# Patient Record
Sex: Female | Born: 1965 | Race: White | Hispanic: No | Marital: Married | Smoking: Never smoker
Health system: Southern US, Community
[De-identification: ages and names within clinical notes are randomized; demographics above are authoritative.]

## PROBLEM LIST (undated history)

## (undated) DIAGNOSIS — K2960 Other gastritis without bleeding: Secondary | ICD-10-CM

## (undated) DIAGNOSIS — T39395A Adverse effect of other nonsteroidal anti-inflammatory drugs [NSAID], initial encounter: Secondary | ICD-10-CM

## (undated) DIAGNOSIS — M654 Radial styloid tenosynovitis [de Quervain]: Secondary | ICD-10-CM

## (undated) DIAGNOSIS — R740 Nonspecific elevation of levels of transaminase and lactic acid dehydrogenase [LDH]: Secondary | ICD-10-CM

## (undated) DIAGNOSIS — K219 Gastro-esophageal reflux disease without esophagitis: Secondary | ICD-10-CM

## (undated) DIAGNOSIS — M797 Fibromyalgia: Secondary | ICD-10-CM

## (undated) DIAGNOSIS — F419 Anxiety disorder, unspecified: Secondary | ICD-10-CM

## (undated) DIAGNOSIS — M5136 Other intervertebral disc degeneration, lumbar region: Secondary | ICD-10-CM

## (undated) DIAGNOSIS — D509 Iron deficiency anemia, unspecified: Secondary | ICD-10-CM

## (undated) DIAGNOSIS — R609 Edema, unspecified: Secondary | ICD-10-CM

## (undated) DIAGNOSIS — A689 Relapsing fever, unspecified: Secondary | ICD-10-CM

## (undated) DIAGNOSIS — Z8669 Personal history of other diseases of the nervous system and sense organs: Secondary | ICD-10-CM

## (undated) DIAGNOSIS — M51369 Other intervertebral disc degeneration, lumbar region without mention of lumbar back pain or lower extremity pain: Secondary | ICD-10-CM

## (undated) DIAGNOSIS — E669 Obesity, unspecified: Secondary | ICD-10-CM

## (undated) DIAGNOSIS — R634 Abnormal weight loss: Secondary | ICD-10-CM

## (undated) DIAGNOSIS — R0789 Other chest pain: Secondary | ICD-10-CM

## (undated) DIAGNOSIS — K623 Rectal prolapse: Secondary | ICD-10-CM

## (undated) DIAGNOSIS — G4700 Insomnia, unspecified: Secondary | ICD-10-CM

## (undated) DIAGNOSIS — N3010 Interstitial cystitis (chronic) without hematuria: Secondary | ICD-10-CM

## (undated) DIAGNOSIS — K589 Irritable bowel syndrome without diarrhea: Secondary | ICD-10-CM

## (undated) DIAGNOSIS — E042 Nontoxic multinodular goiter: Secondary | ICD-10-CM

## (undated) DIAGNOSIS — M8580 Other specified disorders of bone density and structure, unspecified site: Secondary | ICD-10-CM

## (undated) DIAGNOSIS — J189 Pneumonia, unspecified organism: Secondary | ICD-10-CM

## (undated) DIAGNOSIS — K625 Hemorrhage of anus and rectum: Secondary | ICD-10-CM

## (undated) DIAGNOSIS — N390 Urinary tract infection, site not specified: Secondary | ICD-10-CM

## (undated) DIAGNOSIS — J181 Lobar pneumonia, unspecified organism: Secondary | ICD-10-CM

## (undated) DIAGNOSIS — R110 Nausea: Secondary | ICD-10-CM

## (undated) HISTORY — DX: Edema, unspecified: R60.9

## (undated) HISTORY — DX: Hemorrhage of anus and rectum: K62.5

## (undated) HISTORY — DX: Radial styloid tenosynovitis (de quervain): M65.4

## (undated) HISTORY — DX: Adverse effect of other nonsteroidal anti-inflammatory drugs (NSAID), initial encounter: T39.395A

## (undated) HISTORY — DX: Fibromyalgia: M79.7

## (undated) HISTORY — DX: Urinary tract infection, site not specified: N39.0

## (undated) HISTORY — DX: Interstitial cystitis (chronic) without hematuria: N30.10

## (undated) HISTORY — DX: Insomnia, unspecified: G47.00

## (undated) HISTORY — DX: Lobar pneumonia, unspecified organism: J18.1

## (undated) HISTORY — DX: Gastro-esophageal reflux disease without esophagitis: K21.9

## (undated) HISTORY — DX: Pneumonia, unspecified organism: J18.9

## (undated) HISTORY — DX: Abnormal weight loss: R63.4

## (undated) HISTORY — DX: Personal history of other diseases of the nervous system and sense organs: Z86.69

## (undated) HISTORY — DX: Rectal prolapse: K62.3

## (undated) HISTORY — DX: Nausea: R11.0

## (undated) HISTORY — DX: Anxiety disorder, unspecified: F41.9

## (undated) HISTORY — DX: Other gastritis without bleeding: K29.60

## (undated) HISTORY — DX: Relapsing fever, unspecified: A68.9

## (undated) HISTORY — DX: Other intervertebral disc degeneration, lumbar region: M51.36

## (undated) HISTORY — DX: Other intervertebral disc degeneration, lumbar region without mention of lumbar back pain or lower extremity pain: M51.369

## (undated) HISTORY — PX: ABDOMINAL HYSTERECTOMY: SHX81

## (undated) HISTORY — DX: Nontoxic multinodular goiter: E04.2

## (undated) HISTORY — DX: Iron deficiency anemia, unspecified: D50.9

## (undated) HISTORY — PX: BUNIONECTOMY: SHX129

## (undated) HISTORY — DX: Obesity, unspecified: E66.9

## (undated) HISTORY — PX: RECTAL PROLAPSE REPAIR, RECTOPEXY: SHX2311

## (undated) HISTORY — DX: Other chest pain: R07.89

## (undated) HISTORY — DX: Other specified disorders of bone density and structure, unspecified site: M85.80

## (undated) HISTORY — PX: BREAST BIOPSY: SHX20

## (undated) HISTORY — DX: Nonspecific elevation of levels of transaminase and lactic acid dehydrogenase (ldh): R74.0

---

## 2001-01-08 ENCOUNTER — Other Ambulatory Visit: Admission: RE | Admit: 2001-01-08 | Discharge: 2001-01-08 | Payer: Self-pay | Admitting: *Deleted

## 2001-08-06 HISTORY — PX: BILATERAL SALPINGOOPHORECTOMY: SHX1223

## 2001-11-27 ENCOUNTER — Inpatient Hospital Stay (HOSPITAL_COMMUNITY): Admission: RE | Admit: 2001-11-27 | Discharge: 2001-11-30 | Payer: Self-pay | Admitting: Obstetrics & Gynecology

## 2002-06-11 ENCOUNTER — Ambulatory Visit (HOSPITAL_COMMUNITY): Admission: RE | Admit: 2002-06-11 | Discharge: 2002-06-11 | Payer: Self-pay | Admitting: Pediatrics

## 2002-06-11 ENCOUNTER — Encounter: Payer: Self-pay | Admitting: Pediatrics

## 2002-06-12 ENCOUNTER — Ambulatory Visit (HOSPITAL_COMMUNITY): Admission: RE | Admit: 2002-06-12 | Discharge: 2002-06-12 | Payer: Self-pay | Admitting: Pediatrics

## 2002-06-12 ENCOUNTER — Encounter: Payer: Self-pay | Admitting: Pediatrics

## 2002-12-25 ENCOUNTER — Ambulatory Visit (HOSPITAL_COMMUNITY): Admission: RE | Admit: 2002-12-25 | Discharge: 2002-12-25 | Payer: Self-pay | Admitting: Pediatrics

## 2002-12-25 ENCOUNTER — Encounter: Payer: Self-pay | Admitting: Pediatrics

## 2002-12-31 ENCOUNTER — Encounter: Payer: Self-pay | Admitting: Obstetrics and Gynecology

## 2002-12-31 ENCOUNTER — Ambulatory Visit (HOSPITAL_COMMUNITY): Admission: RE | Admit: 2002-12-31 | Discharge: 2002-12-31 | Payer: Self-pay | Admitting: Obstetrics and Gynecology

## 2003-04-13 ENCOUNTER — Encounter (HOSPITAL_COMMUNITY): Admission: RE | Admit: 2003-04-13 | Discharge: 2003-05-13 | Payer: Self-pay | Admitting: Pediatrics

## 2003-06-07 ENCOUNTER — Encounter
Admission: RE | Admit: 2003-06-07 | Discharge: 2003-09-05 | Payer: Self-pay | Admitting: Physical Medicine & Rehabilitation

## 2003-08-07 HISTORY — PX: CHOLECYSTECTOMY: SHX55

## 2003-09-23 ENCOUNTER — Encounter
Admission: RE | Admit: 2003-09-23 | Discharge: 2003-12-22 | Payer: Self-pay | Admitting: Physical Medicine & Rehabilitation

## 2004-01-04 ENCOUNTER — Ambulatory Visit (HOSPITAL_COMMUNITY): Admission: AD | Admit: 2004-01-04 | Discharge: 2004-01-04 | Payer: Self-pay | Admitting: Obstetrics & Gynecology

## 2004-03-08 ENCOUNTER — Ambulatory Visit (HOSPITAL_COMMUNITY): Admission: RE | Admit: 2004-03-08 | Discharge: 2004-03-08 | Payer: Self-pay | Admitting: Family Medicine

## 2004-03-13 ENCOUNTER — Encounter (INDEPENDENT_AMBULATORY_CARE_PROVIDER_SITE_OTHER): Payer: Self-pay | Admitting: *Deleted

## 2004-03-13 ENCOUNTER — Inpatient Hospital Stay (HOSPITAL_COMMUNITY): Admission: RE | Admit: 2004-03-13 | Discharge: 2004-03-15 | Payer: Self-pay | Admitting: Internal Medicine

## 2004-03-29 ENCOUNTER — Encounter
Admission: RE | Admit: 2004-03-29 | Discharge: 2004-03-31 | Payer: Self-pay | Admitting: Physical Medicine & Rehabilitation

## 2004-04-07 ENCOUNTER — Ambulatory Visit (HOSPITAL_COMMUNITY)
Admission: RE | Admit: 2004-04-07 | Discharge: 2004-04-07 | Payer: Self-pay | Admitting: Physical Medicine & Rehabilitation

## 2004-06-06 ENCOUNTER — Ambulatory Visit (HOSPITAL_COMMUNITY): Admission: RE | Admit: 2004-06-06 | Discharge: 2004-06-06 | Payer: Self-pay | Admitting: Internal Medicine

## 2004-06-06 ENCOUNTER — Encounter (INDEPENDENT_AMBULATORY_CARE_PROVIDER_SITE_OTHER): Payer: Self-pay | Admitting: *Deleted

## 2004-08-06 HISTORY — PX: ORIF METATARSAL FRACTURE: SUR942

## 2004-12-29 ENCOUNTER — Other Ambulatory Visit: Admission: RE | Admit: 2004-12-29 | Discharge: 2004-12-29 | Payer: Self-pay | Admitting: Obstetrics & Gynecology

## 2005-02-23 ENCOUNTER — Ambulatory Visit (HOSPITAL_COMMUNITY): Admission: RE | Admit: 2005-02-23 | Discharge: 2005-02-23 | Payer: Self-pay | Admitting: Orthopedic Surgery

## 2005-04-02 ENCOUNTER — Ambulatory Visit (HOSPITAL_COMMUNITY): Admission: RE | Admit: 2005-04-02 | Discharge: 2005-04-02 | Payer: Self-pay | Admitting: Obstetrics & Gynecology

## 2005-04-02 ENCOUNTER — Emergency Department (HOSPITAL_COMMUNITY): Admission: EM | Admit: 2005-04-02 | Discharge: 2005-04-02 | Payer: Self-pay | Admitting: Emergency Medicine

## 2005-04-04 ENCOUNTER — Ambulatory Visit: Payer: Self-pay | Admitting: Orthopedic Surgery

## 2005-04-12 ENCOUNTER — Ambulatory Visit: Payer: Self-pay | Admitting: Orthopedic Surgery

## 2005-04-30 ENCOUNTER — Ambulatory Visit: Payer: Self-pay | Admitting: Orthopedic Surgery

## 2005-05-21 ENCOUNTER — Ambulatory Visit: Payer: Self-pay | Admitting: Orthopedic Surgery

## 2005-05-30 ENCOUNTER — Ambulatory Visit: Payer: Self-pay | Admitting: Orthopedic Surgery

## 2005-05-31 ENCOUNTER — Ambulatory Visit (HOSPITAL_COMMUNITY): Admission: RE | Admit: 2005-05-31 | Discharge: 2005-05-31 | Payer: Self-pay | Admitting: Orthopedic Surgery

## 2005-06-05 ENCOUNTER — Ambulatory Visit: Payer: Self-pay | Admitting: Orthopedic Surgery

## 2005-06-05 ENCOUNTER — Ambulatory Visit (HOSPITAL_COMMUNITY): Admission: RE | Admit: 2005-06-05 | Discharge: 2005-06-05 | Payer: Self-pay | Admitting: Orthopedic Surgery

## 2005-06-11 ENCOUNTER — Ambulatory Visit: Payer: Self-pay | Admitting: Orthopedic Surgery

## 2005-06-18 ENCOUNTER — Ambulatory Visit: Payer: Self-pay | Admitting: Orthopedic Surgery

## 2005-06-21 ENCOUNTER — Ambulatory Visit: Payer: Self-pay | Admitting: Orthopedic Surgery

## 2005-07-02 ENCOUNTER — Ambulatory Visit: Payer: Self-pay | Admitting: Orthopedic Surgery

## 2005-07-06 ENCOUNTER — Ambulatory Visit (HOSPITAL_COMMUNITY): Admission: RE | Admit: 2005-07-06 | Discharge: 2005-07-06 | Payer: Self-pay | Admitting: Orthopedic Surgery

## 2005-07-06 ENCOUNTER — Ambulatory Visit (HOSPITAL_COMMUNITY): Admission: RE | Admit: 2005-07-06 | Discharge: 2005-07-06 | Payer: Self-pay | Admitting: Obstetrics & Gynecology

## 2005-07-18 ENCOUNTER — Ambulatory Visit: Payer: Self-pay | Admitting: Orthopedic Surgery

## 2005-08-08 ENCOUNTER — Ambulatory Visit: Payer: Self-pay | Admitting: Orthopedic Surgery

## 2005-09-24 ENCOUNTER — Ambulatory Visit: Payer: Self-pay | Admitting: Orthopedic Surgery

## 2005-10-29 ENCOUNTER — Ambulatory Visit: Payer: Self-pay | Admitting: Orthopedic Surgery

## 2005-12-03 ENCOUNTER — Ambulatory Visit: Payer: Self-pay | Admitting: Orthopedic Surgery

## 2006-09-03 ENCOUNTER — Ambulatory Visit (HOSPITAL_COMMUNITY): Payer: Self-pay | Admitting: Psychiatry

## 2006-09-10 ENCOUNTER — Ambulatory Visit (HOSPITAL_COMMUNITY): Payer: Self-pay | Admitting: Psychiatry

## 2006-10-11 ENCOUNTER — Ambulatory Visit (HOSPITAL_COMMUNITY): Payer: Self-pay | Admitting: Psychiatry

## 2006-10-25 ENCOUNTER — Ambulatory Visit (HOSPITAL_COMMUNITY): Payer: Self-pay | Admitting: Psychiatry

## 2007-05-01 ENCOUNTER — Emergency Department (HOSPITAL_COMMUNITY): Admission: EM | Admit: 2007-05-01 | Discharge: 2007-05-01 | Payer: Self-pay | Admitting: Emergency Medicine

## 2007-05-19 ENCOUNTER — Emergency Department (HOSPITAL_COMMUNITY): Admission: EM | Admit: 2007-05-19 | Discharge: 2007-05-19 | Payer: Self-pay | Admitting: Family Medicine

## 2007-07-09 ENCOUNTER — Ambulatory Visit (HOSPITAL_COMMUNITY): Admission: RE | Admit: 2007-07-09 | Discharge: 2007-07-09 | Payer: Self-pay | Admitting: Family Medicine

## 2007-07-10 ENCOUNTER — Ambulatory Visit (HOSPITAL_COMMUNITY): Admission: RE | Admit: 2007-07-10 | Discharge: 2007-07-10 | Payer: Self-pay | Admitting: Family Medicine

## 2008-02-05 ENCOUNTER — Encounter: Admission: RE | Admit: 2008-02-05 | Discharge: 2008-02-05 | Payer: Self-pay | Admitting: Internal Medicine

## 2008-07-28 ENCOUNTER — Encounter: Payer: Self-pay | Admitting: Orthopedic Surgery

## 2008-07-28 ENCOUNTER — Ambulatory Visit: Payer: Self-pay | Admitting: Orthopedic Surgery

## 2008-07-28 DIAGNOSIS — M25539 Pain in unspecified wrist: Secondary | ICD-10-CM

## 2008-07-28 DIAGNOSIS — M7750 Other enthesopathy of unspecified foot: Secondary | ICD-10-CM | POA: Insufficient documentation

## 2008-08-11 ENCOUNTER — Telehealth: Payer: Self-pay | Admitting: Orthopedic Surgery

## 2008-08-12 ENCOUNTER — Encounter: Payer: Self-pay | Admitting: Orthopedic Surgery

## 2008-08-16 ENCOUNTER — Encounter: Payer: Self-pay | Admitting: Orthopedic Surgery

## 2008-08-16 ENCOUNTER — Telehealth: Payer: Self-pay | Admitting: Orthopedic Surgery

## 2008-08-19 ENCOUNTER — Ambulatory Visit: Payer: Self-pay | Admitting: Orthopedic Surgery

## 2008-12-02 ENCOUNTER — Emergency Department (HOSPITAL_COMMUNITY): Admission: EM | Admit: 2008-12-02 | Discharge: 2008-12-02 | Payer: Self-pay | Admitting: Family Medicine

## 2009-01-25 ENCOUNTER — Emergency Department (HOSPITAL_COMMUNITY): Admission: EM | Admit: 2009-01-25 | Discharge: 2009-01-25 | Payer: Self-pay | Admitting: Family Medicine

## 2009-03-25 ENCOUNTER — Encounter (INDEPENDENT_AMBULATORY_CARE_PROVIDER_SITE_OTHER): Payer: Self-pay | Admitting: Urology

## 2009-03-25 ENCOUNTER — Ambulatory Visit (HOSPITAL_BASED_OUTPATIENT_CLINIC_OR_DEPARTMENT_OTHER): Admission: RE | Admit: 2009-03-25 | Discharge: 2009-03-25 | Payer: Self-pay | Admitting: Urology

## 2009-06-14 ENCOUNTER — Emergency Department (HOSPITAL_COMMUNITY): Admission: EM | Admit: 2009-06-14 | Discharge: 2009-06-14 | Payer: Self-pay | Admitting: Family Medicine

## 2009-11-22 ENCOUNTER — Emergency Department (HOSPITAL_COMMUNITY): Admission: EM | Admit: 2009-11-22 | Discharge: 2009-11-22 | Payer: Self-pay | Admitting: Family Medicine

## 2010-03-06 DIAGNOSIS — R7401 Elevation of levels of liver transaminase levels: Secondary | ICD-10-CM

## 2010-03-06 HISTORY — DX: Elevation of levels of liver transaminase levels: R74.01

## 2010-03-23 ENCOUNTER — Encounter (INDEPENDENT_AMBULATORY_CARE_PROVIDER_SITE_OTHER): Payer: Self-pay | Admitting: *Deleted

## 2010-04-01 ENCOUNTER — Emergency Department (HOSPITAL_COMMUNITY): Admission: EM | Admit: 2010-04-01 | Discharge: 2010-04-01 | Payer: Self-pay | Admitting: Family Medicine

## 2010-04-24 ENCOUNTER — Ambulatory Visit: Payer: Self-pay | Admitting: Gastroenterology

## 2010-04-24 ENCOUNTER — Encounter (INDEPENDENT_AMBULATORY_CARE_PROVIDER_SITE_OTHER): Payer: Self-pay | Admitting: *Deleted

## 2010-05-02 ENCOUNTER — Encounter: Payer: Self-pay | Admitting: Family Medicine

## 2010-05-02 ENCOUNTER — Ambulatory Visit (HOSPITAL_COMMUNITY): Admission: RE | Admit: 2010-05-02 | Discharge: 2010-05-02 | Payer: Self-pay | Admitting: Orthopaedic Surgery

## 2010-05-06 DIAGNOSIS — K2960 Other gastritis without bleeding: Secondary | ICD-10-CM

## 2010-05-06 HISTORY — DX: Other gastritis without bleeding: K29.60

## 2010-05-31 ENCOUNTER — Telehealth: Payer: Self-pay | Admitting: Gastroenterology

## 2010-06-01 ENCOUNTER — Ambulatory Visit: Payer: Self-pay | Admitting: Gastroenterology

## 2010-06-01 ENCOUNTER — Ambulatory Visit (HOSPITAL_COMMUNITY): Admission: RE | Admit: 2010-06-01 | Discharge: 2010-06-01 | Payer: Self-pay | Admitting: Gastroenterology

## 2010-06-01 DIAGNOSIS — K625 Hemorrhage of anus and rectum: Secondary | ICD-10-CM

## 2010-06-01 DIAGNOSIS — K219 Gastro-esophageal reflux disease without esophagitis: Secondary | ICD-10-CM

## 2010-06-01 HISTORY — DX: Hemorrhage of anus and rectum: K62.5

## 2010-06-01 HISTORY — DX: Gastro-esophageal reflux disease without esophagitis: K21.9

## 2010-06-01 LAB — HM COLONOSCOPY

## 2010-06-08 ENCOUNTER — Encounter: Payer: Self-pay | Admitting: Family Medicine

## 2010-07-10 ENCOUNTER — Ambulatory Visit: Payer: Self-pay | Admitting: Family Medicine

## 2010-07-10 DIAGNOSIS — R740 Nonspecific elevation of levels of transaminase and lactic acid dehydrogenase [LDH]: Secondary | ICD-10-CM

## 2010-07-10 DIAGNOSIS — R7402 Elevation of levels of lactic acid dehydrogenase (LDH): Secondary | ICD-10-CM | POA: Insufficient documentation

## 2010-07-10 DIAGNOSIS — M797 Fibromyalgia: Secondary | ICD-10-CM

## 2010-07-10 DIAGNOSIS — K219 Gastro-esophageal reflux disease without esophagitis: Secondary | ICD-10-CM | POA: Insufficient documentation

## 2010-07-10 DIAGNOSIS — F4321 Adjustment disorder with depressed mood: Secondary | ICD-10-CM

## 2010-07-10 DIAGNOSIS — R5382 Chronic fatigue, unspecified: Secondary | ICD-10-CM

## 2010-07-10 DIAGNOSIS — G9332 Myalgic encephalomyelitis/chronic fatigue syndrome: Secondary | ICD-10-CM | POA: Insufficient documentation

## 2010-07-10 DIAGNOSIS — G2581 Restless legs syndrome: Secondary | ICD-10-CM | POA: Insufficient documentation

## 2010-07-10 DIAGNOSIS — F411 Generalized anxiety disorder: Secondary | ICD-10-CM | POA: Insufficient documentation

## 2010-07-12 ENCOUNTER — Telehealth: Payer: Self-pay | Admitting: Family Medicine

## 2010-07-19 ENCOUNTER — Telehealth (INDEPENDENT_AMBULATORY_CARE_PROVIDER_SITE_OTHER): Payer: Self-pay | Admitting: *Deleted

## 2010-08-04 ENCOUNTER — Telehealth (INDEPENDENT_AMBULATORY_CARE_PROVIDER_SITE_OTHER): Payer: Self-pay | Admitting: *Deleted

## 2010-08-04 ENCOUNTER — Ambulatory Visit (HOSPITAL_COMMUNITY)
Admission: RE | Admit: 2010-08-04 | Discharge: 2010-08-04 | Payer: Self-pay | Source: Home / Self Care | Attending: Orthopaedic Surgery | Admitting: Orthopaedic Surgery

## 2010-08-04 ENCOUNTER — Ambulatory Visit
Admission: RE | Admit: 2010-08-04 | Discharge: 2010-08-04 | Payer: Self-pay | Source: Home / Self Care | Attending: Family Medicine | Admitting: Family Medicine

## 2010-08-04 ENCOUNTER — Encounter: Payer: Self-pay | Admitting: Family Medicine

## 2010-08-04 DIAGNOSIS — F329 Major depressive disorder, single episode, unspecified: Secondary | ICD-10-CM

## 2010-08-04 LAB — CONVERTED CEMR LAB
Glucose, Urine, Semiquant: NEGATIVE
Nitrite: NEGATIVE
Specific Gravity, Urine: 1.015
Urobilinogen, UA: 1
pH: 6

## 2010-08-15 ENCOUNTER — Telehealth: Payer: Self-pay | Admitting: Family Medicine

## 2010-08-16 ENCOUNTER — Encounter: Payer: Self-pay | Admitting: Family Medicine

## 2010-08-16 ENCOUNTER — Telehealth: Payer: Self-pay | Admitting: Family Medicine

## 2010-08-16 DIAGNOSIS — R634 Abnormal weight loss: Secondary | ICD-10-CM | POA: Insufficient documentation

## 2010-08-16 DIAGNOSIS — R5382 Chronic fatigue, unspecified: Secondary | ICD-10-CM | POA: Insufficient documentation

## 2010-08-16 DIAGNOSIS — A689 Relapsing fever, unspecified: Secondary | ICD-10-CM | POA: Insufficient documentation

## 2010-08-17 ENCOUNTER — Encounter: Payer: Self-pay | Admitting: Family Medicine

## 2010-08-22 ENCOUNTER — Ambulatory Visit (HOSPITAL_COMMUNITY)
Admission: RE | Admit: 2010-08-22 | Discharge: 2010-08-22 | Payer: Self-pay | Source: Home / Self Care | Attending: Family Medicine | Admitting: Family Medicine

## 2010-08-22 ENCOUNTER — Encounter: Payer: Self-pay | Admitting: Family Medicine

## 2010-08-22 LAB — HM MAMMOGRAPHY: HM Mammogram: NORMAL

## 2010-08-25 ENCOUNTER — Telehealth (INDEPENDENT_AMBULATORY_CARE_PROVIDER_SITE_OTHER): Payer: Self-pay | Admitting: *Deleted

## 2010-08-26 ENCOUNTER — Encounter: Payer: Self-pay | Admitting: Obstetrics & Gynecology

## 2010-08-27 ENCOUNTER — Encounter: Payer: Self-pay | Admitting: Orthopedic Surgery

## 2010-08-27 ENCOUNTER — Encounter: Payer: Self-pay | Admitting: Obstetrics & Gynecology

## 2010-08-27 ENCOUNTER — Encounter: Payer: Self-pay | Admitting: Family Medicine

## 2010-08-27 ENCOUNTER — Encounter: Payer: Self-pay | Admitting: Internal Medicine

## 2010-08-29 ENCOUNTER — Encounter: Payer: Self-pay | Admitting: Family Medicine

## 2010-08-29 ENCOUNTER — Ambulatory Visit (HOSPITAL_COMMUNITY): Admit: 2010-08-29 | Payer: Self-pay | Admitting: Psychiatry

## 2010-08-29 ENCOUNTER — Telehealth (INDEPENDENT_AMBULATORY_CARE_PROVIDER_SITE_OTHER): Payer: Self-pay | Admitting: *Deleted

## 2010-08-31 ENCOUNTER — Ambulatory Visit (HOSPITAL_COMMUNITY): Admit: 2010-08-31 | Payer: Self-pay | Admitting: Psychiatry

## 2010-08-31 ENCOUNTER — Telehealth: Payer: Self-pay | Admitting: Family Medicine

## 2010-08-31 ENCOUNTER — Encounter: Payer: Self-pay | Admitting: Family Medicine

## 2010-09-01 ENCOUNTER — Telehealth (INDEPENDENT_AMBULATORY_CARE_PROVIDER_SITE_OTHER): Payer: Self-pay | Admitting: *Deleted

## 2010-09-01 ENCOUNTER — Encounter: Payer: Self-pay | Admitting: Family Medicine

## 2010-09-01 LAB — CONVERTED CEMR LAB
BUN: 5 mg/dL — ABNORMAL LOW (ref 6–23)
CO2: 24 meq/L (ref 19–32)
Calcium: 8.9 mg/dL (ref 8.4–10.5)
HCT: 36.4 % (ref 36.0–46.0)
Hemoglobin: 11.6 g/dL — ABNORMAL LOW (ref 12.0–15.0)
Lymphocytes Relative: 17 % (ref 12–46)
Lymphs Abs: 1.7 10*3/uL (ref 0.7–4.0)
MCHC: 31.9 g/dL (ref 30.0–36.0)
Neutrophils Relative %: 77 % (ref 43–77)
Osmolality: 273 mOsm/kg — ABNORMAL LOW (ref 275–300)
RBC: 4.09 M/uL (ref 3.87–5.11)
WBC: 9.9 10*3/uL (ref 4.0–10.5)

## 2010-09-04 ENCOUNTER — Encounter: Payer: Self-pay | Admitting: Family Medicine

## 2010-09-04 ENCOUNTER — Ambulatory Visit
Admission: RE | Admit: 2010-09-04 | Discharge: 2010-09-04 | Payer: Self-pay | Source: Home / Self Care | Attending: Family Medicine | Admitting: Family Medicine

## 2010-09-05 NOTE — Letter (Signed)
Summary: [facility] Instructions  [facility]  [address] [place], [place] [address]   Phone: [phone]  Fax: [phone]       Alexandra Reid    [birth year]    MRN: [id number]        Procedure Day /Date:06/01/10 THURS     Arrival Time:730 am     Procedure Time:930 am     Location of Procedure:                     X  [facility] ( Outpatient Registration)                        PREPARATION FOR COLONOSCOPY WITH MOVIPREP   Starting 5 days prior to your procedure 05/27/10 do not eat nuts, seeds, popcorn, corn, beans, peas,  salads, or any raw vegetables.  Do not take any fiber supplements (e.g. Metamucil, Citrucel, and Benefiber).  THE DAY BEFORE YOUR PROCEDURE         DATE: 05/31/10  DAY: WED  1.  Drink clear liquids the entire day-NO SOLID FOOD  2.  Do not drink anything colored red or purple.  Avoid juices with pulp.  No orange juice.  3.  Drink at least 64 oz. (8 glasses) of fluid/clear liquids during the day to prevent dehydration and help the prep work efficiently.  CLEAR LIQUIDS INCLUDE: Water Jello Ice Popsicles Tea (sugar ok, no milk/cream) Powdered fruit flavored drinks Coffee (sugar ok, no milk/cream) Gatorade Juice: apple, white grape, white cranberry  Lemonade Clear bullion, consomm, broth Carbonated beverages (any kind) Strained chicken noodle soup Hard Candy                             4.  In the morning, mix first dose of MoviPrep solution:    Empty 1 Pouch A and 1 Pouch B into the disposable container    Add lukewarm drinking water to the top line of the container. Mix to dissolve    Refrigerate (mixed solution should be used within 24 hrs)  5.  Begin drinking the prep at 5:00 p.m. The MoviPrep container is divided by 4 marks.   Every 15 minutes drink the solution down to the next mark (approximately 8 oz) until the full liter is complete.   6.  Follow completed prep with 16 oz of clear liquid of your choice  (Nothing red or purple).  Continue to drink clear liquids until bedtime.  7.  Before going to bed, mix second dose of MoviPrep solution:    Empty 1 Pouch A and 1 Pouch B into the disposable container    Add lukewarm drinking water to the top line of the container. Mix to dissolve    Refrigerate  THE DAY OF YOUR PROCEDURE      DATE: 06/01/10 DAY: THURS  Beginning at 430 a.m. (5 hours before procedure):         1. Every 15 minutes, drink the solution down to the next mark (approx 8 oz) until the full liter is complete.  NOTHING TO EAT OR DRINK AFTER MIDNIGHT  MEDICATION INSTRUCTIONS  Unless otherwise instructed, you should take regular prescription medications with a small sip of water   as early as possible the morning of your procedure.         OTHER INSTRUCTIONS  You will need a responsible adult at least 46 years of age  to accompany you and drive you home.   This person must remain in the waiting room during your procedure.  Wear loose fitting clothing that is easily removed.  Leave jewelry and other valuables at home.  However, you may wish to bring a book to read or  an iPod/MP3 player to listen to music as you wait for your procedure to start.  Remove all body piercing jewelry and leave at home.  Total time from sign-in until discharge is approximately 2-3 hours.  You should go home directly after your procedure and rest.  You can resume normal activities the  day after your procedure.  The day of your procedure you should not:   Drive   Make legal decisions   Operate machinery   Drink alcohol   Return to work  You will receive specific instructions about eating, activities and medications before you leave.    The above instructions have been reviewed and explained to me by   _______________________    I fully understand and can verbalize these instructions _____________________________ Date _________  Appended Document: Moviprep  Instructions    Clinical Lists Changes  Medications: Removed medication of NABUMETONE 500 MG TABS (NABUMETONE) one by mouth bid

## 2010-09-05 NOTE — Letter (Signed)
Summary: New Patient letter  [facility]  [address] [place], [place] [address]   Phone: [phone]  Fax: [phone]       03/23/2010 MRN: [id number]  [facility] [address] [place], [place]  [address]  Dear Ms. Alexandra Reid,  Welcome to the Gastroenterology Division at [facility].    You are scheduled to see Dr.  Rob Bunting on April 24, 2010 at 10:30am on the 3rd floor at [facility], [address]  We ask that you try to arrive at our office 15 minutes prior to your appointment time to allow for check-in.  We would like you to complete the enclosed self-administered evaluation form prior to your visit and bring it with you on the day of your appointment.  We will review it with you.  Also, please bring a complete list of all your medications or, if you prefer, bring the medication bottles and we will list them.  Please bring your insurance card so that we may make a copy of it.  If your insurance requires a referral to see a specialist, please bring your referral form from your primary care physician.  Co-payments are due at the time of your visit and may be paid by cash, check or credit card.     Your office visit will consist of a consult with your physician (includes a physical exam), any laboratory testing he/she may order, scheduling of any necessary diagnostic testing (e.g. x-ray, ultrasound, CT-scan), and scheduling of a procedure (e.g. Endoscopy, Colonoscopy) if required.  Please allow enough time on your schedule to allow for any/all of these possibilities.    If you cannot keep your appointment, please call [phone] to cancel or reschedule prior to your appointment date.  This allows [place] the opportunity to schedule an appointment for another patient in need of care.  If you do not cancel or reschedule by 5 p.m. the business day prior to your appointment date, you will be charged a $50.00 late cancellation/no-show fee.    Thank you for  choosing [facility] for your medical needs.  We appreciate the opportunity to care for you.  Please visit [place] at our website  to learn more about our practice.                     Sincerely,                                                             The Gastroenterology Division
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
## 2010-09-05 NOTE — Procedures (Signed)
Summary: Colonoscopy  Patient: Alexandra Reid Note: All result statuses are Final unless otherwise noted.  Tests: (1) Colonoscopy (COL)   COL Colonoscopy           DONE     [facility]     [address] [place], [place]  [address]           COLONOSCOPY PROCEDURE REPORT           PATIENT:  Alexandra, Reid  MR#:  [id number]     BIRTHDATE:  March 01, 1966, 44 yrs. old  GENDER:  female     ENDOSCOPIST:  Rachael Fee, MD     REF. BY:  Lucky Cowboy, M.D.     PROCEDURE DATE:  06/01/2010     PROCEDURE:  Diagnostic Colonoscopy     ASA CLASS:  Class II     INDICATIONS:  minor intermittent rectal bleeding, not anemic     MEDICATIONS:   MAC sedation, administered by CRNA           DESCRIPTION OF PROCEDURE:   After the risks benefits and     alternatives of the procedure were thoroughly explained, informed     consent was obtained.  Digital rectal exam was performed and     revealed no abnormalities.   The Pentax Colonoscope [id number]     endoscope was introduced through the anus and advanced to the     cecum, which was identified by both the appendix and ileocecal     valve, without limitations.  The quality of the prep was good,     using MoviPrep.  The instrument was then slowly withdrawn as the     colon was fully examined.     <<PROCEDUREIMAGES>>           FINDINGS:  Internal hemorrhoids were found. These were small and     not thrombosed.  This was otherwise a normal examination of the     colon (see image001, image002, and image003).   Retroflexed views     in the rectum revealed no abnormalities.    The scope was then     withdrawn from the patient and the procedure completed.           COMPLICATIONS:  None           ENDOSCOPIC IMPRESSION:     1) Small internal hemorrhoids     2) Otherwise normal examination; no polyps or cancers           RECOMMENDATIONS:     1) Continue current colorectal screening recommendations for     "routine risk" patients with a repeat  colonoscopy in 10 years.     2) Bleeding is minor and most likely hemorrhoidal.           REPEAT EXAM:  10 years           ______________________________     Rachael Fee, MD           n.     eSIGNED:   Rachael Fee at 06/01/2010 09:40 AM           Alexandra Reid, [id number]  Note: An exclamation mark (!) indicates a result that was not dispersed into the flowsheet. Document Creation Date: 06/01/2010 9:40 AM _______________________________________________________________________  (1) Order result status: Final Collection or observation date-time: 06/01/2010 09:34 Requested date-time:  Receipt date-time:  Reported date-time:  Referring Physician:   Ordering Physician: Rob Bunting [phone]) Specimen  Source:  Source: Launa Grill Order Number: [id number] Lab site:   Appended Document: Colonoscopy patty, she needs recall colonoscoyp in 10 years  Appended Document: Colonoscopy recall in IDX and EMR   Clinical Lists Changes  Observations: Added new observation of COLONNXTDUE: 05/2020 (06/01/2010 10:20)

## 2010-09-05 NOTE — Procedures (Signed)
Summary: Preparation for Colonoscopy / [facility]  Preparation for Colonoscopy / [facility]   Imported By: Lennie Odor 04/25/2010 14:59:20  _____________________________________________________________________  External Attachment:    Type:   Image     Comment:   External Document
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
## 2010-09-05 NOTE — Procedures (Signed)
Summary: EGD  ENDOSCOPY PROCEDURE REPORT  PATIENT:  Alexandra Reid, Alexandra Reid  MR#:  [id number] BIRTHDATE:   Jan 08, 1966, 44 yrs. old   GENDER:   female ENDOSCOPIST:   Rachael Fee, MD PROCEDURE DATE:  06/01/2010 PROCEDURE:  EGD with biopsy, 43239 ASA CLASS:   Class II INDICATIONS: chronic GERD, under better control since changing to nexium BID shortly before BF and dinner meals (able to stop QHS pepcid) MEDICATIONS:   MAC sedation, administered by CRNA TOPICAL ANESTHETIC:   Vicous Xylocaine  DESCRIPTION OF PROCEDURE:   After the risks benefits and alternatives of the procedure were thoroughly explained, informed consent was obtained.  The  endoscope was introduced through the mouth and advanced to the second portion of the duodenum, without limitations.  The instrument was slowly withdrawn as the mucosa was fully examined.  <<PROCEDUREIMAGES>>          <<OLD IMAGES>> There was moderate, linear, spoke-like gastritis particularly in body of esophagus. More consistent with NSAIDs than GAVE. Biopsies taken to check for H. pylori (see image4).  Otherwise the examination was normal (see image7, image5, image3, image2, and image1).    Retroflexed views revealed no abnormalities.    The scope was then withdrawn from the patient and the procedure completed. COMPLICATIONS:   None  ENDOSCOPIC IMPRESSION:  1) Moderate gastritis, checked for H. pylori.  I suspect NSAID related changes.  2) Otherwise normal examination.  RECOMMENDATIONS:  If biopsies show H. pylori, she will be started on the appropriate antibiotics.  Try to avoid NSAIDs as best as possible.  Continue nexium twice daily since it is helping your symptoms.   _______________________________ Rachael Fee, MD

## 2010-09-05 NOTE — Progress Notes (Signed)
Summary: Triage  Phone Note Call from Patient Call back at [phone]   Caller: Patient Call For: Dr. Christella Hartigan Reason for Call: Talk to Nurse Summary of Call: pt. thought her COL was today instead of tomorrow at the hosp...Marland KitchenMarland KitchenShe has done the prep/Moviprep last night and this morning.Marland Kitchen...what does she need to do? Initial call taken by: Karna Christmas,  May 31, 2010 8:14 AM  Follow-up for Phone Call        Dr Christella Hartigan please advise.  Consulted Dr Christella Hartigan she is to use 1/2 of a Miralax prep tonight.  Pt aware and sent to pharmacy Follow-up by: Chales Abrahams CMA Duncan Dull),  May 31, 2010 8:25 AM  Additional Follow-up for Phone Call Additional follow up Details #1::        ok Additional Follow-up by: Rachael Fee MD,  May 31, 2010 8:46 AM    New/Updated Medications: MIRALAX   POWD (POLYETHYLENE GLYCOL 3350) as directed pt to use only 1/2 the miralax. Prescriptions: MIRALAX   POWD (POLYETHYLENE GLYCOL 3350) as directed pt to use only 1/2 the miralax.  #255gm x 0   Entered by:   Chales Abrahams CMA (AAMA)   Authorized by:   Rachael Fee MD   Signed by:   Rachael Fee MD on 05/31/2010   Method used:   Electronically to        [facility]  [address] [address] [id number] (retail)       [address]       [place], [place]  [address]       Ph: [phone] or [phone]       Fax: [phone]   RxID:   [id number]

## 2010-09-07 ENCOUNTER — Telehealth: Payer: Self-pay | Admitting: Family Medicine

## 2010-09-07 NOTE — Miscellaneous (Signed)
  Clinical Lists Changes     MLI_PICT  LTDinsletter To whom it may concern,           As Alexandra Reid primary care physician, I have been asked by her to write a brief statement to update her medical situation.  She has been suffering from unexplained febrile illnesses every 7-10d, characterized by said fever, increased body pain, headaches, and extreme fatigue.  These periods incapacitate her for an average of 3 days.  We have a reasonably high suspicion of malignancy so we are pursuing a medical investigation accordingly.      Sincerely,   Santiago Bumpers, MD
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
## 2010-09-07 NOTE — Progress Notes (Signed)
Summary: OV Notes to insurance co  Phone Note Call from Patient Call back at [phone]   Caller: Patient Summary of Call: Pls fax today's OV notes to her insurance co Initial call taken by: Lannette Donath,  August 04, 2010 3:07 PM  Follow-up for Phone Call        OV note completed and notes faxed to [facility]--fax # [phone]  Follow-up by: Francee Piccolo CMA (AAMA),  August 08, 2010 11:09 AM

## 2010-09-07 NOTE — Miscellaneous (Signed)
  Clinical Lists Changes  Problems: Added new problem of HYPONATREMIA (ICD-276.1) Orders: Added new Test order of T-Basic Metabolic Panel [phone]) - Signed Added new Test order of T-CBC w/Diff [phone]) - Signed Added new Test order of T-Culture, Blood Routine ([id number]) - Signed Added new Test order of T-Blood Osmolality ([id number]) - Signed Added new Test order of T-Blood Smear Interpretation ([id number]) - Signed Added new Service order of (3170F) FLOW CYTOMETRY STUDIES PERF AT DX OR PRIOR TO TX ([id number]) - Signed Added new Test order of T-Culture, Blood Routine ([id number]) - Signed

## 2010-09-07 NOTE — Letter (Signed)
Summary: [facility]   Imported By: Lester  07/27/2010 09:41:54  _____________________________________________________________________  External Attachment:    Type:   Image     Comment:   External Document

## 2010-09-07 NOTE — Progress Notes (Signed)
Summary: Labwork  Phone Note Call from Patient Call back at Home Phone [phone]   Caller: Patient Reason for Call: Talk to Nurse Summary of Call: Pt said she is sick today, she wants to know if this would be a good day to go have her labwork done, pls call Initial call taken by: Lannette Donath,  August 29, 2010 1:54 PM  Follow-up for Phone Call        pt has fever 102.6, she had to cancel appt with behavioral health until Thursday. pt wants to know if a fever would have any effect on lab results and if today would be a good day to have them drawn.  Please advise. Follow-up by: Francee Piccolo CMA Duncan Dull),  August 29, 2010 3:08 PM  Additional Follow-up for Phone Call Additional follow up Details #1::        Go ahead and get labs if she feels like she can make it to the lab.  If she feels too bad, it is fine to hold off until feeling better/no fever. Additional Follow-up by: Michell Heinrich M.D.,  August 29, 2010 3:14 PM    Additional Follow-up for Phone Call Additional follow up Details #2::    pt notified could be beneficial to go today.  She will go today. Follow-up by: Francee Piccolo CMA Duncan Dull),  August 29, 2010 3:48 PM

## 2010-09-07 NOTE — Progress Notes (Signed)
  Phone Note Other Incoming   Summary of Call: Talked to Dr. Consuella Lose on phone today regarding recent labs (08/04/10)--ESR 40 and CK total 356, o/w normal---and she can't explain the CK elevation but thinks the fever&malaise episodes + increased ESR warrant further w/u for malignancy.  Regarding the CK, she plans to repeat this in 8mo and if still up they will do EMG and NCS.  Will contact pt and discuss further w/u for malignancy. Initial call taken by: Michell Heinrich M.D.,  August 15, 2010 3:54 PM

## 2010-09-07 NOTE — Letter (Signed)
Summary: Generic Letter  [facility]  [address]   [place], [place] [address]   Phone: [phone]  Fax: [phone]    08/17/10  To whom it may concern,           As Alexandra Reid primary care physician, I have been asked by her to write a brief statement to update her medical situation.  She has been suffering from unexplained febrile illnesses every 7-10d, characterized by said fever, increased body pain, headaches, and extreme fatigue.  These periods incapacitate her for an average of 3 days.  We have a reasonably high suspicion of malignancy so we are pursuing a medical investigation accordingly.08/17/2010   Sincerely,   Nicoletta Ba M.D.

## 2010-09-07 NOTE — Progress Notes (Signed)
Summary: Lab results  Phone Note Other Incoming   Summary of Call: I talked to her about her latest lab results.  I want her to go back to [facility] for more labs ASAP: I'll print out order: BMET, serum osmolality, CBC w/diff, peripheral blood smear, flow cytometry, and blood culture x 2.  Dx: fever of unknown origin, hyponatremia. Initial call taken by: Michell Heinrich M.D.,  August 31, 2010 10:28 AM  Follow-up for Phone Call        lab orders faxed to [phone]. Follow-up by: Francee Piccolo CMA Duncan Dull),  August 31, 2010 10:40 AM

## 2010-09-07 NOTE — Miscellaneous (Signed)
  Clinical Lists Changes  Observations: Added new observation of FAMILY HX: grandfather had colon cancer Father: testicular cancer,  Mother: Picks disease (dementia) (08/22/2010 15:47) Added new observation of PAST MED HX: Lumbar DDD Chronic Headaches Anxiety Disorder Fibromyalgia (Dr. Consuella Lose [phone]) Interstitial Cystitis obesity Insomnia Mild transaminasemia 03/2010 (ALT 49), normal on f/u testing off of daily tylenol use. Dyspepsia/gastritis/GERD: EGD 06/01/2010: gastritis (NSAIDs) Anorectal bleeding/BRBPR--Colonoscopy 06/01/2010: internal hemorrhoids Recurrent UTIs: renal u/s normal 07/2007 Suspicion of malignancy 08/2010 based on approx 1-2 mo of wt loss, intermittent episodes of fever and worsened musculoskeletal pain, elevated ESR and otherwise negative autoimmune/inflammitory testing:  CT abd/pelv 08/22/10  (tiny right lung nodules x 3, mild biliary dilatation c/w physiologic change s/p cholecystectomy.  Per radiologist, f/u of lung nodules NOT recommended since she is NOT high risk for lung cancer.) CXR 08/22/10 nl except mild chronic bronchitic changes   (08/22/2010 15:47) Added new observation of PRIMARY MD: Aneta Mins McGowen,MD (08/22/2010 15:47)       Past History:  Past Medical History: Lumbar DDD Chronic Headaches Anxiety Disorder Fibromyalgia (Dr. Consuella Lose [phone]) Interstitial Cystitis obesity Insomnia Mild transaminasemia 03/2010 (ALT 49), normal on f/u testing off of daily tylenol use. Dyspepsia/gastritis/GERD: EGD 06/01/2010: gastritis (NSAIDs) Anorectal bleeding/BRBPR--Colonoscopy 06/01/2010: internal hemorrhoids Recurrent UTIs: renal u/s normal 07/2007 Suspicion of malignancy 08/2010 based on approx 1-2 mo of wt loss, intermittent episodes of fever and worsened musculoskeletal pain, elevated ESR and otherwise negative autoimmune/inflammitory testing:  CT abd/pelv 08/22/10  (tiny right lung nodules x 3, mild biliary dilatation c/w physiologic  change s/p cholecystectomy.  Per radiologist, f/u of lung nodules NOT recommended since she is NOT high risk for lung cancer.) CXR 08/22/10 nl except mild chronic bronchitic changes   Family History: grandfather had colon cancer Father: testicular cancer,  Mother: Picks disease (dementia)

## 2010-09-07 NOTE — Progress Notes (Signed)
Summary: Labs  Phone Note Other Incoming   Summary of Call: Please call [facility] in [place] and find out exactly what labs are already ordered for [facility] (I know that lyme, EBV, and parvo titers are ordered, but want to make sure CMET, CPK total, CBC w/diff, ESR, and lipase are also going to be done). Initial call taken by: Michell Heinrich M.D.,  August 25, 2010 8:17 AM  Follow-up for Phone Call        LM to [facility] at home number Francee Piccolo CMA Duncan Dull)  August 25, 2010 4:05 PM  pt is going for labs on Tuesday.  Will need order faxed for all labs you are requesting.  [facility] from [facility]. Follow-up by: Francee Piccolo CMA Duncan Dull),  August 25, 2010 4:13 PM  Additional Follow-up for Phone Call Additional follow up Details #1::        Printed labs. Additional Follow-up by: Michell Heinrich M.D.,  August 25, 2010 4:38 PM    Additional Follow-up for Phone Call Additional follow up Details #2::    lab orders faxed to [facility] Follow-up by: Francee Piccolo CMA Duncan Dull),  August 28, 2010 3:41 PM
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
## 2010-09-07 NOTE — Letter (Signed)
Summary: [facility]   Imported By: Lester [place] 07/27/2010 09:40:46  _____________________________________________________________________  External Attachment:    Type:   Image     Comment:   External Document

## 2010-09-07 NOTE — Progress Notes (Signed)
Summary: Pt needs notes & labs for life insurance policy  Phone Note Call from Patient   Caller: Patient Summary of Call: Pt needs medical history sent to her insurance co, pt is requesting most recent OV notes and Oct 2011 OV notes & labs to [facility] fax [phone] Cecil Cobbs , pt has filled out a release form for the insurance co, pt will request insurance co to fax it to our office Initial call taken by: Lannette Donath,  July 19, 2010 4:12 PM  Follow-up for Phone Call        I spoke with pt and informed her that I would fax the ov notes from the December appt but she would need to contact the other office that Dr Milinda Cave was at for the October visit and labs. Pt states she understands. Follow-up by: Josph Macho RMA,  July 19, 2010 4:20 PM  Additional Follow-up for Phone Call Additional follow up Details #1::        Pt informed that MD printed paperwork for pt to pick up. Pt states she will wait until her appt in January. Additional Follow-up by: Josph Macho RMA,  July 21, 2010 7:50 AM     Appended Document: Pt needs notes & labs for life insurance policy Left mess on pt's home # to [employer], Dr Milinda Cave needs medical records from Dr Magnus Ivan before life insurance forms can be sent, pt can either come by [facility] to sign medical record request or can contact Dr Eliberto Ivory office directly  Appended Document: Pt needs notes & labs for life insurance policy SW pt, she has already requested Dr Eliberto Ivory office to send the records directly to the insurance co, advised pt that Dr Milinda Cave also needs records for Dr Eliberto Ivory office, she will contact Dr Eliberto Ivory office to request records  Appended Document: Pt needs notes & labs for life insurance policy Faxed OV notes & cover letter to [employer] [phone]

## 2010-09-07 NOTE — Letter (Signed)
Summary: [facility] records  [facility] records   Imported By: Lester Tibbie 08/08/2010 07:59:46  _____________________________________________________________________  External Attachment:    Type:   Image     Comment:   External Document

## 2010-09-07 NOTE — Progress Notes (Signed)
Summary: Lab orders, Ins Letter  Phone Note Other Incoming   Summary of Call: Please arrange a mammogram (screening), chest x-ray, and CT of abdomen and pelvis with contrast, all on same day at same place preferably (either [facility] in [place] OR in [place]).  Dx: fever, weight loss, fatigue, and elevated ESR.  I talked to her today and told her we'd be arranging these. Also, she had her blood drawn yesterday at [facility] in --Dr. Consuella Lose was the ordering MD.  Can you call the lab and ask if I can add on a test?  I want to add Lyme titers (IgG and IgM, reflex) with dx of fatigue. Initial call taken by: Michell Heinrich M.D.,  August 16, 2010 11:28 AM  Follow-up for Phone Call        Lyme titers added w/Nesia at [facility].  Mammogram, and CT scheduled w/Joncie at [facility] to take place on 08/22/10 @ 10 am.  Order faxed and mailed for xray as pt will be considered a walk-in for this exam.  Pt is notified of above. Also, pt requests a letter to be faxed [phone]) to [employer] stating the following: 1-What we suspect is pt diagnosis. 2-Recurrent fever every 7-10 days with average "down time" of 2-4 days. 3-Increased migraines due to fever.  Notified pt [facility] # [phone] Follow-up by: Francee Piccolo CMA Duncan Dull),  August 16, 2010 5:03 PM  Additional Follow-up for Phone Call Additional follow up Details #1::        Ok.  Will write letter Thank you.  New Problems: OTHER SCREENING MAMMOGRAM (ICD-V76.12) FATIGUE (ICD-780.79) ESR, ELEVATED (ICD-790.1) FEVER, RECURRENT (ICD-087.9) WEIGHT LOSS (ICD-783.21)   Additional Follow-up for Phone Call Additional follow up Details #2::    letter faxed to [facility], orders faxed to [facility], CXR order mailed and faxed to patient on 08/16/10.  New Problems: OTHER SCREENING MAMMOGRAM (ICD-V76.12) FATIGUE (ICD-780.79) ESR, ELEVATED (ICD-790.1) FEVER, RECURRENT (ICD-087.9) WEIGHT LOSS (ICD-783.21)

## 2010-09-07 NOTE — Progress Notes (Signed)
Summary: lab restuls  Phone Note Other Incoming   Summary of Call: Results of labs back so far look really good: her sodium is back up to 133, which is basically normal.  Also, her CBC and peripheral blood smear were normal ! Reassure her that we'll call with any other results when they come in.  Tell her to hold off on taking anymore lasix for right now.  We may decide she can retry this in the near future, but I'll give the go-ahead first.  Thx Initial call taken by: Michell Heinrich M.D.,  September 01, 2010 1:07 PM  Follow-up for Phone Call        notified pt of above.  She is agreeable.  Pt has appt on Monday, 1/30.  I will call Dr. Fatima Sanger office for other lab results. Follow-up by: Francee Piccolo CMA Duncan Dull),  September 01, 2010 5:38 PM

## 2010-09-07 NOTE — Progress Notes (Signed)
  Phone Note Call from Patient Call back at [phone]   Caller: Patient Summary of Call: Patient wants to know if it is ok with you to  have a bone scan.   Initial call taken by: Georga Bora,  July 12, 2010 12:05 PM  Follow-up for Phone Call        She does not meet any of the screening criteria.  Tell her she does not need this test yet. Follow-up by: Michell Heinrich M.D.,  July 13, 2010 8:33 AM  Additional Follow-up for Phone Call Additional follow up Details #1::        Called patient back with the information the doctor had given. Additional Follow-up by: Georga Bora,  July 18, 2010 8:17 AM

## 2010-09-07 NOTE — Miscellaneous (Signed)
  Clinical Lists Changes  Orders: Added new Referral order of Oncology Referral (Oncology) - Signed 

## 2010-09-12 ENCOUNTER — Telehealth: Payer: Self-pay | Admitting: Family Medicine

## 2010-09-13 NOTE — Assessment & Plan Note (Signed)
Summary: 1 month f/u/vfw rsch per pt/dt   Vital Signs:  Patient profile:   45 year old female Menstrual status:  hysterectomy Height:      64.5 inches Weight:      191.4 pounds BMI:     32.46 Temp:     98.8 degrees F temporal Pulse rate:   113 / minute BP sitting:   130 / 84  (right arm) Cuff size:   regular  Vitals Entered By: Francee Piccolo CMA Alexandra Reid) (September 04, 2010 10:33 AM) CC: follow-up visit for labs, fevers//SP Is Patient Diabetic? No   History of Present Illness: 45 y/o WF here for f/u severe fibromyalgia.  Pain control is "the best it has been in years".  However, still 8/10 intensity pain and stiffness upon awakening every morning, and at times her pain is so bad it requires 3-4 of the 5mg  oxycodone tabs at a time to help enough to make it bearable.  Denies constipation but admits to some urinary hesitancy for the last couple of months.  Denies excessive sedation on her chronic pain meds.  Currently not working/unable to work. Additionally, we've been evaluating her recurrent febrile episodes: for the last few months she has suffered from fever to 102-103 every 7-10 days, starts in the morning and is associated with intense full body pain.  The fever usually abates after 24h or less, then she has extreme fatigue for the following 2-3 days and her usual level of body pain.   Rheumatologic and malignancy w/u's have been unrevealing, as have EBV, parvo, and Lyme testing. She had too much nausea on savella, so she recently d/c'd this med. Recent hyponatremia, believed to be due to chronic nausea plus furosemide use--resolved on f/u testing.  Reviewed all recent labs/radiology in detail again today with patient.  Current Medications (verified): 1)  Oxycodone Hcl 5 Mg Tabs (Oxycodone Hcl) .Marland Kitchen.. 1-2 By Mouth Q4-6h As Needed For Breakthrough Pain 2)  Furosemide 20 Mg Tabs (Furosemide) .Marland Kitchen.. 1 By Mouth Once Daily 3)  Nexium 40 Mg  Cpdr (Esomeprazole Magnesium) .Marland Kitchen.. 1 Capsule  Twice A Day 30 Minutes Before Meals 4)  Zofran 4 Mg Tabs (Ondansetron Hcl) .... As Needed 5)  Soma 350 Mg Tabs (Carisoprodol) .Marland Kitchen.. 1 By Mouth Three Times A Day 6)  Amitriptyline Hcl 100 Mg Tabs (Amitriptyline Hcl) .Marland Kitchen.. 1 By Mouth At Bedtime 7)  Ambien 10 Mg Tabs (Zolpidem Tartrate) .Marland Kitchen.. 1 By Mouth At Bedtime 8)  Clonazepam 1 Mg Tabs (Clonazepam) .... 1/2 To 1 Tab By Mouth Two Times A Day 9)  Fish Oil 1000 Mg Caps (Omega-3 Fatty Acids) .... Take 2 Caps By Mouth Once Daily 10)  Calcium 500 Mg Tabs (Calcium) .... Take 1 Tablet By Mouth Once A Day 11)  Vitamin C 500 Mg Tabs (Ascorbic Acid) .... Take 1 Tablet By Mouth Once A Day 12)  Oxycontin 60 Mg Xr12h-Tab (Oxycodone Hcl) .Marland Kitchen.. 1 Tab By Mouth Bid  Allergies (verified): No Known Drug Allergies  Past History:  Past Medical History: Reviewed history from 08/22/2010 and no changes required. Lumbar DDD Chronic Headaches Anxiety Disorder Fibromyalgia (Dr. Consuella Lose [phone]) Interstitial Cystitis obesity Insomnia Mild transaminasemia 03/2010 (ALT 49), normal on f/u testing off of daily tylenol use. Dyspepsia/gastritis/GERD: EGD 06/01/2010: gastritis (NSAIDs) Anorectal bleeding/BRBPR--Colonoscopy 06/01/2010: internal hemorrhoids Recurrent UTIs: renal u/s normal 07/2007 Suspicion of malignancy 08/2010 based on approx 1-2 mo of wt loss, intermittent episodes of fever and worsened musculoskeletal pain, elevated ESR and otherwise negative autoimmune/inflammitory testing:  CT abd/pelv  08/22/10  (tiny right lung nodules x 3, mild biliary dilatation c/w physiologic change s/p cholecystectomy.  Per radiologist, f/u of lung nodules NOT recommended since she is NOT high risk for lung cancer.) CXR 08/22/10 nl except mild chronic bronchitic changes  Past Surgical History: Reviewed history from 04/24/2010 and no changes required. Bunionectomy right foot Hysterectomy cholecystectomy OTIF second and third metatarsals  06/05/05 by Dr. Romeo Apple     Family History: Reviewed history from 08/22/2010 and no changes required. grandfather had colon cancer Father: testicular cancer,  Mother: Picks disease (dementia)  Social History: Reviewed history from 07/10/2010 and no changes required. 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 Currently seeking medical disability as of 09/04/2010.  Review of Systems  The patient denies weight gain, vision loss, decreased hearing, hoarseness, chest pain, syncope, hemoptysis, abdominal pain, melena, hematochezia, hematuria, incontinence, genital sores, muscle weakness, suspicious skin lesions, transient blindness, difficulty walking, abnormal bleeding, enlarged lymph nodes, angioedema, and breast masses.         Patient reports DOE with wheezing in daytime, also some nighttime awakenings 1-2 nights per week with GER into throat and subsequent wheezing/inhaler use.  +Depressed mood.  + lower legs edema at the end of the day many days.  +facial flushing/hot flashes lately.  Physical Exam  General:  Wt 191.4 lbs (down 3.6 lbs in the last 1 mo) Gen: alert, teary/crying intermittently.  Lucid thought and speech.  No distress. HEENT: Scalp without lesions or hair loss.  Mild diffuse warmth/flush to face.  No rash, though.  Ears: EACs clear, normal epithelium.  TMs with good light reflex and landmarks bilaterally.  Eyes: no injection, icteris, swelling, or exudate.  EOMI, PERRLA. Nose: no drainage or turbinate edema/swelling.  No injection or focal lesion.  Mouth: lips without lesion/swelling.  Oral mucosa pink and moist.  Dentition intact and without obvious caries or gingival swelling.  Oropharynx without erythema, exudate, or swelling.  Neck: supple.  No lymphadenopathy, thyromegaly, or mass. Chest: symmetric expansion, with nonlabored respirations.  Clear and equal breath sounds in all lung fields.   She has poor air flow on forced expiration but no  wheezing. CV: RRR, no m/r/g.  Peripheral pulses 2+/symmetric. EXT: no clubbing, cyanosis, or edema.  Skin: no rash. Joints: no erythema, warmth, or swelling. She is tender essentially anywhere I palpate her musculoskeletal soft tissues.     Impression & Recommendations:  Problem # 1:  FIBROMYALGIA, SEVERE (ICD-729.1) Assessment Improved Pain control improved.  Continue current meds, printed oxycontin and oxycodone rx's and handed them to patient today. F/u 83mo., at which time we'll do a random urine drug screen.  Her updated medication list for this problem includes:    Oxycodone Hcl 5 Mg Tabs (Oxycodone hcl) .Marland Kitchen... 1-2 by mouth q4-6h as needed for breakthrough pain    Soma 350 Mg Tabs (Carisoprodol) .Marland Kitchen... 1 by mouth three times a day    Oxycontin 60 Mg Xr12h-tab (Oxycodone hcl) .Marland Kitchen... 1 tab by mouth bid  Problem # 2:  FEVER, RECURRENT (ICD-087.9) Assessment: Deteriorated with weight loss, plus recently elevated ESR and CPK (both of which normalized on f/u testing). Rheum and malig w/u unrevealing.  Currently we are getting her set up with hem/onc evaluation at [facility] for further evaluation.  Problem # 3:  HYPONATREMIA (ICD-276.1) Assessment: Improved Resolved. No further furosemide for now.  Problem # 4:  DEPRESSION (ICD-311) Assessment: Unchanged She once again is resistant to counseling.....she  has put off the [facility] referral we discussed last visit. She couldn't tolerate recent trial of savella, and has failed multiple meds in the past for various reasons. At this time we'll hold off on starting any new meds, but will continue with trying med treatment when some of her more pressing medical issues are resolved. Her updated medication list for this problem includes:    Amitriptyline Hcl 100 Mg Tabs (Amitriptyline hcl) .Marland Kitchen... 1 by mouth at bedtime    Clonazepam 1 Mg Tabs (Clonazepam) .Marland Kitchen... 1/2 to 1 tab by mouth two times a day  Problem # 5:  OSTEOPENIA  (ICD-733.90) Assessment: Unchanged Recent CXR showed osteopenia, plus bone densitometry in 2006 showed same per pt. In near future will repeat bone densitometry. Her updated medication list for this problem includes:    Calcium 500 Mg Tabs (Calcium) .Marland Kitchen... Take 1 tablet by mouth once a day  Problem # 6:  ASTHMA, PERSISTENT (ICD-493.90) Assessment: New  PFTs today wnl.  Recent CXR showed mild chronic bronchitic changes.  Pt symptomatic nearly daily. Start advair 250/50, 1 puff two times a day--samples for 5 wks given to patient today.  Continue as needed albuterol inhaler. Therapeutic expectations and side effect profile of medication discussed today.  Patient's questions answered.  Orders: Spirometry w/Graph (94010)  Complete Medication List: 1)  Oxycodone Hcl 5 Mg Tabs (Oxycodone hcl) .Marland Kitchen.. 1-2 by mouth q4-6h as needed for breakthrough pain 2)  Furosemide 20 Mg Tabs (Furosemide) .Marland Kitchen.. 1 by mouth once daily 3)  Nexium 40 Mg Cpdr (Esomeprazole magnesium) .Marland Kitchen.. 1 capsule twice a day 30 minutes before meals 4)  Zofran Odt 8 Mg Tbdp (Ondansetron) .Marland Kitchen.. 1 tab by mouth q8h as needed for nausea 5)  Soma 350 Mg Tabs (Carisoprodol) .Marland Kitchen.. 1 by mouth three times a day 6)  Amitriptyline Hcl 100 Mg Tabs (Amitriptyline hcl) .Marland Kitchen.. 1 by mouth at bedtime 7)  Ambien 10 Mg Tabs (Zolpidem tartrate) .Marland Kitchen.. 1 by mouth at bedtime 8)  Clonazepam 1 Mg Tabs (Clonazepam) .... 1/2 to 1 tab by mouth two times a day 9)  Fish Oil 1000 Mg Caps (Omega-3 fatty acids) .... Take 2 caps by mouth once daily 10)  Calcium 500 Mg Tabs (Calcium) .... Take 1 tablet by mouth once a day 11)  Vitamin C 500 Mg Tabs (Ascorbic acid) .... Take 1 tablet by mouth once a day 12)  Oxycontin 60 Mg Xr12h-tab (Oxycodone hcl) .Marland Kitchen.. 1 tab by mouth bid  Patient Instructions: 1)  Please schedule a follow-up appointment in 1 month.  Prescriptions: OXYCONTIN 60 MG XR12H-TAB (OXYCODONE HCL) 1 tab by mouth bid  #60 x 0   Entered and Authorized by:    Alexandra Reid M.D.   Signed by:   Alexandra Reid M.D. on 09/04/2010   Method used:   Print then Give to Patient   RxID:   [id number] OXYCODONE HCL 5 MG TABS (OXYCODONE HCL) 1-2 by mouth q4-6h as needed for breakthrough pain  #240 x 0   Entered and Authorized by:   Alexandra Reid M.D.   Signed by:   Alexandra Reid M.D. on 09/04/2010   Method used:   Print then Give to Patient   RxID:   [id number] ZOFRAN ODT 8 MG TBDP (ONDANSETRON) 1 tab by mouth q8h as needed for nausea  #24 x 5   Entered and Authorized by:   Alexandra Reid M.D.   Signed by:   Alexandra Reid M.D. on 09/04/2010  Method used:   Electronically to        [facility]* (retail)       [address]       [address] Shipping/mailing       [place], [place]  [address]       Ph: [phone]       Fax: [phone]   RxID:   [id number]    Orders Added: 1)  Est. Patient Level IV [84132] 2)  Spirometry w/Graph [44010]

## 2010-09-13 NOTE — Miscellaneous (Signed)
  Clinical Lists Changes  Problems: Removed problem of OSTEOPENIA (ICD-733.90) Removed problem of HYPONATREMIA (ICD-276.1) Removed problem of OTHER SCREENING MAMMOGRAM (ICD-V76.12) Removed problem of ESR, ELEVATED (ICD-790.1) Removed problem of EDEMA (ICD-782.3) Removed problem of HEARTBURN (ICD-787.1) Removed problem of FECAL OCCULT BLOOD (ICD-792.1) Observations: Added new observation of PAST SURG HX: Bunionectomy right foot TAH and BSO (no malignancy) cholecystectomy OTIF second and third metatarsals  06/05/05 by Dr. Romeo Apple   (09/04/2010 14:49) Added new observation of PAST MED HX: Lumbar DDD Chronic Headaches Anxiety Disorder Fibromyalgia (Dr. Consuella Lose [phone]) Interstitial Cystitis obesity Insomnia Mild transaminasemia 03/2010 (ALT 49), normal on f/u testing off of daily tylenol use. Dyspepsia/gastritis/GERD: EGD 06/01/2010: gastritis (NSAIDs) Anorectal bleeding/BRBPR--Colonoscopy 06/01/2010: internal hemorrhoids Recurrent UTIs: renal u/s normal 07/2007 Suspicion of malignancy 08/2010 based on approx 1-2 mo of wt loss, intermittent episodes of fever and worsened musculoskeletal pain, elevated ESR and otherwise negative autoimmune/inflammitory testing:  CT abd/pelv 08/22/10  (tiny right lung nodules x 3, mild biliary dilatation c/w physiologic change s/p cholecystectomy.  Per radiologist, f/u of lung nodules NOT recommended since she is NOT high risk for lung cancer.) CXR 08/22/10 nl except mild chronic bronchitic changes and osteopenia. Osteopenia (bone densitometry 2006 per pt)   (09/04/2010 14:49) Added new observation of PRIMARY MD: Aneta Mins Kaikoa Magro,MD (09/04/2010 14:49)       Allergies: No Known Drug Allergies   Past History:  Past Medical History: Lumbar DDD Chronic Headaches Anxiety Disorder Fibromyalgia (Dr. Consuella Lose [phone]) Interstitial Cystitis obesity Insomnia Mild transaminasemia 03/2010 (ALT 49), normal on f/u testing off of daily  tylenol use. Dyspepsia/gastritis/GERD: EGD 06/01/2010: gastritis (NSAIDs) Anorectal bleeding/BRBPR--Colonoscopy 06/01/2010: internal hemorrhoids Recurrent UTIs: renal u/s normal 07/2007 Suspicion of malignancy 08/2010 based on approx 1-2 mo of wt loss, intermittent episodes of fever and worsened musculoskeletal pain, elevated ESR and otherwise negative autoimmune/inflammitory testing:  CT abd/pelv 08/22/10  (tiny right lung nodules x 3, mild biliary dilatation c/w physiologic change s/p cholecystectomy.  Per radiologist, f/u of lung nodules NOT recommended since she is NOT high risk for lung cancer.) CXR 08/22/10 nl except mild chronic bronchitic changes and osteopenia. Osteopenia (bone densitometry 2006 per pt)  Past Surgical History: Bunionectomy right foot TAH and BSO (no malignancy) cholecystectomy OTIF second and third metatarsals  06/05/05 by Dr. Romeo Apple

## 2010-09-13 NOTE — Miscellaneous (Signed)
  Clinical Lists Changes  Medications: Changed medication from FUROSEMIDE 20 MG TABS (FUROSEMIDE) 1 by mouth once daily to FUROSEMIDE 20 MG TABS (FUROSEMIDE) 1 qd PRN: HOLDING Added new medication of ADVAIR DISKUS 250-50 MCG/DOSE AEPB (FLUTICASONE-SALMETEROL) 1 puff bid     Prior Medications: OXYCODONE HCL 5 MG TABS (OXYCODONE HCL) 1-2 by mouth q4-6h as needed for breakthrough pain NEXIUM 40 MG  CPDR (ESOMEPRAZOLE MAGNESIUM) 1 capsule twice a day 30 minutes before meals SOMA 350 MG TABS (CARISOPRODOL) 1 by mouth three times a day AMITRIPTYLINE HCL 100 MG TABS (AMITRIPTYLINE HCL) 1 by mouth at bedtime AMBIEN 10 MG TABS (ZOLPIDEM TARTRATE) 1 by mouth at bedtime CLONAZEPAM 1 MG TABS (CLONAZEPAM) 1/2 to 1 tab by mouth two times a day FISH OIL 1000 MG CAPS (OMEGA-3 FATTY ACIDS) take 2 caps by mouth once daily CALCIUM 500 MG TABS (CALCIUM) Take 1 tablet by mouth once a day VITAMIN C 500 MG TABS (ASCORBIC ACID) Take 1 tablet by mouth once a day OXYCONTIN 60 MG XR12H-TAB (OXYCODONE HCL) 1 tab by mouth bid Current Allergies: No known allergies

## 2010-09-14 ENCOUNTER — Encounter: Payer: Self-pay | Admitting: Family Medicine

## 2010-09-14 ENCOUNTER — Telehealth: Payer: Self-pay | Admitting: Family Medicine

## 2010-09-18 ENCOUNTER — Encounter: Payer: Self-pay | Admitting: Family Medicine

## 2010-09-21 NOTE — Progress Notes (Signed)
  Phone Note Other Incoming   Summary of Call: Pt. called with report of recent outbreak of painful genital ulcers, began about 5-6 d/a but some still present and hurting.  She delayed calling to request med b/c of embarrassment.  She reports having an initial outbreak at age 45 yrs, then another in early 42s when pregnant.  I will call in valtrex 500mg , 1 tab two times a day x 3d, RF x 2. Initial call taken by: Michell Heinrich M.D.,  September 12, 2010 3:32 PM    New/Updated Medications: VALTREX 500 MG TABS (VALACYCLOVIR HCL) 1 tab by mouth two times a day x 3 days Prescriptions: VALTREX 500 MG TABS (VALACYCLOVIR HCL) 1 tab by mouth two times a day x 3 days  #6 x 2   Entered and Authorized by:   Michell Heinrich M.D.   Signed by:   Michell Heinrich M.D. on 09/12/2010   Method used:   Electronically to        [facility]* (retail)       [address]       [address] [place] [address]mailing       [place], [place]  [address]       Ph: [phone]       Fax: [phone]   RxID:   [id number]

## 2010-09-21 NOTE — Miscellaneous (Signed)
  Clinical Lists Changes  Observations: Added new observation of GASTROENT MD: Dr. Christella Hartigan ([facility]) (09/14/2010 14:32) Added new observation of ORTHOPEDMD: Dr. Doneen Poisson (09/14/2010 14:32) Added new observation of RHEUMATOLMD:  Dr. Consuella Lose [facility]) (09/14/2010 14:32) Added new observation of PRIMARY MD: Gaynelle Arabian (09/14/2010 14:32) Added new observation of MAMMOGRAM: normal (08/22/2010 14:34) Added new observation of COLONOSCOPY: normal (06/01/2010 14:34) Added new observation of BONE DENSITY: osteopenia (08/06/2004 14:34)         Preventive Care Screening  Mammogram:    Date:  08/22/2010    Results:  normal  Colonoscopy:    Date:  06/01/2010    Results:  normal  Bone Density:    Date:  08/06/2004    Results:  osteopenia  Care Coordination Gastroenterologist: Dr. Christella Hartigan ( GI) Orthopedist: Dr. Doneen Poisson Rheumatologist:  Dr. Consuella Lose [facility])

## 2010-09-21 NOTE — Progress Notes (Signed)
Summary: Lab results  Phone Note Other Incoming   Summary of Call: Please notify: her blood cultures were NEGATIVE. Initial call taken by: Michell Heinrich M.D.,  September 07, 2010 11:45 AM  Follow-up for Phone Call        pt informed.  Pt states Dr Corliss Skains suggested pt see an infection doctor?  Follow-up by: Josph Macho RMA,  September 08, 2010 4:44 PM  Additional Follow-up for Phone Call Additional follow up Details #1::        I agree with referral to ID and I'm working with Diane to get this done.  In the meantime, I spoke with Corrie Dandy today and I want her to go to [facility] in [place] for some more tests before she goes to the ID Doc. Also, I asked her to drop by here whenever she can so she can give a urine sample for UA and urine culture (Dx. fever).  I'll print out lab orders. Additional Follow-up by: Michell Heinrich M.D.,  September 12, 2010 10:19 AM    Additional Follow-up for Phone Call Additional follow up Details #2::    orders faxed.  [phone] Follow-up by: Francee Piccolo CMA (AAMA),  September 12, 2010 11:19 AM

## 2010-09-21 NOTE — Progress Notes (Signed)
  Phone Note Other Incoming      

## 2010-09-21 NOTE — Letter (Signed)
Summary: *Referral Letter  [place] [facility]  [address]   [place], [place] [address]   Phone: [phone]  Fax: [phone]    09/14/2010  Thank you in advance for agreeing to see my patient:   [address] [place], [place]  [address]  Phone: [phone]  Reason for Referral: Recurrent fever      Alexandra Reid has a long history of fibromyalgia and has been followed by Dr. Consuella Lose at [facility] for this.  I have been managing her ever-increasing pain every month or two for the last year or so, and starting in December 2011 she began to complain of recurring episodes of fever.  Every 7-10 days she gets fever to 102-104 range for about 24 hours, accompanied by intense increase in her whole body pain and stiffness.  This is followed by a 2 day period of extreme fatigue.  She has lost approximately 20 pounds during the 2 months she has had these recurring fevers.  The episodes are largely absent of any other consistent symptom.   On one of these occasions she had some oral ulcers and on another she had some urinary spasms for 1 day.  Initial concern was for an inflammitory disorder or malignancy.  However, repeated exams by Dr. Consuella Lose and myself have been unremarkable except for her usual soft tissue tenderness, and repeated panels of routine labs have been unrevealing (CBCs, peripheral blood smear, metabolic panels, Lipase, ESR, Rh factor, ANA, Lyme titers, Parvo titers, and EBV titers.  Blood culture done when afebrile was negative.  I have arranged for HIV and RPR testing to be done but Seniya said she has to put off this testing for the present time because of a temporary health insurance problem).        Radiologic evaluation has been done in the last month: a chest x-ray showed mild chronic bronchitic changes, a contrast enhanced CT of abd/pelvis was unremarkable, and a mammogram was normal.  An L/S spine MRI recently done by her orthopedist was remarkable only for mild DDD.  A colonoscopy done for  BRBPR in the fall of 2011 showed internal hemorrhoids, and an EGD at that time for persistent GERD showed gastritis.      Due to her rheumatologic and malignancy evaluations being negative thus far, I would appreciate you seeing her and determining whether there is any infectious cause for her recurrent fevers.  Current Medical Problems: 1)  ASTHMA, PERSISTENT (ICD-493.90) 2)  FATIGUE (ICD-780.79) 3)  FEVER, RECURRENT (ICD-087.9) 4)  WEIGHT LOSS (ICD-783.21) 5)  FIBROMYALGIA, SEVERE (ICD-729.1)  Current Medications: 1)  OXYCODONE HCL 5 MG TABS (OXYCODONE HCL) 1-2 by mouth q4-6h as needed for breakthrough pain 2)  FUROSEMIDE 20 MG TABS (FUROSEMIDE) 1 qd PRN: HOLDING 3)  NEXIUM 40 MG  CPDR (ESOMEPRAZOLE MAGNESIUM) 1 capsule twice a day 30 minutes before meals 4)  ZOFRAN ODT 8 MG TBDP (ONDANSETRON) 1 tab by mouth q8h as needed for nausea 5)  SOMA 350 MG TABS (CARISOPRODOL) 1 by mouth three times a day 6)  AMITRIPTYLINE HCL 100 MG TABS (AMITRIPTYLINE HCL) 1 by mouth at bedtime 7)  AMBIEN 10 MG TABS (ZOLPIDEM TARTRATE) 1 by mouth at bedtime 8)  CLONAZEPAM 1 MG TABS (CLONAZEPAM) 1/2 to 1 tab by mouth two times a day 9)  FISH OIL 1000 MG CAPS (OMEGA-3 FATTY ACIDS) take 2 caps by mouth once daily 10)  CALCIUM 500 MG TABS (CALCIUM) Take 1 tablet by mouth once a day 11)  VITAMIN  C 500 MG TABS (ASCORBIC ACID) Take 1 tablet by mouth once a day 12)  OXYCONTIN 60 MG XR12H-TAB (OXYCODONE HCL) 1 tab by mouth bid 13)  ADVAIR DISKUS 250-50 MCG/DOSE AEPB (FLUTICASONE-SALMETEROL) 1 puff bid 14)  VALTREX 500 MG TABS (VALACYCLOVIR HCL) 1 tab by mouth two times a day x 3 days 15)  PROMETHAZINE HCL 25 MG TABS (PROMETHAZINE HCL) 1 tab by mouth q6h as needed for nausea   Past Medical History: 1)  Lumbar DDD 2)  Chronic Headaches 3)  Anxiety Disorder and depressive disorder 4)  Fibromyalgia (Dr. Consuella Lose [phone]) 5)  Interstitial Cystitis 6)  obesity 7)  Insomnia 8)  Mild transaminasemia 03/2010 (ALT  49), normal on f/u testing off of daily tylenol use. 9)  Dyspepsia/gastritis/GERD: EGD 06/01/2010: gastritis (NSAIDs) 10)  Anorectal bleeding/BRBPR--Colonoscopy 06/01/2010: internal hemorrhoids 11)  Recurrent UTIs: renal u/s normal 07/2007. 12)  Osteopenia (bone densitometry 2006 per pt) 13) Restless legs syndrome   Prior History of Blood Transfusions: none  Thank you again for agreeing to see our patient; please contact us if you have any further questions or need additional information.  Sincerely,   Elizebeth Brooking May Manrique M.D.

## 2010-09-21 NOTE — Progress Notes (Signed)
Summary: Medication and bloodwork  Phone Note Call from Patient   Caller: Patient Summary of Call: Patient wants Phenergan called in for her nausea. [facility] [place] . Blood work will have to wait until her [employer] starts. Initial call taken by: Georga Bora,  September 12, 2010 4:55 PM  Follow-up for Phone Call        eRx for phenergan sent. Follow-up by: Michell Heinrich M.D.,  September 12, 2010 5:08 PM    New/Updated Medications: PROMETHAZINE HCL 25 MG TABS (PROMETHAZINE HCL) 1 tab by mouth q6h as needed for nausea Prescriptions: PROMETHAZINE HCL 25 MG TABS (PROMETHAZINE HCL) 1 tab by mouth q6h as needed for nausea  #60 x 3   Entered and Authorized by:   Michell Heinrich M.D.   Signed by:   Michell Heinrich M.D. on 09/12/2010   Method used:   [facility] to        [facility]  [address] [id number] (retail)       [address]       [place], [place]  [address]       Ph: [phone] or [phone]       Fax: [phone]   RxID:   [id number]

## 2010-09-26 ENCOUNTER — Telehealth: Payer: Self-pay | Admitting: Family Medicine

## 2010-09-26 ENCOUNTER — Encounter: Payer: Self-pay | Admitting: Family Medicine

## 2010-09-26 DIAGNOSIS — R350 Frequency of micturition: Secondary | ICD-10-CM | POA: Insufficient documentation

## 2010-09-26 LAB — CONVERTED CEMR LAB
Bilirubin Urine: NEGATIVE
HIV: NONREACTIVE
Lymphocytes Relative: 25 % (ref 12–46)
Lymphs Abs: 1.5 10*3/uL (ref 0.7–4.0)
Neutrophils Relative %: 65 % (ref 43–77)
Platelets: 273 10*3/uL (ref 150–400)
Protein, ur: NEGATIVE mg/dL
Sed Rate: 10 mm/hr (ref 0–22)
Squamous Epithelial / LPF: NONE SEEN /lpf
Urobilinogen, UA: 0.2 (ref 0.0–1.0)
WBC: 5.9 10*3/uL (ref 4.0–10.5)

## 2010-09-27 ENCOUNTER — Telehealth: Payer: Self-pay | Admitting: Family Medicine

## 2010-09-27 ENCOUNTER — Encounter: Payer: Self-pay | Admitting: Family Medicine

## 2010-09-27 NOTE — Miscellaneous (Signed)
  Clinical Lists Changes  Observations: Added new observation of UROLOGY MD: Dr. Earlene Plater ([facility]) (09/18/2010 16:13) Added new observation of SURGNAME: Dr. Malvin Johns [facility]) (09/18/2010 16:13) Added new observation of RHEUMATOLMD: Dr. Consuella Lose [facility]) (09/18/2010 16:13) Added new observation of ORTHOPEDMD: Dr. Magnus Ivan [facility]opedics) (09/18/2010 16:13) Added new observation of GYNECO MD: [facility] Ace) (09/18/2010 16:13) Added new observation of PAST SURG HX: Bunionectomy right foot TAH and BSO for endometriosis 2003 Cholecystectomy 2005 (cholelithiasis w/choledocholithiasis on u/s 2005.  PreOperative ERCP showed NO stone or other obstruction in the biliary tract, but CBD measured 9mm near head of pancreas.  CT abd done for fever/wt loss w/u 08/2010 showed NO CHANGE in CBD measurement). OTIF second and third metatarsals 06/05/05 by Dr. Romeo Apple   (09/18/2010 16:13) Added new observation of PRIMARY MD: Gaynelle Arabian (09/18/2010 16:13)       Past History:  Past Surgical History: Bunionectomy right foot TAH and BSO for endometriosis 2003 Cholecystectomy 2005 (cholelithiasis w/choledocholithiasis on u/s 2005.  PreOperative ERCP showed NO stone or other obstruction in the biliary tract, but CBD measured 9mm near head of pancreas.  CT abd done for fever/wt loss w/u 08/2010 showed NO CHANGE in CBD measurement). OTIF second and third metatarsals 06/05/05 by Dr. Romeo Apple    Care Coordination Gynecologist: [facility]) Orthopedist: Dr. Magnus Ivan [facility] orthoped[facility]) Rheumatologist: Dr. Consuella Lose [facility] orthopedics) Surgeon: Dr. Malvin Johns [facility]) Urologist: Dr. Earlene Plater [facility]rology)

## 2010-09-28 ENCOUNTER — Ambulatory Visit: Payer: Self-pay | Admitting: Family Medicine

## 2010-09-29 ENCOUNTER — Ambulatory Visit (INDEPENDENT_AMBULATORY_CARE_PROVIDER_SITE_OTHER): Payer: Commercial Managed Care - PPO | Admitting: Family Medicine

## 2010-09-29 ENCOUNTER — Encounter: Payer: Self-pay | Admitting: Family Medicine

## 2010-09-29 DIAGNOSIS — N3000 Acute cystitis without hematuria: Secondary | ICD-10-CM

## 2010-09-29 DIAGNOSIS — A689 Relapsing fever, unspecified: Secondary | ICD-10-CM

## 2010-09-29 DIAGNOSIS — R634 Abnormal weight loss: Secondary | ICD-10-CM

## 2010-09-29 DIAGNOSIS — J45909 Unspecified asthma, uncomplicated: Secondary | ICD-10-CM

## 2010-09-29 DIAGNOSIS — IMO0001 Reserved for inherently not codable concepts without codable children: Secondary | ICD-10-CM

## 2010-10-02 ENCOUNTER — Encounter: Payer: Self-pay | Admitting: Family Medicine

## 2010-10-03 ENCOUNTER — Encounter: Payer: Self-pay | Admitting: Oncology

## 2010-10-03 ENCOUNTER — Encounter: Payer: Self-pay | Admitting: Family Medicine

## 2010-10-03 DIAGNOSIS — R1013 Epigastric pain: Secondary | ICD-10-CM | POA: Insufficient documentation

## 2010-10-03 DIAGNOSIS — R110 Nausea: Secondary | ICD-10-CM | POA: Insufficient documentation

## 2010-10-03 NOTE — Progress Notes (Signed)
Summary: order for UA  Phone Note Call from Patient Call back at Home Phone [phone]   Caller: Patient Summary of Call: pt is at lab and wants to know if we can add on a UA.  Pt is haivng frequency and burning with urination.  also c/o lower abdominal pain.  Pt states it could be her interstitial cystitis, but while she is at the lab wanted to know if we could check. Initial call taken by: Francee Piccolo CMA [facility] Dull),  September 26, 2010 4:12 PM  Follow-up for Phone Call        Verbal OK per Dr. Judie Petit for UA, micro and culture.  DX -urinary frequency.  Order faxed to [facility]. Follow-up by: Francee Piccolo CMA Duncan Dull),  September 26, 2010 4:15 PM  Additional Follow-up for Phone Call Additional follow up Details #1::        Agree. Additional Follow-up by: Michell Heinrich M.D.,  September 27, 2010 9:13 AM  New Problems: URINARY FREQUENCY (ICD-788.41)   New Problems: URINARY FREQUENCY (ICD-788.41)

## 2010-10-03 NOTE — Progress Notes (Signed)
Summary: Lab results  Phone Note Other Incoming   Summary of Call: Please notify pt: all blood tests done yesterday were normal.  Her urine showed some WBC's but was otherwise normal.  Please call in rx (or get her to confirm which pharmacy she wants to use and I'll eRx it) for Bactrim DS, 1 tab by mouth two times a day x 3d, #6, no RF. Urine culture is pending.  Thx Initial call taken by: Michell Heinrich M.D.,  September 27, 2010 9:19 AM  Follow-up for Phone Call        Notified pt of lab results and of need for abx.  Advised culture is still pending. She will pick up tomorrow at [facility]. Follow-up by: Francee Piccolo CMA Duncan Dull),  September 28, 2010 8:32 AM    New/Updated Medications: BACTRIM DS 800-160 MG TABS (SULFAMETHOXAZOLE-TRIMETHOPRIM) 1 tab by mouth two times a day x 3d, Prescriptions: BACTRIM DS 800-160 MG TABS (SULFAMETHOXAZOLE-TRIMETHOPRIM) 1 tab by mouth two times a day x 3d,  #6 x 0   Entered by:   Francee Piccolo CMA (AAMA)   Authorized by:   Michell Heinrich M.D.   Signed by:   Francee Piccolo CMA (AAMA) on 09/28/2010   Method used:   Electronically to        [facility]* (retail)       [address]       [address] Shipping/mailing       [place], [place]  [address]       Ph: [phone]       Fax: [phone]   RxID:   [id number]

## 2010-10-03 NOTE — Assessment & Plan Note (Signed)
Summary: 1 month follow up   Vital Signs:  Patient profile:   45 year old female Menstrual status:  hysterectomy Height:      64.5 inches Weight:      184.25 pounds BMI:     31.25 Temp:     97.7 degrees F oral Pulse rate:   111 / minute BP sitting:   109 / 75  (right arm) Cuff size:   regular  Vitals Entered By: Francee Piccolo CMA Duncan Dull) (September 29, 2010 2:05 PM) CC: 1 month follow up...SP Is Patient Diabetic? No   History of Present Illness: 45 y/o WF here for routine fibromyalgia/chronic pain follow up. Chronic pain: no significant changes.  Still describes full body pain upon awakening, 8/10 intensity, lasts all day and is partially improved by oxycontin 60mg  two times a day and 2 of the 5mg  oxycodone q6h. She does light housework and must take frequent breaks.  Heat/ice/bengay application helps minimally. Unable to sustain focus/concentration long enough to work.  Unable to stand or move for periods long enough to work.  No new symptoms such as joint swelling or rash.  Still with episodic fevers, most recently 2 days ago.  The episode prior to that was 16d, but all others have been every 7-10d consistently.  Temp to 103, exteme full body pain, extreme nausea without vomiting, headaches--all lasting 1 day, followed by a couple of days of extreme fatigue.  This most recent episode, she had urinary urgency/frequency, and dysuria the day following the fever and we checked her urine and it did grow E. coli-pansensitive.  Bactrim has been Erx'd but she hasn't picked it up yet.  She has an Infectious Disease consult visit set for 10/19/09.  Describes worse gastritis lately: burning epigastric pain, constant.  Has not been on nexium lately due to insurance/money problem, but will restart this soon.  Denies melena.  Still having problems with a prolapsing hemorrhoid (her description).  No BRBPR.   Still describes herself as being under immense stress but doesn't get specific.  She asks  about another trial of cymbalta.  Of note, since starting daily advair she is improved/requires much less albuterol.    Current Medications (verified): 1)  Oxycodone Hcl 5 Mg Tabs (Oxycodone Hcl) .Marland Kitchen.. 1-2 By Mouth Q4-6h As Needed For Breakthrough Pain 2)  Furosemide 20 Mg Tabs (Furosemide) .Marland Kitchen.. 1 Qd Prn: Holding 3)  Nexium 40 Mg  Cpdr (Esomeprazole Magnesium) .Marland Kitchen.. 1 Capsule Twice A Day 30 Minutes Before Meals 4)  Zofran Odt 8 Mg Tbdp (Ondansetron) .Marland Kitchen.. 1 Tab By Mouth Q8h As Needed For Nausea 5)  Soma 350 Mg Tabs (Carisoprodol) .Marland Kitchen.. 1 By Mouth Three Times A Day 6)  Amitriptyline Hcl 100 Mg Tabs (Amitriptyline Hcl) .Marland Kitchen.. 1 By Mouth At Bedtime 7)  Ambien 10 Mg Tabs (Zolpidem Tartrate) .Marland Kitchen.. 1 By Mouth At Bedtime 8)  Clonazepam 1 Mg Tabs (Clonazepam) .... 1/2 To 1 Tab By Mouth Two Times A Day 9)  Fish Oil 1000 Mg Caps (Omega-3 Fatty Acids) .... Take 2 Caps By Mouth Once Daily 10)  Calcium 500 Mg Tabs (Calcium) .... Take 1 Tablet By Mouth Once A Day 11)  Vitamin C 500 Mg Tabs (Ascorbic Acid) .... Take 1 Tablet By Mouth Once A Day 12)  Oxycontin 60 Mg Xr12h-Tab (Oxycodone Hcl) .Marland Kitchen.. 1 Tab By Mouth Bid 13)  Advair Diskus 250-50 Mcg/dose Aepb (Fluticasone-Salmeterol) .Marland Kitchen.. 1 Puff Bid 14)  Promethazine Hcl 25 Mg Tabs (Promethazine Hcl) .Marland Kitchen.. 1 Tab By Mouth Q6h  As Needed For Nausea 15)  Bactrim Ds 800-160 Mg Tabs (Sulfamethoxazole-Trimethoprim) .Marland Kitchen.. 1 Tab By Mouth Two Times A Day X 3d,  Allergies (verified): No Known Drug Allergies  Past History:  Past Medical History: Reviewed history from 09/04/2010 and no changes required. Lumbar DDD Chronic Headaches Anxiety Disorder Fibromyalgia (Dr. Consuella Lose [phone]) Interstitial Cystitis obesity Insomnia Mild transaminasemia 03/2010 (ALT 49), normal on f/u testing off of daily tylenol use. Dyspepsia/gastritis/GERD: EGD 06/01/2010: gastritis (NSAIDs) Anorectal bleeding/BRBPR--Colonoscopy 06/01/2010: internal hemorrhoids Recurrent UTIs: renal u/s  normal 07/2007 Suspicion of malignancy 08/2010 based on approx 1-2 mo of wt loss, intermittent episodes of fever and worsened musculoskeletal pain, elevated ESR and otherwise negative autoimmune/inflammitory testing:  CT abd/pelv 08/22/10  (tiny right lung nodules x 3, mild biliary dilatation c/w physiologic change s/p cholecystectomy.  Per radiologist, f/u of lung nodules NOT recommended since she is NOT high risk for lung cancer.) CXR 08/22/10 nl except mild chronic bronchitic changes and osteopenia. Osteopenia (bone densitometry 2006 per pt)  Past Surgical History: Reviewed history from 09/18/2010 and no changes required. Bunionectomy right foot TAH and BSO for endometriosis 2003 Cholecystectomy 2005 (cholelithiasis w/choledocholithiasis on u/s 2005.  PreOperative ERCP showed NO stone or other obstruction in the biliary tract, but CBD measured 9mm near head of pancreas.  CT abd done for fever/wt loss w/u 08/2010 showed NO CHANGE in CBD measurement). OTIF second and third metatarsals 06/05/05 by Dr. Romeo Apple    Family History: Reviewed history from 08/22/2010 and no changes required. grandfather had colon cancer Father: testicular cancer,  Mother: Picks disease (dementia)  Social History: Reviewed history from 09/04/2010 and no changes required. 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 Currently seeking medical disability as of 09/04/2010.  Review of Systems       The patient complains of anorexia, fever, weight loss, headaches, abdominal pain, and depression.    Physical Exam  General:  VS: noted, all normal. Gen: Alert, well appearing, oriented x 4.  Pleasant for 1/2 of the visit, tearful 1/2 of the visit. HEENT: Scalp without lesions or hair loss.  Eyes: no injection, icteris, swelling, or exudate.  EOMI, PERRLA. Nose: no drainage or turbinate edema/swelling.  No injection or focal lesion.  Mouth: lips without lesion/swelling.   Oral mucosa pink and moist.  Dentition intact and without obvious caries or gingival swelling.  Oropharynx without erythema, exudate, or swelling.  Neck: supple.  No lymphadenopathy or mass.  Thyroid gland palpable, smooth, nontender.  No enlargement. Chest: symmetric expansion, with nonlabored respirations.  Clear and equal breath sounds in all lung fields.   CV: RRR, no m/r/g.  Peripheral pulses 2+/symmetric. ABD: soft, diffuse mild epigastric TTP, without guarding or rebound.  BS normal.  NO HSM or mass. EXT: no c/c/e. MUSCULOSKELETAL: diffuse soft tissue TTP from neck down to lower legs.     Impression & Recommendations:  Problem # 1:  FIBROMYALGIA, SEVERE (ICD-729.1) Assessment Unchanged Will increase breakthrough oxycodone regimen to 2 of the 5mg  tabs q4h as needed.  New rx given today. Will retry cymbalta: 30mg  at bedtime x 3wks, then 60mg  at bedtime--samples given.  Her updated medication list for this problem includes:    Oxycodone Hcl 5 Mg Tabs (Oxycodone hcl) .Marland Kitchen... 1-2 by mouth q4h as needed for breakthrough pain    Soma 350 Mg Tabs (Carisoprodol) .Marland Kitchen... 1 by mouth three times a day    Oxycontin 60 Mg Xr12h-tab (Oxycodone hcl) .Marland Kitchen... 1 tab by mouth bid  Problem # 2:  FEVER, RECURRENT (ICD-087.9) Assessment: Unchanged W/u unrevealing of rheum or malignant cause, so she'll be seeing ID 10/19/09.  She will see if they'll put her on a cancellation list.  Problem # 3:  ACUTE CYSTITIS (ICD-595.0) Assessment: New She'll start her abx today.  Her updated medication list for this problem includes:    Bactrim Ds 800-160 Mg Tabs (Sulfamethoxazole-trimethoprim) .Marland Kitchen... 1 tab by mouth two times a day x 3d,  Problem # 4:  ASTHMA, PERSISTENT (ICD-493.90) Assessment: Improved Continue current controller med.  ProAir rx given today. Her updated medication list for this problem includes:    Advair Diskus 250-50 Mcg/dose Aepb (Fluticasone-salmeterol) .Marland Kitchen... 1 puff bid    Proair Hfa 108 (90  Base) Mcg/act Aers (Albuterol sulfate) .Marland Kitchen... 1-2 puffs q4h prn  Problem # 5:  WEIGHT LOSS (ICD-783.21) Assessment: Deteriorated She has lost another 6 lbs in the last 3+ wks. W/u unrevealing. Of note, most recent TSH was 1.6 (normal) 08/04/10, but given her mild resting tachycardia today, easily palpable thyroid gland, and ongoing wt loss it would be prudent to recheck this at time of next blood draw.    (Also of note: with her ongoing severe nausea and her worsening dyspepsia/gastritis, would also recheck lipase and hepatic panel in near future.  Also, if I.D. w/u unrevealing, then we may need to go back and further pursue her mildly dilated CBD seen on CT scan a few months ago (her LFTs and lipase were normal, so I have attributed this mild CBD dilatation to normal post-cholecystectomy physiologic change).  Complete Medication List: 1)  Oxycodone Hcl 5 Mg Tabs (Oxycodone hcl) .Marland Kitchen.. 1-2 by mouth q4h as needed for breakthrough pain 2)  Furosemide 20 Mg Tabs (Furosemide) .Marland Kitchen.. 1 qd prn: holding 3)  Nexium 40 Mg Cpdr (Esomeprazole magnesium) .Marland Kitchen.. 1 capsule twice a day 30 minutes before meals 4)  Zofran Odt 8 Mg Tbdp (Ondansetron) .Marland Kitchen.. 1 tab by mouth q8h as needed for nausea 5)  Soma 350 Mg Tabs (Carisoprodol) .Marland Kitchen.. 1 by mouth three times a day 6)  Amitriptyline Hcl 100 Mg Tabs (Amitriptyline hcl) .Marland Kitchen.. 1 by mouth at bedtime 7)  Ambien 10 Mg Tabs (Zolpidem tartrate) .Marland Kitchen.. 1 by mouth at bedtime 8)  Clonazepam 1 Mg Tabs (Clonazepam) .... 1/2 to 1 tab by mouth two times a day 9)  Fish Oil 1000 Mg Caps (Omega-3 fatty acids) .... Take 2 caps by mouth once daily 10)  Calcium 500 Mg Tabs (Calcium) .... Take 1 tablet by mouth once a day 11)  Vitamin C 500 Mg Tabs (Ascorbic acid) .... Take 1 tablet by mouth once a day 12)  Oxycontin 60 Mg Xr12h-tab (Oxycodone hcl) .Marland Kitchen.. 1 tab by mouth bid 13)  Advair Diskus 250-50 Mcg/dose Aepb (Fluticasone-salmeterol) .Marland Kitchen.. 1 puff bid 14)  Promethazine Hcl 25 Mg Tabs  (Promethazine hcl) .Marland Kitchen.. 1-2 tabs by mouth q6h as needed for nausea 15)  Bactrim Ds 800-160 Mg Tabs (Sulfamethoxazole-trimethoprim) .Marland Kitchen.. 1 tab by mouth two times a day x 3d, 16)  Proair Hfa 108 (90 Base) Mcg/act Aers (Albuterol sulfate) .Marland Kitchen.. 1-2 puffs q4h prn  Patient Instructions: 1)  F/u in 1 mo Prescriptions: PROAIR HFA 108 (90 BASE) MCG/ACT AERS (ALBUTEROL SULFATE) 1-2 puffs q4h prn  #1 x 2   Entered and Authorized by:   Michell Heinrich M.D.   Signed by:   Michell Heinrich M.D. on 09/29/2010   Method used:   Electronically to  [facility]* (retail)       [address]       [address] Shipping/mailing       [place], [place]  [address]       Ph: [phone]       Fax: [phone]   RxID:   [id number] OXYCONTIN 60 MG XR12H-TAB (OXYCODONE HCL) 1 tab by mouth bid  #60 x 0   Entered and Authorized by:   Michell Heinrich M.D.   Signed by:   Michell Heinrich M.D. on 09/29/2010   Method used:   Print then Give to Patient   RxID:   [id number] OXYCODONE HCL 5 MG TABS (OXYCODONE HCL) 1-2 by mouth q4h as needed for breakthrough pain  #300 x 0   Entered and Authorized by:   Michell Heinrich M.D.   Signed by:   Michell Heinrich M.D. on 09/29/2010   Method used:   Print then Give to Patient   RxID:   [id number] PROMETHAZINE HCL 25 MG TABS (PROMETHAZINE HCL) 1-2 tabs by mouth q6h as needed for nausea  #90 x 1   Entered and Authorized by:   Michell Heinrich M.D.   Signed by:   Michell Heinrich M.D. on 09/29/2010   Method used:   Electronically to        [facility]* (retail)       [address]       [address] [place] [address]/mailing       [place], [place]  [address]       Ph: [phone]       Fax: [phone]   RxID:   [id number] AMITRIPTYLINE HCL 100 MG TABS (AMITRIPTYLINE HCL) 1 by mouth at bedtime  #90 x 1   Entered and Authorized by:   Michell Heinrich M.D.   Signed by:   Michell Heinrich M.D. on 09/29/2010   Method used:   Electronically to        [facility]* (retail)       [address]       [address] [place] [address]/mailing       [place], [place]  [address]       Ph: [phone]       Fax: [phone]   RxID:   [id number] NEXIUM 40 MG  CPDR (ESOMEPRAZOLE MAGNESIUM) 1 capsule twice a day 30 minutes before meals  #180 x 1   Entered and Authorized by:   Michell Heinrich M.D.   Signed by:   Michell Heinrich M.D. on 09/29/2010   Method used:   Electronically to        [facility]* (retail)       [address]       [address] [place] [address]mailing       [place], [place]  [address]       Ph: [phone]       Fax: [phone]   RxID:   [id number]    Orders Added: 1)  Est. Patient Level IV [[address]]

## 2010-10-03 NOTE — Miscellaneous (Signed)
  Clinical Lists Changes  Medications: Added new medication of FLUCONAZOLE 150 MG TABS (FLUCONAZOLE) 1 tab by mouth once daily x 3d - Signed Rx of FLUCONAZOLE 150 MG TABS (FLUCONAZOLE) 1 tab by mouth once daily x 3d;  #3 x 1;  Signed;  Entered by: Michell Heinrich M.D.;  Authorized by: Michell Heinrich M.D.;  Method used: Electronically to [facility]*, [address] Shipping/mailing, [place], [place]  [address], Ph: [phone], Fax: [phone]    Prescriptions: FLUCONAZOLE 150 MG TABS (FLUCONAZOLE) 1 tab by mouth once daily x 3d  #3 x 1   Entered and Authorized by:   Michell Heinrich M.D.   Signed by:   Michell Heinrich M.D. on 09/29/2010   Method used:   Electronically to        [facility]* (retail)       [address]       [address] [place] [address]/mailing       [place], [place]  [address]       Ph: [phone]       Fax: [phone]   RxID:   [id number]

## 2010-10-04 ENCOUNTER — Other Ambulatory Visit: Payer: Self-pay | Admitting: Family Medicine

## 2010-10-04 DIAGNOSIS — R1013 Epigastric pain: Secondary | ICD-10-CM

## 2010-10-04 DIAGNOSIS — R110 Nausea: Secondary | ICD-10-CM

## 2010-10-04 DIAGNOSIS — R634 Abnormal weight loss: Secondary | ICD-10-CM

## 2010-10-04 DIAGNOSIS — R509 Fever, unspecified: Secondary | ICD-10-CM

## 2010-10-05 ENCOUNTER — Ambulatory Visit: Payer: Self-pay | Admitting: Family Medicine

## 2010-10-05 ENCOUNTER — Telehealth: Payer: Self-pay | Admitting: Family Medicine

## 2010-10-06 ENCOUNTER — Ambulatory Visit (HOSPITAL_COMMUNITY)
Admission: RE | Admit: 2010-10-06 | Discharge: 2010-10-06 | Disposition: A | Discharge status: Home / Self Care | Payer: Commercial Managed Care - PPO | Source: Ambulatory Visit | Attending: Family Medicine | Admitting: Family Medicine

## 2010-10-06 ENCOUNTER — Other Ambulatory Visit: Payer: Self-pay | Admitting: Family Medicine

## 2010-10-06 DIAGNOSIS — R109 Unspecified abdominal pain: Secondary | ICD-10-CM | POA: Insufficient documentation

## 2010-10-06 DIAGNOSIS — R1013 Epigastric pain: Secondary | ICD-10-CM

## 2010-10-06 DIAGNOSIS — R935 Abnormal findings on diagnostic imaging of other abdominal regions, including retroperitoneum: Secondary | ICD-10-CM | POA: Insufficient documentation

## 2010-10-06 DIAGNOSIS — R634 Abnormal weight loss: Secondary | ICD-10-CM

## 2010-10-06 DIAGNOSIS — R509 Fever, unspecified: Secondary | ICD-10-CM | POA: Insufficient documentation

## 2010-10-06 DIAGNOSIS — R110 Nausea: Secondary | ICD-10-CM

## 2010-10-06 DIAGNOSIS — R55 Syncope and collapse: Secondary | ICD-10-CM | POA: Insufficient documentation

## 2010-10-06 MED ORDER — GADOBENATE DIMEGLUMINE 529 MG/ML IV SOLN
17.0000 mL | Freq: Once | INTRAVENOUS | Status: AC | PRN
  Administered 2010-10-06: 17 mL via INTRAVENOUS

## 2010-10-12 NOTE — Miscellaneous (Signed)
  Clinical Lists Changes  Observations: Added new observation of PAST SURG HX: Bunionectomy right foot TAH and BSO for endometriosis 2003 Lap chole 2005 (cholelithiasis w/choledocholithiasis on u/s 2005--PreOperative ERCP showed NO stone or other obstruction in the biliary tract, but CBD measured 9mm near head of pancreas.  A sphincterotomy and bile duct balloon dredging was done at that time). CT abd/pelv done for fever/wt loss w/u 08/2010 showed NO CHANGE in CBD measurement). OTIF second and third metatarsals 06/05/05 by Dr. Romeo Apple   (10/02/2010 16:34) Added new observation of PAST MED HX: Lumbar DDD Chronic Headaches Anxiety Disorder Fibromyalgia (Dr. Consuella Lose [phone]) Interstitial Cystitis obesity Insomnia Mild transaminasemia 03/2010 (ALT 49), normal on f/u testing off of daily tylenol use. Dyspepsia/gastritis/GERD: EGD 06/01/2010: gastritis (NSAIDs) Anorectal bleeding/BRBPR--Colonoscopy 06/01/2010: internal hemorrhoids Recurrent UTIs: renal u/s normal 07/2007 Wt. loss, recurrent fevers:  CT abd/pelv 08/22/10 remarkable only for bile duct 9mm (unchanged from 03/2004 PRIOR to ERCP w/sphincterotomy, bile duct balloon dredging, and lap chole).   Asthma Osteopenia (bone densitometry 2006 per pt)   (10/02/2010 16:34) Added new observation of PRIMARY MD: Aneta Mins Yoltzin Ransom,MD (10/02/2010 16:34)       Past History:  Past Medical History: Lumbar DDD Chronic Headaches Anxiety Disorder Fibromyalgia (Dr. Consuella Lose [phone]) Interstitial Cystitis obesity Insomnia Mild transaminasemia 03/2010 (ALT 49), normal on f/u testing off of daily tylenol use. Dyspepsia/gastritis/GERD: EGD 06/01/2010: gastritis (NSAIDs) Anorectal bleeding/BRBPR--Colonoscopy 06/01/2010: internal hemorrhoids Recurrent UTIs: renal u/s normal 07/2007 Wt. loss, recurrent fevers:  CT abd/pelv 08/22/10 remarkable only for bile duct 9mm (unchanged from 03/2004 PRIOR to ERCP w/sphincterotomy, bile duct balloon  dredging, and lap chole).   Asthma Osteopenia (bone densitometry 2006 per pt)  Past Surgical History: Bunionectomy right foot TAH and BSO for endometriosis 2003 Lap chole 2005 (cholelithiasis w/choledocholithiasis on u/s 2005--PreOperative ERCP showed NO stone or other obstruction in the biliary tract, but CBD measured 9mm near head of pancreas.  A sphincterotomy and bile duct balloon dredging was done at that time). CT abd/pelv done for fever/wt loss w/u 08/2010 showed NO CHANGE in CBD measurement). OTIF second and third metatarsals 06/05/05 by Dr. Romeo Apple

## 2010-10-12 NOTE — Miscellaneous (Signed)
  Clinical Lists Changes  Problems: Added new problem of ABDOMINAL PAIN, EPIGASTRIC (ICD-789.06) Added new problem of NAUSEA (ICD-787.02) Orders: Added new Referral order of Radiology Referral (Radiology) - Signed Added new Test order of T-Comprehensive Metabolic Panel [phone]) - Signed Added new Test order of T-CBC w/Diff [phone]) - Signed Added new Test order of T-CK Total [id number]) - Signed Added new Test order of T-Lipase [phone]) - Signed Added new Test order of T-Sed Rate (Automated) [phone]) - Signed Added new Test order of T-Phosphorus ([id number]) - Signed Added new Test order of TLB-GGT (Gamma GT) ([id number]) - Signed Observations: Added new observation of HPI: Spoke with patient on the phone today. After reviewing details of her case again, I want to look closer for evidence of biliary stenosis with an MRCP.  I also want to repeat some of her lab work DURING one of her acute febrile episodes, if possible. She is agreeable to this plan so will work on setting these things up at [facility] [place]. (10/03/2010 15:20) Added new observation of PRIMARY MD: Gaynelle Arabian (10/03/2010 15:20)       History of Present Illness: Spoke with patient on the phone today. After reviewing details of her case again, I want to look closer for evidence of biliary stenosis with an MRCP.  I also want to repeat some of her lab work DURING one of her acute febrile episodes, if possible. She is agreeable to this plan so will work on setting these things up at [facility] and [facility] in [place].

## 2010-10-12 NOTE — Progress Notes (Signed)
  Phone Note Other Incoming   Summary of Call: [facility] radiologist called and said they now do their MRCPs with contrast, so I'll send a new order for her MRCP to be WITH contrast. Initial call taken by: Michell Heinrich M.D.,  October 05, 2010 3:11 PM

## 2010-10-12 NOTE — Miscellaneous (Signed)
  Clinical Lists Changes  Observations: Added new observation of PAST SURG HX: Bunionectomy right foot TAH and BSO for endometriosis 2003 Lap chole 2005 (cholelithiasis w/choledocholithiasis on u/s 2005--PreOperative ERCP showed NO stone or other obstruction in the biliary tract, but CBD measured 9mm near head of pancreas.  A sphincterotomy and bile duct dredging was done at that time). CT abd/pelv done for fever/wt loss w/u 08/2010 showed NO CHANGE in CBD measurement). OTIF second and third metatarsals 06/05/05 by Dr. Romeo Apple   (10/02/2010 16:19) Added new observation of PAST MED HX: Lumbar DDD Chronic Headaches Anxiety Disorder Fibromyalgia (Dr. Consuella Lose [phone]) Interstitial Cystitis obesity Insomnia Mild transaminasemia 03/2010 (ALT 49), normal on f/u testing off of daily tylenol use. Dyspepsia/gastritis/GERD: EGD 06/01/2010: gastritis (NSAIDs) Anorectal bleeding/BRBPR--Colonoscopy 06/01/2010: internal hemorrhoids Recurrent UTIs: renal u/s normal 07/2007 Wt. loss, recurrent fevers:  CT abd/pelv 08/22/10 remarkable only for bile duct 9mm (unchanged from 03/2004 PRIOR to ERCP w/sphincterotomy, bile duct dredging, and lap chole).   Asthma Osteopenia (bone densitometry 2006 per pt)   (10/02/2010 16:19) Added new observation of PRIMARY MD: Aneta Mins Nygeria Lager,MD (10/02/2010 16:19)       Past History:  Past Medical History: Lumbar DDD Chronic Headaches Anxiety Disorder Fibromyalgia (Dr. Consuella Lose [phone]) Interstitial Cystitis obesity Insomnia Mild transaminasemia 03/2010 (ALT 49), normal on f/u testing off of daily tylenol use. Dyspepsia/gastritis/GERD: EGD 06/01/2010: gastritis (NSAIDs) Anorectal bleeding/BRBPR--Colonoscopy 06/01/2010: internal hemorrhoids Recurrent UTIs: renal u/s normal 07/2007 Wt. loss, recurrent fevers:  CT abd/pelv 08/22/10 remarkable only for bile duct 9mm (unchanged from 03/2004 PRIOR to ERCP w/sphincterotomy, bile duct dredging, and lap chole).    Asthma Osteopenia (bone densitometry 2006 per pt)  Past Surgical History: Bunionectomy right foot TAH and BSO for endometriosis 2003 Lap chole 2005 (cholelithiasis w/choledocholithiasis on u/s 2005--PreOperative ERCP showed NO stone or other obstruction in the biliary tract, but CBD measured 9mm near head of pancreas.  A sphincterotomy and bile duct dredging was done at that time). CT abd/pelv done for fever/wt loss w/u 08/2010 showed NO CHANGE in CBD measurement). OTIF second and third metatarsals 06/05/05 by Dr. Romeo Apple

## 2010-10-16 ENCOUNTER — Ambulatory Visit: Payer: Self-pay | Admitting: Infectious Diseases

## 2010-10-19 ENCOUNTER — Emergency Department (HOSPITAL_COMMUNITY)
Admission: EM | Admit: 2010-10-19 | Discharge: 2010-10-19 | Disposition: A | Discharge status: Other Acute Inpatient Hospital | Payer: 59 | Source: Home / Self Care | Attending: Emergency Medicine | Admitting: Emergency Medicine

## 2010-10-19 ENCOUNTER — Telehealth: Payer: Self-pay | Admitting: Family Medicine

## 2010-10-19 ENCOUNTER — Inpatient Hospital Stay (HOSPITAL_COMMUNITY)
Admission: AD | Admit: 2010-10-19 | Discharge: 2010-10-21 | DRG: 287 | Disposition: A | Discharge status: Home / Self Care | Payer: 59 | Source: Other Acute Inpatient Hospital | Attending: Cardiology | Admitting: Cardiology

## 2010-10-19 ENCOUNTER — Emergency Department (HOSPITAL_COMMUNITY): Payer: 59

## 2010-10-19 DIAGNOSIS — I514 Myocarditis, unspecified: Secondary | ICD-10-CM | POA: Diagnosis present

## 2010-10-19 DIAGNOSIS — A498 Other bacterial infections of unspecified site: Secondary | ICD-10-CM | POA: Diagnosis present

## 2010-10-19 DIAGNOSIS — R079 Chest pain, unspecified: Secondary | ICD-10-CM

## 2010-10-19 DIAGNOSIS — I200 Unstable angina: Secondary | ICD-10-CM | POA: Insufficient documentation

## 2010-10-19 DIAGNOSIS — N390 Urinary tract infection, site not specified: Secondary | ICD-10-CM | POA: Diagnosis present

## 2010-10-19 DIAGNOSIS — K219 Gastro-esophageal reflux disease without esophagitis: Secondary | ICD-10-CM | POA: Diagnosis present

## 2010-10-19 DIAGNOSIS — J45909 Unspecified asthma, uncomplicated: Secondary | ICD-10-CM | POA: Insufficient documentation

## 2010-10-19 DIAGNOSIS — R112 Nausea with vomiting, unspecified: Secondary | ICD-10-CM | POA: Diagnosis present

## 2010-10-19 DIAGNOSIS — IMO0001 Reserved for inherently not codable concepts without codable children: Secondary | ICD-10-CM | POA: Insufficient documentation

## 2010-10-19 DIAGNOSIS — E871 Hypo-osmolality and hyponatremia: Secondary | ICD-10-CM | POA: Diagnosis present

## 2010-10-19 DIAGNOSIS — I201 Angina pectoris with documented spasm: Principal | ICD-10-CM | POA: Diagnosis present

## 2010-10-19 LAB — CARDIAC PANEL(CRET KIN+CKTOT+MB+TROPI)
CK, MB: 1 ng/mL (ref 0.3–4.0)
Relative Index: INVALID (ref 0.0–2.5)
Total CK: 38 U/L (ref 7–177)

## 2010-10-19 LAB — URINALYSIS, ROUTINE W REFLEX MICROSCOPIC
Bilirubin Urine: NEGATIVE
Ketones, ur: 15 mg/dL — AB
Nitrite: NEGATIVE
Urobilinogen, UA: 0.2 mg/dL (ref 0.0–1.0)

## 2010-10-19 LAB — DIFFERENTIAL
Eosinophils Absolute: 0 10*3/uL (ref 0.0–0.7)
Lymphs Abs: 0.7 10*3/uL (ref 0.7–4.0)
Neutro Abs: 8.9 10*3/uL — ABNORMAL HIGH (ref 1.7–7.7)
Neutrophils Relative %: 90 % — ABNORMAL HIGH (ref 43–77)

## 2010-10-19 LAB — POCT CARDIAC MARKERS
CKMB, poc: <1 ng/mL — ABNORMAL LOW (ref 1.0–8.0)
Troponin i, poc: 0.33 ng/mL (ref 0.00–0.09)
Troponin i, poc: 0.37 ng/mL (ref 0.00–0.09)

## 2010-10-19 LAB — CBC
Hemoglobin: 12.2 g/dL (ref 12.0–15.0)
MCV: 81.1 fL (ref 78.0–100.0)
Platelets: 252 10*3/uL (ref 150–400)
RBC: 4.33 MIL/uL (ref 3.87–5.11)
WBC: 10 10*3/uL (ref 4.0–10.5)

## 2010-10-19 LAB — BASIC METABOLIC PANEL
BUN: 5 mg/dL — ABNORMAL LOW (ref 6–23)
CO2: 23 mEq/L (ref 19–32)
Chloride: 93 mEq/L — ABNORMAL LOW (ref 96–112)
Creatinine, Ser: 0.79 mg/dL (ref 0.4–1.2)
Glucose, Bld: 127 mg/dL — ABNORMAL HIGH (ref 70–99)
Potassium: 3.7 mEq/L (ref 3.5–5.1)

## 2010-10-19 LAB — HEPARIN LEVEL (UNFRACTIONATED): Heparin Unfractionated: <0.1 IU/mL — ABNORMAL LOW (ref 0.30–0.70)

## 2010-10-19 LAB — MRSA PCR SCREENING: MRSA by PCR: NEGATIVE

## 2010-10-19 LAB — D-DIMER, QUANTITATIVE: D-Dimer, Quant: 0.22 ug/mL-FEU (ref 0.00–0.48)

## 2010-10-20 ENCOUNTER — Telehealth: Payer: Self-pay | Admitting: Family Medicine

## 2010-10-20 DIAGNOSIS — R072 Precordial pain: Secondary | ICD-10-CM

## 2010-10-20 LAB — MAGNESIUM: Magnesium: 2.3 mg/dL (ref 1.5–2.5)

## 2010-10-20 LAB — CARDIAC PANEL(CRET KIN+CKTOT+MB+TROPI)
Relative Index: INVALID (ref 0.0–2.5)
Relative Index: INVALID (ref 0.0–2.5)

## 2010-10-20 LAB — CBC
MCH: 28.2 pg (ref 26.0–34.0)
MCHC: 33.1 g/dL (ref 30.0–36.0)
Platelets: 224 10*3/uL (ref 150–400)
RDW: 15 % (ref 11.5–15.5)

## 2010-10-20 LAB — COMPREHENSIVE METABOLIC PANEL
ALT: 31 U/L (ref 0–35)
AST: 16 U/L (ref 0–37)
CO2: 29 mEq/L (ref 19–32)
Calcium: 8.7 mg/dL (ref 8.4–10.5)
Chloride: 101 mEq/L (ref 96–112)
GFR calc Af Amer: >60 mL/min (ref >60)
GFR calc non Af Amer: >60 mL/min (ref >60)
Sodium: 135 mEq/L (ref 135–145)

## 2010-10-20 LAB — HEMOGLOBIN A1C: Hgb A1c MFr Bld: 6 % — ABNORMAL HIGH (ref <5.7)

## 2010-10-20 LAB — HEPARIN LEVEL (UNFRACTIONATED): Heparin Unfractionated: 0.26 IU/mL — ABNORMAL LOW (ref 0.30–0.70)

## 2010-10-20 LAB — LIPID PANEL: HDL: 55 mg/dL (ref >39)

## 2010-10-21 ENCOUNTER — Encounter: Payer: Self-pay | Admitting: Family Medicine

## 2010-10-21 LAB — URINE CULTURE
Colony Count: 80000
Culture  Setup Time: 201203152216

## 2010-10-21 LAB — CBC
MCH: 28.4 pg (ref 26.0–34.0)
MCHC: 33.2 g/dL (ref 30.0–36.0)
Platelets: 226 10*3/uL (ref 150–400)
RBC: 3.7 MIL/uL — ABNORMAL LOW (ref 3.87–5.11)
RDW: 14.8 % (ref 11.5–15.5)

## 2010-10-22 NOTE — Discharge Summary (Addendum)
Alexandra Reid, Alexandra Reid               ACCOUNT NO.:  [id number]  MEDICAL RECORD NO.:  [id number]           PATIENT TYPE:  I  LOCATION:  2022                         FACILITY:  [facility]  PHYSICIAN:  Marca Ancona, MD      DATE OF BIRTH:  Oct 06, 1965  DATE OF ADMISSION:  10/19/2010 DATE OF DISCHARGE:  10/21/2010                              DISCHARGE SUMMARY   DISCHARGE DIAGNOSES: 1. Left-sided chest pain with positive point-of-care markers of 0.37     and 0.33, but full cardiac enzymes negative x3 (uncertain     significance). 2. Normal coronary arteries by cath on October 19, 2010, treated for     possible coronary vasospasm with amlodipine 5 mg daily. 3. Escherichia coli urinary tract infection, pan sensitive, to be     treated with 3-day course of ciprofloxacin. 4. Gastroesophageal reflux disease. 5. History of gastritis. 6. History of cholecystitis with cholelithiasis status post endoscopic     retrograde cholangiopancreatography with sphincterotomy and     cholecystectomy. 7. Hysterectomy. 8. Foot surgery. 9. Intermittent illness with temperature greater than or equal to 102     degrees Fahrenheit, nausea, vomiting, and headache, for outpatient     ID evaluation that has already been scheduled.  HOSPITAL COURSE:  Alexandra Reid is a 45 year old female with a history of cyclical fever, nausea and vomiting, and no prior history of coronary artery disease, who presented initially to [facility] with complaints of chest pain and fevers.  Point-of-care troponins were positive x2 as noted above, but regular lab draw troponins were completely normal, but done a couple of hours later.  She had a cardiac catheterization on October 20, 2010, done which showed normal coronary arteries and normal left ventricular function.  Dr. Excell Seltzer, is uncertain of the etiology of the patient's chest pain.  Dr. Shirlee Latch has started her empirically on amlodipine today for possible coronary vasospasm.  He  would like her to continue her home PPIs at discharge.  She was also diagnosed with E. coli UTI and will be given a prescription for ciprofloxacin at discharge as previously discussed with Dr. Shirlee Latch.  Dr. Shirlee Latch has seen and examined her today and feels she is stable for discharge.  DISCHARGE LABS:  WBC 8, hemoglobin 10.5, hematocrit 31.6, and platelet count 226.  Sodium 135, potassium 3.9, chloride 101, CO2 of 29, glucose 96, BUN 6, creatinine 0.72.  LFTs within normal limits on October 20, 2010, with exception of albumin at 3.3, A1c is 6.0.  Cardiac enzymes as noted above.  TSH 2.067.  STUDIES: 1. Chest x-ray on October 19, 2010, showed no active disease, no     significant change. 2. Cardiac catheterization. 3. On October 20, 2010, please see full report for details as well as     HPI for summary.  DISCHARGE MEDICATIONS: 1. Amlodipine 5 mg daily. 2. Aspirin 81 mg daily. 3. Cipro 250 mg 1 tablet daily. 4. Ambien 10 mg nightly. 5. Amitriptyline 100 mg daily. 6. Cymbalta 30 mg daily. 7. Nexium 40 mg b.i.d. 8. Oxycodone 5 mg 1-2 tablets every 4 hours as needed. 9. Soma 250 mg  b.i.d.  Please note that disclaimer was written on the patient's discharge medication list with instructions to call her PCP to discuss the combination of psychotropic medications on her which include Cymbalta, Ambien, oxycodone, and Soma.  She is instructed to call her PCP to determine if she should continue taking all of these combination.  DISPOSITION:  The patient discharged instable condition to home.  She is not to lift anything for 1 week, participate any sexual activity for 1 week, or drive for 2 days.  She is to follow low-sodium, heart-health diet.  If she notices any pain, swelling, bleeding, or pus from the cath site, she is to call us or return.  She will follow up with a physician in [place], [facility], to be determined if the patient has never seen as before.  Our office will call  her to schedule a followup appointment.  She is also instructed to keep her Infectious Disease appointment.  DURATION OF DISCHARGE ENCOUNTER:  Greater than 30 minutes including physician and PA time.     Ronie Spies, P.A.C.   ______________________________ Marca Ancona, MD    DD/MEDQ  D:  10/21/2010  T:  10/22/2010  Job:  [id number]  cc:   [facility] Jeoffrey Massed, MD  Electronically Signed by Ronie Spies  on 10/22/2010 02:06:16 PM Electronically Signed by Marca Ancona MD on 10/27/2010 08:33:11 AM

## 2010-10-23 ENCOUNTER — Encounter: Payer: Self-pay | Admitting: Family Medicine

## 2010-10-24 NOTE — Procedures (Signed)
  [name], Alexandra Reid               ACCOUNT NO.:  [id number]  MEDICAL RECORD NO.:  [id number]           PATIENT TYPE:  I  LOCATION:  2022                         FACILITY:  [facility]  PHYSICIAN:  Veverly Fells. Excell Seltzer, MD  DATE OF BIRTH:  March 03, 1966  DATE OF PROCEDURE:  10/20/2010 DATE OF DISCHARGE:                           CARDIAC CATHETERIZATION   PROCEDURES: 1. Left heart catheterization. 2. Selective coronary angiography. 3. Left ventricular angiography.  PROCEDURAL INDICATIONS:  Alexandra Reid is a 45 year old woman who presented with chest pain in the setting of systemic illness where she had nausea, vomiting, and fever.  She awoke with chest pain yesterday morning and ruled in for myocardial infarction with positive troponin. She was referred for cardiac cath.  Risks and indications of procedure reviewed with the patient and informed consent was obtained.  The right wrist was prepped, draped and anesthetized with 1% lidocaine using modified Seldinger technique.  A 5- French sheath was placed in the right radial artery.  Standard Judkins catheters were used for coronary angiography and left ventriculography. All catheter exchanges were performed over guidewire.  The patient tolerated the procedure well.  After sheath insertion, the patient was given 4000 units of unfractionated heparin intravenously and 3 mg of verapamil through the radial sheath.  PROCEDURAL FINDINGS:  Aortic pressure 110/69 with a mean of 89, left ventricular pressure 110/12.  Left ventriculography shows low normal left ventricular ejection fraction.  There are no regional wall motion abnormalities identified. The left ventricular ejection fraction is estimated at 50-55%.  CORONARY ANGIOGRAPHY:  The right coronary artery is small and nondominant.  It is smooth throughout its course without significant angiographic disease.  Left mainstem:  The left main is widely patent.  There is no  significant obstructive disease visualized.  LAD:  The LAD is widely patent to the apex.  There are no diagonal branches arising from the LAD.  The vessel was smooth throughout its course and has no significant obstructive disease.  Left circumflex:  The left circumflex is large.  There is an intermediate branch present that has no stenosis.  The circumflex gives off two OM branches as well as a left PDA.  There was no significant stenosis throughout the circumflex or its branch vessels.  ASSESSMENT: 1. Normal coronary arteries. 2. Normal left ventricular function.  DISCUSSION:  I am sure the etiology of the patient's chest pain or cardiac enzyme rise.  It is possible that she has mild myocarditis or coronary vasospasm.  In any event, she will be treated medically in an absence of obstructive CAD.     Veverly Fells. Excell Seltzer, MD     MDC/MEDQ  D:  10/20/2010  T:  10/21/2010  Job:  [id number]  cc:   Kirk Ruths, M.D.  Electronically Signed by Tonny Bollman MD on 10/24/2010 06:04:20 PM

## 2010-10-24 NOTE — Progress Notes (Signed)
Summary: ID appt  Phone Note Other Incoming   Summary of Call: She is in [facility] for CP and will miss her outpatient ID consult today.  Please call ID office and let them know.  Thanks. Initial call taken by: Michell Heinrich M.D.,  October 20, 2010 8:34 AM  Follow-up for Phone Call        message left on ID voicemail to please return call regarding this patient.  I will try again later. Francee Piccolo CMA Duncan Dull)  October 20, 2010 8:52 AM   I left a detailed message on ID voicemail that pt is inpatient at [facility] and will be unable to make appt today.  Asked them to call our office if necessary to reschedule pt and to please not mark this pt as a no-show today. Follow-up by: Francee Piccolo CMA Duncan Dull),  October 20, 2010 12:38 PM

## 2010-10-24 NOTE — Progress Notes (Signed)
Summary: Chest Pain  Phone Note Call from Patient Call back at Home Phone [phone]   Caller: Patient Reason for Call: Acute Illness Complaint: Chest Pain Action Taken: Provider Notified, Patient advised to go to ER, Patient advised to call 911 Summary of Call: pt states she has had two episodes of pain this AM.  The first at 9:00 then at 10:30.  Pt states she is having pain and pressure.  During her last episode of pain it radiated to her jaw.  Pt states she has not felt well this week and wonders if her potassium could be low.  Advised pt she needs to go to ED.  Advised as she is having chest pain radiating to jaw to call 911.  Pt prefers to have someone drive her to ED.  Initial call taken by: Francee Piccolo CMA Duncan Dull),  October 19, 2010 12:08 PM  Follow-up for Phone Call        Agree. Follow-up by: Michell Heinrich M.D.,  October 19, 2010 1:31 PM

## 2010-10-26 ENCOUNTER — Encounter: Payer: Self-pay | Admitting: Family Medicine

## 2010-10-26 ENCOUNTER — Ambulatory Visit (INDEPENDENT_AMBULATORY_CARE_PROVIDER_SITE_OTHER): Payer: 59 | Admitting: Family Medicine

## 2010-10-26 DIAGNOSIS — Z79899 Other long term (current) drug therapy: Secondary | ICD-10-CM

## 2010-10-26 DIAGNOSIS — F419 Anxiety disorder, unspecified: Secondary | ICD-10-CM

## 2010-10-26 DIAGNOSIS — N390 Urinary tract infection, site not specified: Secondary | ICD-10-CM

## 2010-10-26 DIAGNOSIS — I201 Angina pectoris with documented spasm: Secondary | ICD-10-CM

## 2010-10-26 DIAGNOSIS — R079 Chest pain, unspecified: Secondary | ICD-10-CM | POA: Insufficient documentation

## 2010-10-26 DIAGNOSIS — IMO0001 Reserved for inherently not codable concepts without codable children: Secondary | ICD-10-CM

## 2010-10-26 DIAGNOSIS — M797 Fibromyalgia: Secondary | ICD-10-CM

## 2010-10-26 DIAGNOSIS — F411 Generalized anxiety disorder: Secondary | ICD-10-CM

## 2010-10-26 DIAGNOSIS — A689 Relapsing fever, unspecified: Secondary | ICD-10-CM

## 2010-10-26 LAB — POCT URINALYSIS DIPSTICK
Glucose, UA: NEGATIVE
Nitrite, UA: NEGATIVE
Spec Grav, UA: 1.01
Urobilinogen, UA: 0.2

## 2010-10-26 MED ORDER — AMLODIPINE BESYLATE 5 MG PO TABS: 5.0000 mg | ORAL_TABLET | Freq: Every day | ORAL | Status: DC

## 2010-10-26 MED ORDER — CIPROFLOXACIN HCL 500 MG PO TABS: 250.0000 mg | ORAL_TABLET | Freq: Two times a day (BID) | ORAL | Status: DC

## 2010-10-26 MED ORDER — PHENAZOPYRIDINE HCL 200 MG PO TABS: 200.0000 mg | ORAL_TABLET | Freq: Three times a day (TID) | ORAL | Status: DC | PRN

## 2010-10-26 MED ORDER — OXYCODONE HCL 5 MG PO TABS: ORAL_TABLET | ORAL | Status: DC

## 2010-10-26 MED ORDER — CLONAZEPAM 1 MG PO TABS: 1.0000 mg | ORAL_TABLET | Freq: Three times a day (TID) | ORAL | Status: DC

## 2010-10-26 MED ORDER — OXYCODONE HCL 60 MG PO TB12: 1.0000 | ORAL_TABLET | Freq: Two times a day (BID) | ORAL | Status: DC

## 2010-10-26 NOTE — Assessment & Plan Note (Signed)
Coronary vasospasm. Start amlodipine 5mg  qd as per cardiologist's recommendation.

## 2010-10-26 NOTE — Assessment & Plan Note (Signed)
No changes with 59mo of cymbalta 30mg .  Increase to 60mg  qd. RF'd oxycontin and oxycodone.  Therapeutic expectations and side effect profile of medication discussed today.  Patient's questions answered. Urine sent for UDS today, as per monitoring this patient on chronic narcotic pain mgmt.

## 2010-10-26 NOTE — Assessment & Plan Note (Signed)
Change clonaz to 1mg  q8h scheduled, new rx given today.

## 2010-10-26 NOTE — Progress Notes (Signed)
HPI:  45 y/o WF here for f/u fibromyalgia, recurrent fevers, recurrent abd pain/nausea. Since last visit she was hospitalized for chest pain.  She had two distinct episodes of SSCP at rest, rad to left jaw/shoulder, went to ED where POC TnI elevated x 2, other markers normal.  EKG normal. Admitted to [facility], where subsequent cardiac markers were normal, cardiac cath showed clean coronaries and normal EF.  Presumed dx of coronary artery vasospasm given, d/c'd home with rx for amlodipine 5mg  qd, which she hasn't filled yet.  Other labs abnl: Urine clx grew E.coli, pansensitive. HbA1c 6.1%.  Na 126 on day of admission, and 135 the following day.  VS were normal during hospitalization.  No CP since d/c home. No changes in general course/character of her recurrent fevers, abd pain, and chronic nausea. See past notes for details. Chronic pain med use unchanged: takes 2 oxycodone q4h consistently and oxycontin 60mg  bid. Has been on 30mg  cymbalta qd for a month now but b/c of illness her compliance has been <100%. She'll titrate up to 60mg  qd dose in a few days.  No adverse effects noted, no improvement in pain noted. Her ID consult is now 11/13/10---she missed her original one b/c of being in the hospital.    Past Medical History  Diagnosis Date  . DDD (degenerative disc disease), lumbar   . Headache   . Anxiety   . Fibromyalgia     Dr. Hope Pigeon [phone]  . Interstitial cystitis   . Obesity   . Insomnia   . Transaminasemia 03/2010    (ALT 49), normal on f/u testing off of daily tylenol uuse.  Marland Kitchen GERD (gastroesophageal reflux disease) 06/01/10    Dyspepsia/gastritis, EGD: gastritis (NSAIDS)  . Rectal bleeding 06/01/10    Anorectal bleeding/BRBPR--colonoscopy: internal hemorrhoids  . Recurrent UTI     renal u/s normal 07/2007  . Weight loss     CT abd/pelf 08/22/10 remakable only for bile duct 9mm (unchanged from 03/2004 PRIOR to ERCP w/spincterotomy, bile duct balloon redging and lap chole)    . Recurrent fever     CT abd/pelf 08/22/10 remakable only for bile duct 9mm (unchanged from 03/2004 PRIOR to ERCP w/spincterotomy, bile duct balloon redging and lap chole)  . Asthma   . Osteopenia     bone densitometry 2006 per pt  . 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 Christella Hartigan    Past Surgical History  Procedure Date  . Bunionectomy     right foot  . Abdominal hysterectomy   . Bilateral salpingoophorectomy 2003    endometriosis  . Cholecystectomy 2005    lap--(cholelithiasis w/choledocholithiasis on u/s 2005--PreOperative ERCP showed NO stone or other obstruction in the biliary tract, but CBD messured 9mm near head of pancreas.  S sphincterotomy and bile duct balloon dredging was done at that time.  MRCP 10/2010 NORMAL.  Marland Kitchen Orif metatarsal fracture 2006    Right 2nd and 3rd metatarsals (Dr. Romeo Apple)    Family History  Problem Relation Age of Onset  . Dementia Mother     Picks Disease  . Hypertension Mother   . Cancer Father     testicular/ melanoma  . Thyroid disease Sister   . Alcohol abuse Maternal Grandmother   . Other Maternal Grandmother     brain tumor (unsure if cancerous)  . Heart attack Maternal Grandfather   . Cancer Paternal Grandfather     colon    History   Social History  . Marital Status:  Married    Spouse Name: N/A    Number of Children: 2  . Years of Education: N/A   Occupational History  . Nurse [employer]   Social History Main Topics  . Smoking status: Never Smoker   . Smokeless tobacco: Never Used  . Alcohol Use: Yes     special occasion  . Drug Use: No  . Sexually Active: Yes -- Female partner(s)   Other Topics Concern  . Not on file   Social History Narrative  . No narrative on file    Current Outpatient Prescriptions on File Prior to Visit  Medication Sig Dispense Refill  . albuterol (PROAIR HFA) 108 (90 BASE) MCG/ACT inhaler Inhale 2 puffs into the lungs every 4 (four) hours as needed.        Marland Kitchen  amitriptyline (ELAVIL) 100 MG tablet Take 100 mg by mouth at bedtime.        . Ascorbic Acid (VITAMIN C) 500 MG tablet Take 500 mg by mouth daily.        . Calcium Carbonate (CALCIUM 500 PO) Take 1 tablet by mouth daily.        . carisoprodol (SOMA) 350 MG tablet Take 350 mg by mouth 3 (three) times daily as needed.        Marland Kitchen esomeprazole (NEXIUM) 40 MG capsule Take 40 mg by mouth daily before breakfast.        . Fish Oil OIL Take 2 capsules by mouth daily.        . Fluticasone-Salmeterol (ADVAIR DISKUS) 250-50 MCG/DOSE AEPB Inhale 1 puff into the lungs every 12 (twelve) hours.        . ondansetron (ZOFRAN-ODT) 8 MG disintegrating tablet Take 8 mg by mouth every 8 (eight) hours as needed. For nausea       . promethazine (PHENERGAN) 25 MG tablet Take 1-2 tablets every 6 hours as needed for nausea       . zolpidem (AMBIEN) 10 MG tablet Take 10 mg by mouth at bedtime as needed.        Marland Kitchen DISCONTD: clonazePAM (KLONOPIN) 1 MG tablet Take 1/2 to 1 tablet by mouth two times a day       . DISCONTD: oxyCODONE (ROXICODONE) 5 MG immediate release tablet Take 1-2 tablets by mouth every 4 hours as needed for breakthrough pain       . furosemide (LASIX) 20 MG tablet Take 20 mg by mouth as needed.         No Known Allergies  ROS Review of Systems  Constitutional: Positive for fever (recurrent q7-10d to 102F), weight loss and malaise/fatigue.  HENT: Positive for sore throat. Negative for hearing loss and tinnitus.   Eyes: Negative for blurred vision, double vision and pain.  Respiratory: Negative for cough, shortness of breath and wheezing.   Cardiovascular: Negative for palpitations.  Gastrointestinal: Positive for heartburn, nausea and constipation. Negative for vomiting, diarrhea, blood in stool and melena.  Genitourinary: Positive for dysuria (rare). Negative for hematuria.  Musculoskeletal: Positive for myalgias and back pain. Negative for falls.  Skin: Negative for rash.  Neurological: Positive for  headaches. Negative for dizziness, tremors and focal weakness.  Endo/Heme/Allergies: Negative for polydipsia.  Psychiatric/Behavioral: Positive for depression. Negative for suicidal ideas. The patient is nervous/anxious.      PE VS: noted, all normal. Gen: Alert, well appearing, oriented x 4. ENT: throat without erythema or swelling but some white PND noted. necessary Neck: supple, ROM full.  Carotids 2+ bilat, without bruit.  No lymphadenopathy, thyromegaly, or mass. Chest: symmetric expansion, nonlabored respirations.  Clear and equal breath sounds in all lung fields.   CV: RRR, no m/r/g.  Peripheral pulses 2+ and symmetric. ABD: soft, nondistended.  NO hsm or mass.  Mild periumbillical/midepigastric TTP.  BS normal. No rebound or guarding. EXT: no clubbing, cyanosis, or edema.  SKIN: no rash, no pallor, no jaundice  ASSESSMENT AND PLAN:  ANXIETY STATE, UNSPECIFIED Change clonaz to 1mg  q8h scheduled, new rx given today.  FIBROMYALGIA, SEVERE No changes with 21mo of cymbalta 30mg .  Increase to 60mg  qd. RF'd oxycontin and oxycodone.  Therapeutic expectations and side effect profile of medication discussed today.  Patient's questions answered. Urine sent for UDS today, as per monitoring this patient on chronic narcotic pain mgmt.  FEVER, RECURRENT Ongoing.  Extensive lab and radiologic w/u unrevealing.  Physical exam findings minimal/nonspecific.  Wt loss more likely tied to chronic nausea with poor PO intake than to hypermetabolic pathophysiologic process. ID consult scheduled for 11/13/10 with Dr. Ninetta Lights.  Chest pain Coronary vasospasm. Start amlodipine 5mg  qd as per cardiologist's recommendation.  UTI (urinary tract infection) Her clx in hosp 10/21/10 showed E. Coli-pansensitive.  She has not filled the rx for cipro they gave her at d/c.  I have eRX'd cipro 250mg  bid x 3d to her pharmacy. Urine today was NOT sent for culture.     No problem-specific assessment & plan notes  found for this encounter.

## 2010-10-26 NOTE — Progress Notes (Signed)
Addended by: Nicoletta Ba on: 10/26/2010 03:56 PM   Modules accepted: Orders

## 2010-10-26 NOTE — Progress Notes (Deleted)
Past Medical History  Diagnosis Date  . DDD (degenerative disc disease), lumbar   . Headache   . Anxiety   . Fibromyalgia     Dr. Hope Pigeon [phone]  . Interstitial cystitis   . Obesity   . Insomnia   . Transaminasemia 03/2010    (ALT 49), normal on f/u testing off of daily tylenol uuse.  Marland Kitchen GERD (gastroesophageal reflux disease) 06/01/10    Dyspepsia/gastritis, EGD: gastritis (NSAIDS)  . Rectal bleeding 06/01/10    Anorectal bleeding/BRBPR--colonoscopy: internal hemorrhoids  . Recurrent UTI     renal u/s normal 07/2007  . Weight loss     CT abd/pelf 08/22/10 remakable only for bile duct 9mm (unchanged from 03/2004 PRIOR to ERCP w/spincterotomy, bile duct balloon redging and lap chole)  . Recurrent fever     CT abd/pelf 08/22/10 remakable only for bile duct 9mm (unchanged from 03/2004 PRIOR to ERCP w/spincterotomy, bile duct balloon redging and lap chole)  . Asthma   . Osteopenia     bone densitometry 2006 per pt  . 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 Christella Hartigan    Past Surgical History  Procedure Date  . Bunionectomy     right foot  . Abdominal hysterectomy   . Bilateral salpingoophorectomy 2003    endometriosis  . Cholecystectomy 2005    lap--(cholelithiasis w/choledocholithiasis on u/s 2005--PreOperative ERCP showed NO stone or other obstruction in the biliary tract, but CBD messured 9mm near head of pancreas.  S sphincterotomy and bile duct balloon dredging was done at that time.  MRCP 10/2010 NORMAL.  Marland Kitchen Orif metatarsal fracture 2006    Right 2nd and 3rd metatarsals (Dr. Romeo Apple)    Family History  Problem Relation Age of Onset  . Dementia Mother     Picks Disease  . Hypertension Mother   . Cancer Father     testicular/ melanoma  . Thyroid disease Sister   . Alcohol abuse Maternal Grandmother   . Other Maternal Grandmother     brain tumor (unsure if cancerous)  . Heart attack Maternal Grandfather   . Cancer Paternal Grandfather    colon    History   Social History  . Marital Status: Married    Spouse Name: N/A    Number of Children: 2  . Years of Education: N/A   Occupational History  . Nurse [employer]   Social History Main Topics  . Smoking status: Never Smoker   . Smokeless tobacco: Never Used  . Alcohol Use: Yes     special occasion  . Drug Use: No  . Sexually Active: Yes -- Female partner(s)   Other Topics Concern  . Not on file   Social History Narrative  . No narrative on file    Current Outpatient Prescriptions on File Prior to Visit  Medication Sig Dispense Refill  . albuterol (PROAIR HFA) 108 (90 BASE) MCG/ACT inhaler Inhale 2 puffs into the lungs every 4 (four) hours as needed.        Marland Kitchen amitriptyline (ELAVIL) 100 MG tablet Take 100 mg by mouth at bedtime.        . Ascorbic Acid (VITAMIN C) 500 MG tablet Take 500 mg by mouth daily.        . Calcium Carbonate (CALCIUM 500 PO) Take 1 tablet by mouth daily.        . carisoprodol (SOMA) 350 MG tablet Take 350 mg by mouth 3 (three) times daily as needed.        Marland Kitchen  esomeprazole (NEXIUM) 40 MG capsule Take 40 mg by mouth daily before breakfast.        . Fish Oil OIL Take 2 capsules by mouth daily.        . Fluticasone-Salmeterol (ADVAIR DISKUS) 250-50 MCG/DOSE AEPB Inhale 1 puff into the lungs every 12 (twelve) hours.        . ondansetron (ZOFRAN-ODT) 8 MG disintegrating tablet Take 8 mg by mouth every 8 (eight) hours as needed. For nausea       . promethazine (PHENERGAN) 25 MG tablet Take 1-2 tablets every 6 hours as needed for nausea       . zolpidem (AMBIEN) 10 MG tablet Take 10 mg by mouth at bedtime as needed.        Marland Kitchen DISCONTD: clonazePAM (KLONOPIN) 1 MG tablet Take 1/2 to 1 tablet by mouth two times a day       . DISCONTD: oxyCODONE (ROXICODONE) 5 MG immediate release tablet Take 1-2 tablets by mouth every 4 hours as needed for breakthrough pain       . furosemide (LASIX) 20 MG tablet Take 20 mg by mouth as needed.         No Known  Allergies  ROS ***  PE ***  ASSESSMENT AND PLAN:   No problem-specific assessment & plan notes found for this encounter.

## 2010-10-26 NOTE — Assessment & Plan Note (Signed)
Ongoing.  Extensive lab and radiologic w/u unrevealing.  Physical exam findings minimal/nonspecific.  Wt loss more likely tied to chronic nausea with poor PO intake than to hypermetabolic pathophysiologic process. ID consult scheduled for 11/13/10 with Dr. Ninetta Lights.

## 2010-10-26 NOTE — Assessment & Plan Note (Signed)
Her clx in hosp 10/21/10 showed E. Coli-pansensitive.  She has not filled the rx for cipro they gave her at d/c.  I have eRX'd cipro 250mg  bid x 3d to her pharmacy. Urine today was NOT sent for culture.

## 2010-10-27 ENCOUNTER — Telehealth: Payer: Self-pay | Admitting: *Deleted

## 2010-10-27 ENCOUNTER — Ambulatory Visit: Payer: Commercial Managed Care - PPO | Admitting: Family Medicine

## 2010-10-27 LAB — DRUG SCREEN, URINE
Benzodiazepines.: NEGATIVE
Creatinine,U: 94.1 mg/dL
Phencyclidine (PCP): NEGATIVE
Propoxyphene: NEGATIVE

## 2010-10-27 MED ORDER — CIPROFLOXACIN HCL 500 MG PO TABS: 250.0000 mg | ORAL_TABLET | Freq: Two times a day (BID) | ORAL | Status: AC

## 2010-10-27 NOTE — Telephone Encounter (Signed)
Advised pt of above.  She is agreeable and voices understanding.

## 2010-10-27 NOTE — Patient Instructions (Signed)
Heartburn  Heartburn is a painful, burning sensation in the chest. It may feel worse in certain positions, such as lying down or bending over. It is caused by stomach acid backing up into the tube that carries food from the mouth down to the stomach (lower esophagus).    CAUSES  A number of conditions can cause or worsen heartburn, including:     Pregnancy.    Being overweight (obesity).    A condition called hiatal hernia, in which part or all of the stomach is moved up into the chest through a weakness in the diaphragm muscle.    Alcohol.    Exercise.    Eating just before going to bed.    Overeating.    Medications, including:    Nonsteroidal anti-inflammatory drugs, such as ibuprofen and naproxen.    Aspirin.    Some blood pressure medicines, including beta-blockers, calcium channel blockers, and alpha-blockers.    Nitrates (used to treat angina).    The asthma medication Theophylline.    Certain sedative drugs.    Heartburn may be worse after eating certain foods. These heartburn-causing foods are different for different people, but may include:    Peppers.    Chocolate.    Coffee.    High-fat foods, including fried foods.    Spicy foods.    Garlic, onions.    Citrus fruits, including oranges, grapefruit, lemons and limes.    Food containing tomatoes or tomato products.    Mint.    Carbonated beverages.    Vinegar.   SYMPTOMS    Symptoms may last for a few minutes or a few hours, and can include:   Burning pain in the chest or lower throat.    Bitter taste in the mouth.    Coughing.   DIAGNOSIS  If the usual treatments for heartburn do not improve your symptoms, then tests may be done to see if there is another condition present. Possible tests may include:   X-rays.    Endoscopy. This is when a tube with a light and a camera on the end is used to examine the esophagus and the stomach.    Blood, breath, or stool tests may be used to check for bacteria that cause ulcers.    TREATMENT  There are a number of non-prescription medicines used to treat heartburn, including:   Antacids.    Acid reducers (also called H-2 blockers).    Proton-pump inhibitors.   HOME CARE INSTRUCTIONS   Raise the head of your bed by putting blocks under the legs.    Eat 2-3 hours before going to bed.    Stop smoking.    Try to reach and maintain a healthy weight.    Do not eat just a few very large meals. Instead, eat many smaller meals throughout the day.    Try to identify foods and beverages that make your symptoms worse, and avoid these.    Avoid tight clothing.    Do not exercise right after eating.   SEEK IMMEDIATE MEDICAL CARE IF YOU:   Have severe chest pain that goes down your arm, or into your jaw or neck.    Feel sweaty, dizzy, or lightheaded.    Are short of breath.    Throw up (vomit) blood.    Have difficulty or pain with swallowing.    Have bloody or black, tarry stools.    Have bouts of heartburn more than three times a week for more than two weeks.   

## 2010-10-27 NOTE — Progress Notes (Signed)
Addended by: Nicoletta Ba on: 10/27/2010 10:12 AM   Modules accepted: Orders, Level of Service

## 2010-10-27 NOTE — Progress Notes (Signed)
No encounter today.  I printed an AVS and a cipro rx so we could make sure the printer routing was correct.--PM

## 2010-10-27 NOTE — Telephone Encounter (Signed)
Please let her know that NO ADDITIONAL PAIN MED will be prescribed.  She may apply ice to the area three times per day.

## 2010-10-27 NOTE — Telephone Encounter (Signed)
Alexandra Reid fell last night while rushing to the restroom.  She fell and hit her right side, near last two ribs, on side of tub.  She is having pain and has a burn mark near that area.  She is not having any shortness of breath.  Pt does not feel that she needs to be seen in UC or ED.  Pt is requesting a script for vicodin to get her through her acute rib pain.

## 2010-10-29 ENCOUNTER — Encounter: Payer: Self-pay | Admitting: Family Medicine

## 2010-10-30 ENCOUNTER — Encounter: Payer: Self-pay | Admitting: Family Medicine

## 2010-10-30 ENCOUNTER — Telehealth: Payer: Self-pay | Admitting: Family Medicine

## 2010-10-30 NOTE — Telephone Encounter (Signed)
Pt actually made appt to come in to speak to Dr. Milinda Cave tomorrow.  Pt has "a couple of things to go over" and would like Dr. Judie Petit to look at her ribs from fall on 10/27/10.

## 2010-10-30 NOTE — Telephone Encounter (Signed)
Pls call her

## 2010-10-31 ENCOUNTER — Encounter: Payer: 59 | Admitting: Family Medicine

## 2010-10-31 ENCOUNTER — Ambulatory Visit: Payer: 59 | Admitting: Family Medicine

## 2010-11-01 ENCOUNTER — Ambulatory Visit (INDEPENDENT_AMBULATORY_CARE_PROVIDER_SITE_OTHER): Payer: 59 | Admitting: Family Medicine

## 2010-11-01 ENCOUNTER — Encounter: Payer: Self-pay | Admitting: Family Medicine

## 2010-11-01 VITALS — BP 127/82 | HR 118 | Temp 98.1°F | Ht 64.5 in | Wt 185.0 lb

## 2010-11-01 DIAGNOSIS — R350 Frequency of micturition: Secondary | ICD-10-CM

## 2010-11-01 DIAGNOSIS — IMO0001 Reserved for inherently not codable concepts without codable children: Secondary | ICD-10-CM

## 2010-11-01 DIAGNOSIS — N390 Urinary tract infection, site not specified: Secondary | ICD-10-CM

## 2010-11-01 DIAGNOSIS — A689 Relapsing fever, unspecified: Secondary | ICD-10-CM

## 2010-11-01 DIAGNOSIS — N3000 Acute cystitis without hematuria: Secondary | ICD-10-CM

## 2010-11-01 DIAGNOSIS — S20219A Contusion of unspecified front wall of thorax, initial encounter: Secondary | ICD-10-CM

## 2010-11-01 LAB — POCT URINALYSIS DIPSTICK
Ketones, UA: NEGATIVE
Protein, UA: NEGATIVE
Spec Grav, UA: 1.01
Urobilinogen, UA: 0.2
pH, UA: 6

## 2010-11-01 MED ORDER — PHENAZOPYRIDINE HCL 200 MG PO TABS: 200.0000 mg | ORAL_TABLET | Freq: Three times a day (TID) | ORAL | Status: DC | PRN

## 2010-11-01 MED ORDER — FLUCONAZOLE 150 MG PO TABS: ORAL_TABLET | ORAL | Status: DC

## 2010-11-01 MED ORDER — POLYETHYLENE GLYCOL 3350 17 GM/SCOOP PO POWD: 17.0000 g | Freq: Every day | ORAL | Status: DC

## 2010-11-01 MED ORDER — SULFAMETHOXAZOLE-TRIMETHOPRIM 400-80 MG PO TABS: 1.0000 | ORAL_TABLET | Freq: Two times a day (BID) | ORAL | Status: DC

## 2010-11-01 NOTE — Assessment & Plan Note (Signed)
Feeling a bit better already.  No x-ray needed at this time.  Continue heat/ice prn.

## 2010-11-01 NOTE — Assessment & Plan Note (Signed)
No significant changes.  No new labs or w/u done today.  She has I.D. Consult for this 11/13/10.

## 2010-11-01 NOTE — Assessment & Plan Note (Signed)
She has history of interstitial cystitis as well as recurrent lower urinary tract infection. We'll send urine for c/s (her most recent one in hosp 10/18/00 showed e. Coli that was pansensitive) and start bactrim ds 1 bid x 5d.  If clx neg will stop abx.  As per her usual, she'll get diflucan 150mg  to use prn yeast infection from antibiotic use.

## 2010-11-01 NOTE — Assessment & Plan Note (Addendum)
Stable.  Discussed pain med use again today.  She understands that she needs to take her breakthrough oxycodone ONLY as needed.  She will not be given additional pain meds for times of additional acute pain such as this.  We reviewed the reasons why, reiterated our general approach to using these meds--as conservative as possible FOR HER OWN SAFETY.  She expressed understanding of this and agreed.  She'll continue with 60mg  qd cymbalta, as well as the soma, amitriptyline, and clonazepam she is on for this condition.

## 2010-11-01 NOTE — Progress Notes (Signed)
Subjective:    Patient ID: Alexandra Reid, female    DOB: Sep 26, 1965, 45 y.o.   MRN: [id number]  HPI 45 y/o WF here for acute right sided chest wall pain and ongoing lower UTI sx's. On 10/26/10 after going to bed she got up to urinate b/c of extreme urinary frequency, slipped on bathroom rug and slammed her right chest wall into the bath tub.  The pain was severe initially but has subsided a bit gradually over the last 6d.  No SOB.  No significant episode of SS CP or pressure since hospital d/c about 2 wks ago.  She has been on the amlodipine 5mg  qd for a week, thinks it might be causing mild HA's but not sure if increased dose of cymbalta could be causing the HA's, too. Still having urinary frequency, urgency, and mild burning with urination-no change on 3d of cipro recently.  No recent fevers, no flank pain.  Regarding fibromyalgia, her pain is unchanged: still 8/10, brought down to 4/10 with the current med regiment.  Recent urine drug screen pos only for opiates, as expected. She continues to use alternating heat/cold and lidocaine patches prn. She able to do light housework for 20-30 minute stretches.    Last episode of fever, intense body pain, nausea, and extreme fatigue was 5d/a---she has had them q10d now twice in a row, prior to that they were closer to q7d.   Review of Systems  Constitutional: Positive for fever and fatigue.  HENT: Negative for congestion and sore throat.   Eyes: Negative for pain and visual disturbance.  Respiratory: Negative for cough, chest tightness and wheezing.   Cardiovascular: Negative for chest pain.  Gastrointestinal: Positive for nausea and constipation. Negative for vomiting, abdominal pain, diarrhea, blood in stool and rectal pain.  Genitourinary: Positive for dysuria, urgency and difficulty urinating.  Musculoskeletal: Negative for joint swelling.  Skin: Negative for rash.  Neurological: Negative for tremors, syncope, weakness and numbness.    Hematological: Negative for adenopathy.        Objective:   Physical Exam VS: noted, all normal. Gen: Alert, well appearing, oriented x 4. Lungs: CTA bilat, equal BS bilat, nonlabored resps.CV: RRR, no m/r/g.   ABD: soft, NT EXT: no c/c/e Chest wall examined with CMA Stephanie in room as chaperone: right lower chest wall ecchymoses noted, about the size of a softball.  No palpable deformity.  +TTP over bruised area.    Assessment & Plan:  ACUTE CYSTITIS She has history of interstitial cystitis as well as recurrent lower urinary tract infection. We'll send urine for c/s (her most recent one in hosp 10/18/00 showed e. Coli that was pansensitive) and start bactrim ds 1 bid x 5d.  If clx neg will stop abx.  As per her usual, she'll get diflucan 150mg  to use prn yeast infection from antibiotic use.  Contusion of chest wall Feeling a bit better already.  No x-ray needed at this time.  Continue heat/ice prn.  FIBROMYALGIA, SEVERE Stable.  Discussed pain med use again today.  She understands that she needs to take her breakthrough oxycodone ONLY as needed.  She will not be given additional pain meds for times of additional acute pain such as this.  We reviewed the reasons why, reiterated our general approach to using these meds--as conservative as possible FOR HER OWN SAFETY.  She expressed understanding of this and agreed.  She'll continue with 60mg  qd cymbalta, as well as the soma, amitriptyline, and clonazepam she is on for  this condition.  FEVER, RECURRENT No significant changes.  No new labs or w/u done today.  She has I.D. Consult for this 11/13/10.

## 2010-11-02 NOTE — Progress Notes (Signed)
Summary: Call from Infectious Disease  Phone Note From Other Clinic   Caller: Nurse Request: Talk with Nurse Summary of Call: Call from the infectious disease at [facility] for this patient . She spoke with patient she will not be able to come on monday March 19,2011 patient told her she was sick .The next availble appt is April 9th. Initial call taken by: Georga Bora,  October 20, 2010 2:50 PM  Follow-up for Phone Call        Okay, please secure that spot/first available. Follow-up by: Michell Heinrich M.D.,  October 21, 2010 8:16 AM  Additional Follow-up for Phone Call Additional follow up Details #1::        Patient appt was made for April 9,2012. Additional Follow-up by: Georga Bora,  October 26, 2010 11:18 AM

## 2010-11-02 NOTE — Progress Notes (Signed)
This encounter was created in error - please disregard.

## 2010-11-03 ENCOUNTER — Telehealth: Payer: Self-pay | Admitting: Family Medicine

## 2010-11-03 NOTE — Telephone Encounter (Signed)
A user error has taken place: encounter opened in error, closed for administrative reasons.

## 2010-11-04 LAB — URINE CULTURE

## 2010-11-05 DIAGNOSIS — E042 Nontoxic multinodular goiter: Secondary | ICD-10-CM

## 2010-11-05 HISTORY — PX: BIOPSY THYROID: PRO38

## 2010-11-05 HISTORY — DX: Nontoxic multinodular goiter: E04.2

## 2010-11-06 ENCOUNTER — Telehealth: Payer: Self-pay | Admitting: *Deleted

## 2010-11-06 MED ORDER — NITROFURANTOIN MONOHYD MACRO 100 MG PO CAPS: 100.0000 mg | ORAL_CAPSULE | Freq: Two times a day (BID) | ORAL | Status: DC

## 2010-11-06 NOTE — Telephone Encounter (Signed)
Message copied by Francee Piccolo on Mon Nov 06, 2010 11:52 AM ------      Message from: Nicoletta Ba      Created: Mon Nov 06, 2010  8:36 AM       Pls call and ask if she feels back to her baseline as far as her urinary complaints go.  Hopefully she'll give you a straight answer.  Her urine culture grew small amounts of two kinds of bacteria, but my treatment decision depends somewhat on how symptomatic she is.  Let me know please.  Thx.

## 2010-11-06 NOTE — Telephone Encounter (Signed)
I have attempted to contact this patient by phone with the following results: left message to return my call on answering machine.

## 2010-11-06 NOTE — Progress Notes (Signed)
I spoke to [facility], she states she still feels like she is having a UTI.  She is still having burning with urination.  She feels like she has to urinate all the time, but only a small amount comes out. Pt is not allergic to any antibiotics.  Call to [facility]. Pt also [facility], as [facility] would not fill for her last week.  Advised pt we have called in Pyridium and if she would prefer [facility] to be at [facility] would need [facility].  Pt states she will take care of.

## 2010-11-06 NOTE — Telephone Encounter (Signed)
I eRx'd macrobid for 10 days to [facility].   If still having symptoms at the end of this course of antibiotics, we need to get another urine specimen for evaluation.  Thx.

## 2010-11-06 NOTE — Telephone Encounter (Signed)
I spoke to Alexandra Reid, she states she still feels like she is having a UTI.  She is still having burning with urination.  She feels like she has to urinate all the time, but only a small amount comes out. Pt is not allergic to any antibiotics.  Call to [facility]. Pt also requested Pyridium [facility], as [facility] would not fill for her last week.  Advised pt we have called in Pyridium and if she would prefer [facility] be at [facility] would need to [facility].  Pt states she will take care of. 

## 2010-11-06 NOTE — Telephone Encounter (Signed)
Notified pt of above.  She is agreeable and voices understanding.

## 2010-11-08 ENCOUNTER — Other Ambulatory Visit: Payer: Self-pay | Admitting: Family Medicine

## 2010-11-08 LAB — POCT RAPID STREP A (OFFICE): Streptococcus, Group A Screen (Direct): NEGATIVE

## 2010-11-08 NOTE — H&P (Signed)
NAME:  Alexandra Reid, Alexandra Reid               ACCOUNT NO.:  [id number]  MEDICAL RECORD NO.:  [id number]           PATIENT TYPE:  I  LOCATION:  2911                         FACILITY:  [facility]  PHYSICIAN:  Jesse Sans. Awad Gladd, MD, FACCDATE OF BIRTH:  1966/05/06  DATE OF ADMISSION:  10/19/2010 DATE OF DISCHARGE:                             HISTORY & PHYSICAL   PRIMARY CARE PHYSICIAN:  Jeoffrey Massed, MD  PRIMARY CARDIOLOGIST:  None.  CHIEF COMPLAINT:  Chest pain.  HISTORY OF PRESENT ILLNESS:  Alexandra Reid is a 45 year old female with no previous history of coronary artery disease.  She has had an intermittent illness with nausea, vomiting and temperatures up to 104 degrees Fahrenheit since October 2011.  She has been extensively evaluated for this.  She had these symptoms on October 16, 2010.  She had fever for approximately 24 hours and vomiting for about 36 hours.  She has continued to be nauseated with decreased p.o. intake since then. She has also had general malaise.  Today, she woke at approximately 9:30 with crushing chest pain, a 7 or 8/10.  It lasted less than 10 minutes. She was able to sleep a little more and woke up again with the same left- sided chest pain, but this time it radiated into her left arm and left jaw.  She was already nauseated and the nausea did not change.  There was no shortness of breath, vomiting or diaphoresis.  Once again, the symptoms lasted less than 10 minutes.  She called a family member and called her primary care physician.  She went to the [facility] Emergency Room.  There she was given aspirin 81 mg x4 as well as a GI cocktail, a sublingual nitroglycerin, IV nitroglycerin and heparin.  Her symptoms greatly improved with the nitroglycerin under her tongue.  Dr. Dietrich Pates was contacted who recommended transfer to [facility].  En route she felt that the chest pain was coming back and the nitroglycerin drip was increased.  She was also given morphine 2 mg.  At  this time, she still complains of a very low level of chest pain, but appears to be resting comfortably.  She has never had symptoms like this before.  While doing things such as cleaning house recently, she has felt more dyspnea on exertion then was usual and has also noted increased diaphoresis.  She has not had those symptoms today.  PAST MEDICAL HISTORY: 1. History of palpitations with a stress test several years ago that     was reportedly okay, no further details available. 2. Intermittent illness with temperatures greater than or equal to 102     degrees Fahrenheit, nausea, vomiting and headache.  The symptoms     last approximately 36 hours with general malaise lasting for the     next several days. 3. Gastroesophageal reflux disease. 4. History of gastritis. 5. History of cholecystitis with cholelithiasis, status post ERCP     sphincterotomy and cholecystectomy.  SURGICAL HISTORY:  She is status post EGD with biopsy as well as ERCP with sphincterotomy, cholecystectomy, total abdominal hysterectomy and foot surgery.  ALLERGIES:  No known  drug allergies.  MEDICATIONS:  The list is incomplete, but includes; 1. Ambien. 2. Amitriptyline 3. Nexium. 4. Cymbalta. 5. Albuterol. 6. Oxycodone. 7. Soma 8. Calcium. 9. Fish oil.  SOCIAL HISTORY:  She lives in [place] with her husband.  She is on leave from work because of some physical issues.  She denies any history of alcohol, tobacco or drug abuse.  She does not exercise, but works around the house.  FAMILY HISTORY:  Her mother died at 52 from pneumonia and Pick's disease.  Her father is alive at 75 with a history of arrhythmia, but no coronary artery disease and no siblings have coronary artery disease.  REVIEW OF SYSTEMS:  Every week or two she will get a temperature as high as 104.  She has not been febrile since October 16, 2010.  The chest pain is described above.  She has not had presyncope or syncope.  She has chronic  arthralgias and multiple joint and back pain.  The nausea and vomiting is not associated with hematemesis or diarrhea.  She has regular significant reflux symptoms.  Full 14-point review of systems is otherwise negative except as stated in the HPI.  PHYSICAL EXAMINATION:  VITAL SIGNS:  Temperature is 97.6, blood pressure 111/91, pulse 104, respiratory rate 14, O2 saturation 100% on 2 L. GENERAL:  She is a well-developed, well-nourished white female, in no acute distress at rest. HEENT:  Normal. NECK:  There is no lymphadenopathy, thyromegaly, bruit or JVD noted. CVA:  Her heart is regular in rate and rhythm with an S1-S2 and no significant murmur, rub or gallop is noted.  Distal pulses are intact in all four extremities. LUNGS:  Clear to auscultation bilaterally. SKIN:  No rashes or lesions are noted. ABDOMEN:  Soft and nontender with active bowel sounds. EXTREMITIES:  There is no cyanosis, clubbing or edema noted. MUSCULOSKELETAL:  There is no joint deformity or effusions and no spine or CVA tenderness. NEURO:  She is alert and oriented.  Cranial nerves II through XII grossly intact.  CHEST X-RAY:  No acute disease.  EKG:  Sinus tach rate 122 with no acute ischemic changes.  At rest and supine in bed, her heart rate was 107.  When she got up to the bedside commode, she remained in sinus rhythm, but was sinus tach rate 133.  LABORATORY VALUES:  Hemoglobin 12.2, hematocrit 35.1, WBCs 10.0, platelets 252.  Sodium 126, potassium 3.7, chloride 93, CO2 23, BUN 5, creatinine 0.79, glucose 127.  Point-of-care myoglobin 52.9 then 47.5 with MB less than 1.0 x2 and troponin I 0.37 then 0.33.  D-dimer 0.22.  IMPRESSION:  Alexandra Reid has been seen by Dr. Daleen Squibb, the patient evaluated and the data reviewed.  She is a 45 year old female with no previous history of coronary artery disease who had 2 episodes of crushing chest pain. 1. Chest pain.  Her symptoms are concerning for unstable angina.   Her     EKG is not acute, but her troponin's are abnormal.  We will     continue to cycle cardiac enzymes and continue her on aspirin,     nitrates and heparin.  She will be n.p.o. after midnight for     cardiac catheterization in a.m. 2. Fever/nausea and vomiting:  The symptoms are recurrent and     evaluation has been extensive so far and is continuing with     upcoming consult by Infectious Disease and Rheumatology.  She will     be treated symptomatically. 3.  Hyponatremia:  We will hydrate her with normal saline as she is     possibly orthostatic.  Her sodium will be followed carefully and we     will check her magnesium level as well. 4. Unknown lipid status:  The patient gets a physical every year, but     is not aware of any hyperlipidemia.  We will check a lipid profile. 5. The patient is otherwise stable, and will be continued on all of her     home medications.     Theodore Demark, PA-C   ______________________________ Jesse Sans Daleen Squibb, MD, [facility] [place]    RB/MEDQ  D:  10/19/2010  T:  10/20/2010  Job:  [id number]  Electronically Signed by Theodore Demark PA-C on 10/24/2010 04:41:27 PM Electronically Signed by Valera Castle MD [facility] on 11/08/2010 01:01:23 PM

## 2010-11-09 ENCOUNTER — Emergency Department (HOSPITAL_COMMUNITY)
Admission: EM | Admit: 2010-11-09 | Discharge: 2010-11-09 | Disposition: A | Discharge status: Home / Self Care | Payer: 59 | Attending: Emergency Medicine | Admitting: Emergency Medicine

## 2010-11-09 ENCOUNTER — Encounter (HOSPITAL_COMMUNITY): Payer: Self-pay | Admitting: Radiology

## 2010-11-09 ENCOUNTER — Emergency Department (HOSPITAL_COMMUNITY): Payer: 59

## 2010-11-09 DIAGNOSIS — K219 Gastro-esophageal reflux disease without esophagitis: Secondary | ICD-10-CM | POA: Insufficient documentation

## 2010-11-09 DIAGNOSIS — G2581 Restless legs syndrome: Secondary | ICD-10-CM | POA: Insufficient documentation

## 2010-11-09 DIAGNOSIS — J45909 Unspecified asthma, uncomplicated: Secondary | ICD-10-CM | POA: Insufficient documentation

## 2010-11-09 DIAGNOSIS — G43909 Migraine, unspecified, not intractable, without status migrainosus: Secondary | ICD-10-CM | POA: Insufficient documentation

## 2010-11-09 DIAGNOSIS — IMO0001 Reserved for inherently not codable concepts without codable children: Secondary | ICD-10-CM | POA: Insufficient documentation

## 2010-11-09 DIAGNOSIS — M1990 Unspecified osteoarthritis, unspecified site: Secondary | ICD-10-CM | POA: Insufficient documentation

## 2010-11-09 DIAGNOSIS — E042 Nontoxic multinodular goiter: Secondary | ICD-10-CM | POA: Insufficient documentation

## 2010-11-09 DIAGNOSIS — R079 Chest pain, unspecified: Secondary | ICD-10-CM | POA: Insufficient documentation

## 2010-11-09 DIAGNOSIS — Z8249 Family history of ischemic heart disease and other diseases of the circulatory system: Secondary | ICD-10-CM | POA: Insufficient documentation

## 2010-11-09 LAB — DIFFERENTIAL
Basophils Absolute: 0 10*3/uL (ref 0.0–0.1)
Eosinophils Absolute: 0.1 10*3/uL (ref 0.0–0.7)
Eosinophils Relative: 2 % (ref 0–5)
Monocytes Absolute: 0.4 10*3/uL (ref 0.1–1.0)

## 2010-11-09 LAB — BASIC METABOLIC PANEL
CO2: 27 mEq/L (ref 19–32)
Calcium: 9.3 mg/dL (ref 8.4–10.5)
Creatinine, Ser: 0.67 mg/dL (ref 0.4–1.2)
GFR calc Af Amer: >60 mL/min (ref >60)
Glucose, Bld: 88 mg/dL (ref 70–99)

## 2010-11-09 LAB — CBC
HCT: 35.2 % — ABNORMAL LOW (ref 36.0–46.0)
Hemoglobin: 11.6 g/dL — ABNORMAL LOW (ref 12.0–15.0)
MCH: 28.4 pg (ref 26.0–34.0)
MCHC: 33 g/dL (ref 30.0–36.0)

## 2010-11-09 LAB — POCT CARDIAC MARKERS
CKMB, poc: <1 ng/mL — ABNORMAL LOW (ref 1.0–8.0)
Troponin i, poc: <0.05 ng/mL (ref 0.00–0.09)

## 2010-11-09 LAB — TSH: TSH: 1.51 u[IU]/mL (ref 0.350–4.500)

## 2010-11-09 MED ORDER — IOHEXOL 350 MG/ML SOLN
100.0000 mL | Freq: Once | INTRAVENOUS | Status: AC | PRN
  Administered 2010-11-09: 100 mL via INTRAVENOUS

## 2010-11-11 ENCOUNTER — Emergency Department (HOSPITAL_COMMUNITY)
Admission: EM | Admit: 2010-11-11 | Discharge: 2010-11-11 | Disposition: A | Discharge status: Home / Self Care | Payer: 59 | Attending: Emergency Medicine | Admitting: Emergency Medicine

## 2010-11-11 DIAGNOSIS — J189 Pneumonia, unspecified organism: Secondary | ICD-10-CM | POA: Insufficient documentation

## 2010-11-11 DIAGNOSIS — T50995A Adverse effect of other drugs, medicaments and biological substances, initial encounter: Secondary | ICD-10-CM | POA: Insufficient documentation

## 2010-11-11 DIAGNOSIS — N390 Urinary tract infection, site not specified: Secondary | ICD-10-CM | POA: Insufficient documentation

## 2010-11-11 LAB — URINALYSIS, ROUTINE W REFLEX MICROSCOPIC
Bilirubin Urine: NEGATIVE
Glucose, UA: NEGATIVE mg/dL
Ketones, ur: NEGATIVE mg/dL
pH: 7 (ref 5.0–8.0)

## 2010-11-11 LAB — URINE MICROSCOPIC-ADD ON

## 2010-11-12 LAB — URINE CULTURE
Colony Count: 5000
Culture  Setup Time: 201204072106

## 2010-11-13 ENCOUNTER — Ambulatory Visit: Payer: Self-pay | Admitting: Infectious Diseases

## 2010-11-14 ENCOUNTER — Other Ambulatory Visit (HOSPITAL_COMMUNITY): Payer: Self-pay | Admitting: Emergency Medicine

## 2010-11-14 ENCOUNTER — Ambulatory Visit (HOSPITAL_COMMUNITY)
Admit: 2010-11-14 | Discharge: 2010-11-14 | Disposition: A | Discharge status: Home / Self Care | Payer: 59 | Source: Ambulatory Visit | Attending: Emergency Medicine | Admitting: Emergency Medicine

## 2010-11-14 ENCOUNTER — Ambulatory Visit (HOSPITAL_COMMUNITY): Admission: RE | Admit: 2010-11-14 | Payer: 59 | Source: Ambulatory Visit

## 2010-11-14 ENCOUNTER — Other Ambulatory Visit: Payer: Self-pay | Admitting: Diagnostic Radiology

## 2010-11-14 ENCOUNTER — Ambulatory Visit (HOSPITAL_COMMUNITY)
Admission: RE | Admit: 2010-11-14 | Discharge: 2010-11-14 | Disposition: A | Discharge status: Home / Self Care | Payer: 59 | Source: Ambulatory Visit | Attending: Emergency Medicine | Admitting: Emergency Medicine

## 2010-11-14 ENCOUNTER — Ambulatory Visit (HOSPITAL_COMMUNITY)
Admit: 2010-11-14 | Discharge: 2010-11-14 | Disposition: A | Discharge status: Home / Self Care | Payer: 59 | Attending: Emergency Medicine | Admitting: Emergency Medicine

## 2010-11-14 DIAGNOSIS — E042 Nontoxic multinodular goiter: Secondary | ICD-10-CM

## 2010-11-14 DIAGNOSIS — E049 Nontoxic goiter, unspecified: Secondary | ICD-10-CM | POA: Insufficient documentation

## 2010-11-15 LAB — POCT URINALYSIS DIP (DEVICE)
Nitrite: NEGATIVE
Urobilinogen, UA: 0.2 mg/dL (ref 0.0–1.0)
pH: 7 (ref 5.0–8.0)

## 2010-11-15 LAB — URINE CULTURE: Colony Count: >=100000

## 2010-11-16 ENCOUNTER — Telehealth: Payer: Self-pay | Admitting: *Deleted

## 2010-11-16 ENCOUNTER — Ambulatory Visit (INDEPENDENT_AMBULATORY_CARE_PROVIDER_SITE_OTHER): Payer: 59 | Admitting: Infectious Diseases

## 2010-11-16 ENCOUNTER — Other Ambulatory Visit: Payer: Self-pay | Admitting: Infectious Diseases

## 2010-11-16 ENCOUNTER — Encounter: Payer: Self-pay | Admitting: Infectious Diseases

## 2010-11-16 VITALS — BP 133/85 | HR 105 | Temp 97.8°F | Ht 64.5 in | Wt 178.4 lb

## 2010-11-16 DIAGNOSIS — N390 Urinary tract infection, site not specified: Secondary | ICD-10-CM

## 2010-11-16 DIAGNOSIS — A689 Relapsing fever, unspecified: Secondary | ICD-10-CM

## 2010-11-16 MED ORDER — PROMETHAZINE HCL 25 MG PO TABS: ORAL_TABLET | ORAL | Status: DC

## 2010-11-16 NOTE — Assessment & Plan Note (Signed)
The etiology of her syndrome is perplexing. There are few unturned stones. Will speak with Path regarding possible dx of hashimoto's (she is concerned as her sister has this). Will check her for viral etiologies, fungal etiologies. Some concern that she may have uncontrolled reflux (which would give her fever and her chest pains) or chronic cystitis. If her w/u is negative may need to persue these as well as consider work up of her lung nodules. I advised her that i saw no acute reason which would preclude her from travel. She will rtc in 2 weeks

## 2010-11-16 NOTE — Telephone Encounter (Signed)
Pt calls today with several issues.   1--she would like refill on phenergan.  Last filled on 2/24 #90, RFx1.  She states she has a few left, but she is going out of town and does not want to run out.  Advised we would send refill.  Pt would like 90-day supply. 2--she is planning to go out of town this weekend to [place] and would like to know if we are OK with her taking this trip.  Verbal OK given by Dr. Milinda Cave and pt informed. 3--pt went to ID clinic this AM.  She was given thyroid biopsy results at this visit.  It was negative for cancer and they are now exploring the possibility of Hashimoto's.  Dr. Ninetta Lights also drew more labs, is going to do more urine tests and has ordered fungal studies on biopsy.  Please advise on quantity/refills for phenergan.

## 2010-11-16 NOTE — Progress Notes (Signed)
  Subjective:    Patient ID: Alexandra Reid, female    DOB: 1965/12/19, 45 y.o.   MRN: [id number]  HPI 45 yo F with hx of fevers beginning October 2011. Fevers/nausea/vomiting and body aches. These have been cyclical ,occuring every 7-10 days. They lat 24 hours and then resolve except for feeling poorly for next 3 days. She has had contrast CT of abd/pelvis which showed mild billiary dilitation and lung nodules (08-22-10) , MRI (08-05-11) LS spine showed DDD. She had negative ANA, Lyme, parvo and EBV. BCx also negative. She has been evaluated by Rheum (has a hx of osteoarthritits). Prev TSH normal  Her ESR and CK have returned to NL after an initial elevation. Has night sweats. Had 2 episodes of chest pain 1 month ago. Had cath that she reports as negative. She had pneumonia last week. She had a Ct chest 11-09-10   1. No evidence of acute pulmonary embolus. 2.  Pulmonary atelectasis with superimposed left upper lobe patchy opacity suspicious for early bronchopneumonia. 3.  Multinodular and enlarged thyroid.  Nonemergent follow-up  thyroid ultrasound and ultrasound guided FNA recommended in light of nonspecific enlarged superior mediastinal prevascular lymph  node.  4.  Cardiomegaly. She completed a z-pack 11-13-10. Had thyroid bx done 11-14-10. Her course has also been complicated by an allergic reaction which resulted in her becoming swollen in her face.  She has been on for a UTI which she states she has had for several months- she has taken keflex, macrobid and recently changed to cipro.  Last temp was 4-3- went up to 101.    Review of Systems  Constitutional: Positive for fever and unexpected weight change.  Respiratory: Negative for cough and shortness of breath.   Musculoskeletal: Positive for myalgias.  Hematological: Negative for adenopathy.  wt down 30#. SOB and "wheezy" has improved with daily inhaler. Has persistent mid-chest pain which feels like smothering. Pain in hips, occas in knees and  hands. No hx of TB. Has sister with Hashimoto's.      Objective:   Physical Exam  Constitutional: She appears well-developed and well-nourished.  Eyes: EOM are normal. Pupils are equal, round, and reactive to light.  Neck: No thyromegaly present.    Cardiovascular: Normal rate, regular rhythm and normal heart sounds.   Pulmonary/Chest: Effort normal and breath sounds normal.  Abdominal: Soft. Bowel sounds are normal. She exhibits no distension. There is no tenderness.  Lymphadenopathy:    She has no cervical adenopathy.    She has no axillary adenopathy.       Right: No supraclavicular adenopathy present.  Psychiatric:       Becomes tearful when discussing possible travel and her illnss.           Assessment & Plan:

## 2010-11-16 NOTE — Telephone Encounter (Signed)
I did her phenergan rx as she requested.

## 2010-11-16 NOTE — Telephone Encounter (Signed)
Addendum created in error.

## 2010-11-17 LAB — TOXOPLASMA GONDII ANTIBODY, IGG: Toxoplasma IgG Ratio: 0.7 IU/mL (ref <6.4)

## 2010-11-17 LAB — CMV IGM: CMV IgM: 0.15 (ref <0.90)

## 2010-11-18 LAB — HIV-1 RNA QUANT-NO REFLEX-BLD: HIV-1 RNA Quant, Log: <1.3 {Log} (ref <1.30)

## 2010-11-21 LAB — QUANTIFERON TB GOLD ASSAY (BLOOD): Interferon Gamma Release Assay: NEGATIVE

## 2010-11-23 ENCOUNTER — Encounter: Payer: Self-pay | Admitting: Family Medicine

## 2010-11-27 ENCOUNTER — Ambulatory Visit (INDEPENDENT_AMBULATORY_CARE_PROVIDER_SITE_OTHER): Payer: 59 | Admitting: Family Medicine

## 2010-11-27 ENCOUNTER — Encounter: Payer: Self-pay | Admitting: Family Medicine

## 2010-11-27 DIAGNOSIS — M797 Fibromyalgia: Secondary | ICD-10-CM

## 2010-11-27 DIAGNOSIS — E042 Nontoxic multinodular goiter: Secondary | ICD-10-CM

## 2010-11-27 DIAGNOSIS — F411 Generalized anxiety disorder: Secondary | ICD-10-CM

## 2010-11-27 DIAGNOSIS — A689 Relapsing fever, unspecified: Secondary | ICD-10-CM

## 2010-11-27 DIAGNOSIS — IMO0001 Reserved for inherently not codable concepts without codable children: Secondary | ICD-10-CM

## 2010-11-27 DIAGNOSIS — F419 Anxiety disorder, unspecified: Secondary | ICD-10-CM

## 2010-11-27 DIAGNOSIS — F329 Major depressive disorder, single episode, unspecified: Secondary | ICD-10-CM

## 2010-11-27 DIAGNOSIS — R300 Dysuria: Secondary | ICD-10-CM

## 2010-11-27 LAB — POCT URINALYSIS DIPSTICK
Glucose, UA: NEGATIVE
Spec Grav, UA: 1.05
Urobilinogen, UA: 0.2

## 2010-11-27 MED ORDER — DULOXETINE HCL 60 MG PO CPEP: 60.0000 mg | ORAL_CAPSULE | Freq: Every day | ORAL | Status: DC

## 2010-11-27 MED ORDER — OXYCODONE HCL 60 MG PO TB12: 1.0000 | ORAL_TABLET | Freq: Two times a day (BID) | ORAL | Status: DC

## 2010-11-27 MED ORDER — CLONAZEPAM 1 MG PO TABS: 1.0000 mg | ORAL_TABLET | ORAL | Status: DC

## 2010-11-27 MED ORDER — OXYCODONE HCL 5 MG PO TABS: ORAL_TABLET | ORAL | Status: DC

## 2010-11-27 MED ORDER — PROMETHAZINE HCL 25 MG PO TABS: ORAL_TABLET | ORAL | Status: DC

## 2010-11-27 NOTE — Assessment & Plan Note (Signed)
UA normal except trace LEU today, so we'll send this for culture--no abx for now. She has history of recurrent INFECTIOUS cystitis as well as documented history of interstitial cystitis. If clx neg and symptoms continue, will have her go back to her urologist for further e/m of this.

## 2010-11-27 NOTE — Assessment & Plan Note (Signed)
FNA biopsy 11/2010 : NONMALIGNANT multinodular goiter. TSH's normal. Discussed this dx today, decided on no treatment at this time since TSH normal and she's asymptomatic. Will follow this with q30mo u/s and TSH for now, adjust if symptoms develop.

## 2010-11-27 NOTE — Assessment & Plan Note (Signed)
She is depressed and has agreed to referral to psychiatrist.   Continue cymbalta 60mg  qd for now.

## 2010-11-27 NOTE — Assessment & Plan Note (Signed)
Problem stable.  Continue current medications and diet appropriate for this condition.  We have reviewed our general long term plan for this problem and also reviewed symptoms and signs that should prompt the patient to call or return to the office. Will initiate psychiatrist referral today.

## 2010-11-27 NOTE — Progress Notes (Signed)
OFFICE VISIT  11/27/2010   CC:  Chief Complaint  Patient presents with  . Follow-up    follow up     HPI:    Patient is a 45 y.o. Caucasian female who presents for f/u of chronic pain syndrome due to fibromyalgia, plus relapsing fever syndrome. Also had multinodular goiter detected recently, biopsy benign.   Most recent episode of febrile syndrome had lower temp per patient. She did not go on her planned trip to [place] recently b/c of feeling bad with an episode. Still having 8/10 pain on awakening: shoulders, upper back, low back, hips, thighs.  This goes down to 4/10 or so when she is on her pain meds, plus she regularly applies ice and heat to various areas of her body.  She did also twist (inverted) her right ankle recently, says it is gradually feeling better. She once again admits to significant anxiety and depression problems, says nothing we are doing really helps this.  This has been ongoing, even before her chronic pain problems but definitely worse coincident with her pain.  She laments about not being believed about her pain, losing friends b/c of her pain issues and emotional problems lately.  She asks me again today if I believe her and says it's hard to have something that no one has any evidence/diagnostic test for.  She has resisted psychiatric referral in the past b/c of "not wanting to deal with some issues" but I confronted her today and emphasized the need for her to do this to be able to give herself the best chance of recovering, so she agreed to referral now.  Past Medical History  Diagnosis Date  . DDD (degenerative disc disease), lumbar   . Headache   . Anxiety   . Fibromyalgia     Dr. Hope Pigeon [phone]  . Interstitial cystitis   . Obesity   . Insomnia   . Transaminasemia 03/2010    (ALT 49), normal on f/u testing off of daily tylenol uuse.  Marland Kitchen GERD (gastroesophageal reflux disease) 06/01/10    Dyspepsia/gastritis, EGD: gastritis (NSAIDS)  . Rectal  bleeding 06/01/10    Anorectal bleeding/BRBPR--colonoscopy: internal hemorrhoids  . Recurrent UTI     renal u/s normal 07/2007  . Weight loss     CT abd/pelf 08/22/10 remakable only for bile duct 9mm (unchanged from 03/2004 PRIOR to ERCP w/spincterotomy, bile duct balloon redging and lap chole)  . Recurrent fever     CT abd/pelf 08/22/10 remakable only for bile duct 9mm (unchanged from 03/2004 PRIOR to ERCP w/spincterotomy, bile duct balloon redging and lap chole)  . Asthma   . Osteopenia     bone densitometry 2006 per pt  . 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 Christella Hartigan  . Multinodular goiter 11/2010    TSH normal    Past Surgical History  Procedure Date  . Bunionectomy     right foot  . Abdominal hysterectomy   . Bilateral salpingoophorectomy 2003    endometriosis  . Cholecystectomy 2005    lap--(cholelithiasis w/choledocholithiasis on u/s 2005--PreOperative ERCP showed NO stone or other obstruction in the biliary tract, but CBD messured 9mm near head of pancreas.  S sphincterotomy and bile duct balloon dredging was done at that time.  MRCP 10/2010 NORMAL.  Marland Kitchen Orif metatarsal fracture 2006    Right 2nd and 3rd metatarsals (Dr. Romeo Apple)  . Biopsy thyroid 11/2010    Fine needle: NONmalignant goiter    Outpatient Prescriptions Prior to Visit  Medication Sig Dispense Refill  . albuterol (PROAIR HFA) 108 (90 BASE) MCG/ACT inhaler Inhale 2 puffs into the lungs every 4 (four) hours as needed.        Marland Kitchen amitriptyline (ELAVIL) 100 MG tablet Take 100 mg by mouth at bedtime.        Marland Kitchen amLODipine (NORVASC) 5 MG tablet Take 1 tablet (5 mg total) by mouth daily.  90 tablet  3  . Ascorbic Acid (VITAMIN C) 500 MG tablet Take 500 mg by mouth daily.        . Calcium Carbonate (CALCIUM 500 PO) Take 1 tablet by mouth daily.        . carisoprodol (SOMA) 350 MG tablet Take 350 mg by mouth 3 (three) times daily as needed.        Marland Kitchen esomeprazole (NEXIUM) 40 MG capsule Take 40 mg  by mouth daily before breakfast.        . Fish Oil OIL Take 2 capsules by mouth daily.        . fluconazole (DIFLUCAN) 150 MG tablet 1 tab once daily x 1d, may repeat in 3d if still symptomatic  2 tablet  2  . Fluticasone-Salmeterol (ADVAIR DISKUS) 250-50 MCG/DOSE AEPB Inhale 1 puff into the lungs every 12 (twelve) hours.        . ondansetron (ZOFRAN-ODT) 8 MG disintegrating tablet Take 8 mg by mouth every 8 (eight) hours as needed. For nausea       . phenazopyridine (PYRIDIUM) 200 MG tablet Take 1 tablet (200 mg total) by mouth 3 (three) times daily as needed for pain.  20 tablet  6  . zolpidem (AMBIEN) 10 MG tablet Take 10 mg by mouth at bedtime as needed.        . clonazePAM (KLONOPIN) 1 MG tablet Take 1 tab po q8h       . oxyCODONE (OXY IR/ROXICODONE) 5 MG immediate release tablet Take 1-2 tablets by mouth every 4 hours as needed for breakthrough pain  300 tablet  0  . Oxycodone HCl (OXYCONTIN) 60 MG TB12 Take 1 tablet (60 mg total) by mouth 2 (two) times daily. Pain contract in chart  60 each  0  . promethazine (PHENERGAN) 25 MG tablet Take 1-2 tablets every 6 hours as needed for nausea  720 tablet  0  . ciprofloxacin (CIPRO) 500 MG tablet Take 500 mg by mouth 2 (two) times daily.         No Known Allergies  ROS As per HPI.  Also, ongoing dysuria/burning in urethral/bladder area even when not urinating (requires AZO daily). Chronic nausea that fluctuates in intensity and requires daily phenergan. Right eye ball with intermittent (on and off over the last 2 yrs or so) redness, sometimes eyelid swelling with this, without eye d/c.  Sometimes the eye is painful when red, other times feels normal.  No itching. No dysphagia, odynophagia, or anterior neck pain or discomfort.   Appetite is okay.   IBS symptoms flaring lately--with tendency towards looser but thick and sticky stool.  She still complains of a large prolapsing (and painful) hemorrhoid that is golf ball size per her description.  She  is EXTREMELY hesitant to let me examine this, so I agreed to put this off until next f/u visit.  PE: Blood pressure 155/101, pulse 108, temperature 97.7 F (36.5 C), temperature source Oral, height 5' 4.5" (1.638 m), weight 184 lb 12.8 oz (83.825 kg), SpO2 96.00%. Gen: Alert, well appearing.  Patient is oriented to person,  place, time, and situation.  Tearful intermittently, alternating with pleasantly smiling,as per her usual demeanor.   Lower legs: 1+ pitting edema in ankles.  Some bruising in right ankle superior to lateral malleolus.   LABS:  CC UA TODAY: negative except trace LEU  IMPRESSION AND PLAN:  FIBROMYALGIA, SEVERE Problem stable.  Continue current medications and diet appropriate for this condition.  We have reviewed our general long term plan for this problem and also reviewed symptoms and signs that should prompt the patient to call or return to the office. Will initiate psychiatrist referral today.  FEVER, RECURRENT The only change is that the height of the temp seems to be getting lower.  Still no etiology known at this time. Recent viral, fungal, atypical bact testing was negative at Dr. Moshe Cipro office. No specific testing was done for this problem today.  We'll repeat some next f/u visit in 45mo.    DEPRESSION She is depressed and has agreed to referral to psychiatrist.   Continue cymbalta 60mg  qd for now.  ANXIETY STATE, UNSPECIFIED She has chronic worry/anxiety.  Panic is not a problem. Continue clonazepam will be continued and she is agreeable to referral to psychiatrist so we'll order this.  Goiter, nontoxic, multinodular FNA biopsy 11/2010 : NONMALIGNANT multinodular goiter. TSH's normal. Discussed this dx today, decided on no treatment at this time since TSH normal and she's asymptomatic. Will follow this with q38mo u/s and TSH for now, adjust if symptoms develop.  Dysuria UA normal except trace LEU today, so we'll send this for culture--no abx for  now. She has history of recurrent INFECTIOUS cystitis as well as documented history of interstitial cystitis. If clx neg and symptoms continue, will have her go back to her urologist for further e/m of this.     FOLLOW UP: Return in about 1 month (around 12/27/2010).

## 2010-11-27 NOTE — Assessment & Plan Note (Signed)
The only change is that the height of the temp seems to be getting lower.  Still no etiology known at this time. Recent viral, fungal, atypical bact testing was negative at Dr. Moshe Cipro office. No specific testing was done for this problem today.  We'll repeat some next f/u visit in 60mo.

## 2010-11-27 NOTE — Assessment & Plan Note (Signed)
She has chronic worry/anxiety.  Panic is not a problem. Continue clonazepam will be continued and she is agreeable to referral to psychiatrist so we'll order this.

## 2010-12-06 ENCOUNTER — Telehealth: Payer: Self-pay | Admitting: *Deleted

## 2010-12-06 ENCOUNTER — Encounter: Payer: Self-pay | Admitting: Infectious Diseases

## 2010-12-06 ENCOUNTER — Ambulatory Visit (INDEPENDENT_AMBULATORY_CARE_PROVIDER_SITE_OTHER): Payer: 59 | Admitting: Infectious Diseases

## 2010-12-06 DIAGNOSIS — E042 Nontoxic multinodular goiter: Secondary | ICD-10-CM

## 2010-12-06 DIAGNOSIS — A689 Relapsing fever, unspecified: Secondary | ICD-10-CM

## 2010-12-06 DIAGNOSIS — R21 Rash and other nonspecific skin eruption: Secondary | ICD-10-CM

## 2010-12-06 NOTE — Assessment & Plan Note (Signed)
She has im[prved and her fevers have resolved. i am not sure what the underlying dx was. Could have been related to her fibromyaligia? GERD? Autoimmune syndrome related to her thyroid. She would like to be tested for hashimoto's so will send anti-peroxidase antibodies for her. She will f/u prn, if she has further flu like syndrome.

## 2010-12-06 NOTE — Assessment & Plan Note (Signed)
This area is fairly unusual for a bruise. i have asked her to be eval by Derm. She will see a derm MD in [place] of her choosing.

## 2010-12-06 NOTE — Telephone Encounter (Addendum)
Pt called and stated she is on her way to ID MD.  She has noticed a 2"x2" area on her leg, that was previously a bruise, but now is a red, raised, blistery area.  She will show this area to her ID MD and will request he do a culture for MRSA.  Advised pt to call us if he does not evaluate this area today to make an appt with Dr. Milinda Cave.

## 2010-12-06 NOTE — Telephone Encounter (Signed)
Noted-PM 

## 2010-12-06 NOTE — Progress Notes (Signed)
  Subjective:    Patient ID: Alexandra Reid, female    DOB: March 04, 1966, 45 y.o.   MRN: [id number]  HPI 45 yo F with hx of fevers beginning October 2011. Fevers/nausea/vomiting and body aches. These have been cyclical ,occuring every 7-10 days. They lat 24 hours and then resolve except for feeling poorly for next 3 days. She has had contrast CT of abd/pelvis which showed mild billiary dilitation and lung nodules (08-22-10) , MRI (08-05-11) LS spine showed DDD. She had negative ANA, Lyme, parvo and EBV. BCx also negative. She has been evaluated by Rheum (has a hx of osteoarthritits). Prev TSH normal  Her ESR and CK have returned to NL after an initial elevation. Has night sweats. Had 2 episodes of chest pain 1 month ago. Had cath that she reports as negative. She had pneumonia last week. She had a Ct chest 11-09-10 1. No evidence of acute pulmonary embolus. 2. Pulmonary atelectasis with superimposed left upper lobe patchy opacity suspicious for early bronchopneumonia. 3. Multinodular and enlarged thyroid. Nonemergent follow-up thyroid ultrasound and ultrasound guided FNA recommended in light of nonspecific enlarged superior mediastinal prevascular lymph node. 4. Cardiomegaly.  She completed a z-pack 11-13-10. Had thyroid bx done 11-14-10. Her course has also been complicated by an allergic reaction which resulted in her becoming swollen in her face.  She has been on for anbx UTI which she states she has had for several months- she has taken keflex, macrobid and recently changed to cipro.  Last temp was 4-3- went up to 101. She was seen in ID clinic 11-16-10 and had multiple serologies sent (HIV/CMV/Toxo/Crypto/Quantiferon Gold) all negative as well as BCx (-).  Recently has noted a strange area on her foot- after inverting her ankle. She denies any new soaps or shampoos. No applied lotions to this area. Questions if this could be MRSA. No more fevers or chills. Believes that she does have low grade .Has been 2 weeks  since she has had "sick episode". Has had some chest pain (1 episode) and swelling in her lower legs. Had some wheezing and SOB, required extra use of inhaler.    Review of Systems     Objective:   Physical Exam  Constitutional: She appears well-developed and well-nourished.  Musculoskeletal:       ~ 2 inch area of erythema overlaying yellowish descoloration and swelling of previous bruise. There is a ~ 1 cm purplish area superior to this. There is no fluctuance. Minimal tenderness. No increase in heat.           Assessment & Plan:

## 2010-12-07 LAB — THYROID PEROXIDASE ANTIBODY: Thyroperoxidase Ab SerPl-aCnc: 13.6 IU/mL (ref <35.0)

## 2010-12-19 NOTE — Op Note (Signed)
NAME:  Alexandra Reid, Alexandra Reid               ACCOUNT NO.:  [id number]   MEDICAL RECORD NO.:  [id number]          PATIENT TYPE:  AMB   LOCATION:  [facility]                         FACILITY:  [facility]   PHYSICIAN:  Ronald L. Earlene Plater, M.D.  DATE OF BIRTH:  16-Dec-1965   DATE OF PROCEDURE:  03/25/2009  DATE OF DISCHARGE:                               OPERATIVE REPORT   DIAGNOSIS:  Possible interstitial cystitis.   OPERATIVE PROCEDURE:  Cystourethroscopy, hydraulic bladder distention,  bladder biopsy.   SURGEON:  Gaynelle Arabian, M.D.   ANESTHESIA:  LMA.   FINDINGS:  1200 mL capacity with 80 cm water pressure.  A few  glomerulations were noted.   ESTIMATED BLOOD LOSS:  Negligible.   TUBES:  None.   COMPLICATIONS:  None.   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  She also notes that  she has had some urgency, frequency and bladder pain relieved by  voiding.  She has occasional dyspareunia.  After understanding risks,  benefits and alternatives, she has elected to proceed with the above  procedure for both diagnostic and therapeutic reasons.   PROCEDURE IN DETAIL:  The patient was placed in supine position.  After  proper LMA anesthesia, was placed in the dorsal lithotomy position and  prepped and draped with Betadine a sterile fashion.  Cystourethroscopy  was performed with a 22.5 [place] Olympus panendoscope.  Utilizing 12 and  70 degree lenses, the bladder was carefully inspected and noted to be  without significant lesions.  There were some mild chronic changes of  inflammation posteriorly but no frank lesions were noted.  Efflux of  clear urine was noted from normally placed orifices bilaterally.  Hydraulic bladder distention was performed at 80 cm water pressure with  sterile water.  She was noted have a 1200 mL capacity bladder and when  it was relieved, there were some diffuse mild submucosal, glomerulations  noted,  nothing concentrated and certainly no bleeding was noted.  A  biopsy was taken at the posterior midline, submitted to pathology and  the base was cauterized with Bugbee coagulation cautery, there were no  other lesions noted.  The bladder was drained.  Specimens submitted to  pathology and the patient was taken to the recovery room stable.     Ronald L. Earlene Plater, M.D.  Electronically Signed    RLD/MEDQ  D:  03/25/2009  T:  03/25/2009  Job:  [id number]

## 2010-12-22 NOTE — Op Note (Signed)
NAME:  Alexandra Reid, Alexandra Reid                         ACCOUNT NO.:  [id number]   MEDICAL RECORD NO.:  [id number]                   PATIENT TYPE:  INP   LOCATION:  A420                                 FACILITY:  [facility]   PHYSICIAN:  Barbaraann Barthel, M.D.              DATE OF BIRTH:  [birth year]   DATE OF PROCEDURE:  03/14/2004  DATE OF DISCHARGE:                                 OPERATIVE REPORT   SURGEON:  Dr. Malvin Johns.   PREOPERATIVE DIAGNOSES:  Cholecystitis secondary to cholelithiasis status  post endoscopic retrograde cholangiopancreatography.   POSTOPERATIVE DIAGNOSES:  Cholecystitis secondary to cholelithiasis status  post endoscopic retrograde cholangiopancreatography.   NOTE:  This is a 45 year old white female who had recurrent right upper  quadrant pain and nausea and sonogram revealed her to have multiple  gallstones as well as a gallstone within the common duct.  She underwent  ERCP prior to surgery.  No stone was found.  A sphincterotomy was performed  and as there was some minimal dilation of the bile ducts and she was  scheduled for laparoscopic cholecystectomy within 24 hours.   We discussed the procedure in detail with the patient, discussing  complications not limited to but including bleeding, infection, damage to  bile ducts, perforation of organs and transitory diarrhea.  Informed consent  was obtained.   GROSS OPERATIVE FINDINGS:  The patient had some fatty infiltration of  Hartman's pouch.  Multiple or rather large stones within the gallbladder and  fibrous adhesions about the lower portion of the gallbladder.   She had a cystic duct clearly visualized which was triply silver clipped and  divided.   TECHNIQUE:  The patient was placed in the supine position after the adequate  administration of general anesthesia via endotracheal intubation.  Her  entire abdomen was prepped with Betadine solution and draped in the usual  manner.  Prior to this, a Foley catheter  was aseptically inserted and a peri-  umbilical incision was carried out over the superior aspect of the  umbilicus.  The fascia was grasped with a sharp towel clip and with the  patient in Trendelenburg, the fascia was elevated and a Veress needle was  inserted and confirmed in position with the saline drop test.  The abdomen  was then insufflated with approximately 3.5 L of CO2.  Using the Visiport  technique, a 12 mm cannula was placed in the umbilicus and three other  cannulas, a 12 mm cannula in the epigastrium and a two 5 mm cannulas in the  right upper quadrant were placed.  The gallbladder was grasped.  Its  adhesions were taken down.  The cystic duct was clearly identified, triply  silver clipped and divided as was the cystic artery.  The gallbladder was  then removed using the hook cautery device.  There was a minimal amount of  oozing however, I did elect to leave a piece of Surgicel within the  gallbladder bed.  After irrigating and checking for hemostasis, the abdomen  was then desufflated after we removed the gallbladder with its stones  through the epigastric incision.  The fascia was closed and the epigastric  incision and then the umbilicus with 0 Polysorb and 0.5% Sensorcaine was  used for all port sites  to help with postoperative comfort.  The wounds were closed with a stapling  device.  Prior to closure, all sponge, needle and instrument counts were  found to be correct.  Estimated blood loss was minimal.  The patient  received 900 cc of crystalloids intraoperatively.  There were no  complications.      ___________________________________________                                            Barbaraann Barthel, M.D.   WB/MEDQ  D:  03/14/2004  T:  03/14/2004  Job:  [id number]   cc:   Jeoffrey Massed, M.D.  [address]  [place]  [place] [address]  Fax: [phone]   R. Roetta Sessions, M.D.  [address]  [place]  [place] [address]  Fax: [phone]

## 2010-12-22 NOTE — H&P (Signed)
[facility]  Patient:    Alexandra Reid, Alexandra Reid Visit Number: [id number] MRN: [id number]          Service Type: Attending:  Duane Lope, M.D. Dictated by:   Duane Lope, M.D. Adm. Date:  11/25/01                           History and Physical  DATE OF BIRTH:  04-05-1966    SS:  #[id number]  HISTORY OF PRESENT ILLNESS:  Alexandra Reid is a 45 year old white female, gravida 4, para 4, with last menstrual period of November 04, 2001.  She came in that day with a negative urine pregnancy test.  She was admitted for an abdominal hysterectomy, a left ovarian cystectomy, and abdominoplasty/panniculectomy. The patient has had increasingly heavy menses for the past several years. They have gotten much heavier, both in volume and in her cramping.  She has tried multiple oral contraceptives in the past, but they cause her to have nausea and vomiting, and really do not help at all either.  She also has positional dyspareunia.  A bimanual examination also reveals it to be tender to palpation.  There is no cervical motion tenderness.  During her evaluation, she was also found to have a 3.5 cm left ovarian cyst, which seems to be a simple cyst, which she has been having some pain in the left side for some time.  She also has sort of a dull chronic ache in her pelvis on and off, but especially with her cycle, but also does not have to be with her cycle.  As a result, because of this problem over the past two years, she is admitted for definitive therapy, which is her request.  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  PAST SURGICAL HISTORY 1. Tonsils and adenoids. 2. Bunionectomy on her right foot.  PAST OBSTETRIC HISTORY:  Four vaginal deliveries.  ALLERGIES:  No known drug allergies.  CURRENT MEDICATIONS 1. Ventolin inhaler p.r.n. 2. Actifed over-the-counter  for her allergies.  REVIEW OF SYSTEMS:  Noncontributory.  FAMILY HISTORY:  Mother had dementia.  All of her brothers and sisters are alive and well.  Heart disease in the maternal grandfather and father. Testicular cancer in her father.  A sister had atypical cells in the thyroid. Her mom had bleeding problems as well, as far as her periods went.  PHYSICAL EXAMINATION  VITAL SIGNS:  Weight 142 pounds, blood pressure 140/70.  Urine hCG was negative.  Urinalysis was negative.  HEENT:  Unremarkable.  NECK:  Thyroid is normal.  LUNGS:  Clear.  HEART:  A regular rate and rhythm with no murmur, regurgitation, or gallop.  BREASTS:  Deferred.  ABDOMEN:  Benign.  No hepatosplenomegaly or masses, nontender.  GENITOURINARY/PELVIC:  She has normal external genitalia, ________ without discharge.  Cervix is parous without lesions.  Her last Pap smear was normal. Uterus is normal in size, shape, and contour.  The examination is as stated above, the left ovary is tender and large.  She does have a confirmed cyst on ultrasound.  The right ovary is normal and nontender.  EXTREMITIES:  There is no edema.  NEUROLOGIC:  Grossly intact.  IMPRESSION 1. Dysmenorrhea. 2. Hypermenorrhea, both of which are worsening. 3. Positional dyspareunia. 4. Left ovarian cyst.  PLAN:  The patient is admitted for a TAH, and left  ovarian cystectomy, as well as an abdominoplasty/panniculectomy.  She understands the risks, benefits, indications, and alternatives, and will proceed. Dictated by:   Duane Lope, M.D. Attending:  Duane Lope, M.D. DD:  11/25/01 TD:  11/25/01 Job: [id number]

## 2010-12-22 NOTE — Consult Note (Signed)
NAME:  Alexandra Reid, Alexandra Reid               ACCOUNT NO.:  [id number]   MEDICAL RECORD NO.:  [id number]          PATIENT TYPE:  EMS   LOCATION:  ED                            FACILITY:  [facility]   PHYSICIAN:  Tilda Burrow, M.D. DATE OF BIRTH:  1965-12-07   DATE OF CONSULTATION:  DATE OF DISCHARGE:  04/02/2005                                   CONSULTATION   REASON FOR CONSULTATION:  Right foot injury with swelling.   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  She was walking with flip flops in her back yard when  she slid forward and jammed the middle of her right foot into a tree.  There  is swelling.  There is no broken skin.  There is good color of all five  toes.  There is apparent shortening of the second and third toe compared to  the opposite foot.  Orthopedic history is positive for a bunionectomy on the  right first metatarsal several years ago and positive for a recent  dislocation/right trough to the left ankle.  X-ray is obtained showing  comminuted and displaced fractures of both second, third and fourth distal  metatarsals with swelling.  There is some question of impact injury at the  tarsal metatarsal joint.  This is not completely clear on x-rays.   PLAN:  Case discussed with Dr. Fuller Canada and with recommendations as  follows:  Posterior splint applied in the ER with casting and no weight  bearing; prescription for Demerol 50 mg x40 tablets; and Phenergan 25 mg x20  tablets given to patient and filled this evening (Pain rate is an 8/10  despite Motrin and Lorcet taken prior coming to the ER); elevation of foot  with ice for 20 minutes every few hours.   FOLLOWUP:  Follow up in two days with Dr. Romeo Apple.   ADDENDUM:  Will give consideration to Dexascan for bone density to be given  _since she is ___ status post hysterectomy, and has now had  rather  impressive fracture response and injury to two consecutive injuries.      Tilda Burrow, M.D.  Electronically Signed     JVF/MEDQ  D:  04/02/2005  T:  04/03/2005  Job:  [id number]   cc:   Vickki Hearing, M.D.  Fax: [phone]

## 2010-12-22 NOTE — Discharge Summary (Signed)
[facility]  Patient:    Alexandra Reid, Alexandra Reid Visit Number: [id number] MRN: [id number]          Service Type: MED Location: 4A A420 01 Attending Physician:  Lazaro Arms Dictated by:   Turner Daniels, M.D. Admit Date:  11/27/2001 Discharge Date: 11/30/2001                             Discharge Summary  DISCHARGE DIAGNOSES: 1. Status post total abdominal hysterectomy, bilateral salpingo-oophorectomy. 2. Moderate severe endometriosis. 3. Unremarkable postoperative course.  PROCEDURE: 1. Total abdominal hysterectomy, bilateral salpingo-oophorectomy. 2. Abdominoplasty/paniculectomy.  HISTORY OF PRESENT ILLNESS:  Please refer to the transcribed history and physical as well as the operative report for details of admission to the hospital.  HOSPITAL COURSE:  Patient was admitted after surgery.  Her intraoperative course was complicated by finding bilateral ovarian endometriomas, significantly wide spread peritoneal endometriosis, and the accompanying adhesions and thickened perineum that goes along with that condition.  By the [employer] she would probably be moderate to severe endometriosis patient.  As a result, I made an intraoperative decision to remove not only the uterus, but the tubes and ovaries bilaterally as well.  I consulted with her husband Brett Canales who I had had an informal relationship with previously that it was my opinion that the most appropriate thing to do was to remove both tubes and ovaries.  After talking for several minutes he agreed and we proceeded with that.  Postoperatively the patient did well.  She tolerated clear liquids and progression to a regular diet.  She did have some voiding difficulty and required Urecholine which she will be sent home with.  She was given Tylox for pain and also some nonsteroidals sporadically for pain management.  Her incision remained clean, dry, and intact.  The bandage was removed  on postoperative day #3 without evidence of infection or seroma formation.  The JP drain was in and continued to drain a mild amount of fluid.  She will be sent home with that.  She was passing flatus and had normal ambulatory activities at the time of discharge.  She was discharged to home on Tylox and Bextra for pain, Climara patches, and Estratest full-strength for hormone replacement, Ambien for sleep, Urecholine p.r.n. for bladder spasm.  She was given instructions and precautions for calling our office prior to her postoperative visit which is scheduled for Wednesday, April 30 at 10 a.m. to evaluate her incision. Dictated by:   Turner Daniels, M.D. Attending Physician:  Lazaro Arms DD:  11/30/01 TD:  12/01/01 Job: [id number]
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
## 2010-12-22 NOTE — Op Note (Signed)
[facility]  Patient:    Alexandra Reid, Alexandra Reid Visit Number: [id number] MRN: [id number]          Service Type: MED Location: 4A A420 01 Attending Physician:  Lazaro Arms Dictated by:   Duane Lope, M.D. Proc. Date: 11/27/01 Admit Date:  11/27/2001                             Operative Report  PREOPERATIVE DIAGNOSES:  1. Dysmenorrhea.  2. Hypermenorrhea.  3. Left lower quadrant pain.  4. Left ovarian cyst.  5. Positional dyspareunia.  POSTOPERATIVE DIAGNOSES:  1. Dysmenorrhea.  2. Hypermenorrhea.  3. Left lower quadrant pain.  4. Left ovarian cyst.  5. Positional dyspareunia.  6. Endometriosis [employer] grade 3.  7. Bilateral ovarian endometriomas.  PROCEDURE:  1. Abdominal hysterectomy with bilateral salpingo-oophorectomy.  2. Abdominoplasty.  SURGEON:  Duane Lope, M.D.  ASSISTANT:  Christin Bach, M.D.  ANESTHESIA:  General endotracheal.  FINDINGS:  Upon entry into the peritoneal cavity, the patient had obvious marked severe endometriosis. She had bilateral ovarian endometriomas with endometriosis peppered on both ovaries on the peritoneal surfaces. All the peritoneal surfaces were thickened. The uterine serosa had evidence of endometriosis or at least old endometriosis on it even if it was not active. She had a false posterior cul-de-sac and her bladder flap peritoneum was also very thickened. Everything bled quite easily because of all the endometriosis. The upper abdomen palpated normally, liver, gallbladder, and both kidneys and there were no other peritoneal abnormalities seen.  DESCRIPTION OF PROCEDURE:  The patient was taken to the operating room, placed in the supine position where she underwent general endotracheal anesthesia. She was prepped and draped in the usual sterile fashion. A Foley catheter was placed and her vagina was prepped as well. Dr. Emelda Fear did drawings for abdominoplasty and made an  incision in a Pfannenstiel fashion and excised quite a bit and subcutaneous fat. This portion of abdominal wall and skin was then removed and then sent to pathology. Hemostasis was performed using the electrocautery unit. The fascia was then incised and extended laterally and the fascia was taken off the muscle superiorly and inferiorly without difficulty. The muscles were divided and peritoneal cavity was entered. A Balfour self retaining retractor was placed, the upper abdomen was packed away and the patient was placed in the Trendelenburg position. The above noted findings of marked severe endometriosis were seen. At that point after examining the pelvis and both ovaries, I went out and talked to Navos husband, Brett Canales, and discussed with him what I thought the appropriate procedure was at that point which was to proceed with a bilateral salpingo-oophorectomy in addition to the abdominal hysterectomy as planned. The anticipated cyst was an endometrioma and both ovaries were adherent to the pelvic sidewall to the back surface of the uterus because of the endometriosis. I talked to him for several minutes and told him that I felt like the appropriate treatment for my wife would be a TAH/BSO and he concurred. I rescrubbed and went back into the OR and we proceeded with such. The left round ligament was suture ligated and cut. The infundibulopelvic ligament on the left was isolated, an avascular window made, it was well away from the ureter. The ovary and tube had to be bluntly dissected off the pelvic side wall and posterior surface of the uterus, it was stuck because of endometriosis. The infundibulopelvic ligament was double clamped, cut  and double suture ligated with good hemostasis. The vesicouterine serosal flap was created on the left. The bladder peritoneum was quite thick. The uterine vessels were skeletonized on the left. The right round ligament was suture ligated and cut. An  avascular window was made and the right ureter identified. The right ovary was manually dissected off the pelvic sidewall and back surface of the uterus. It again was adherent and we ruptured endometrioma at that time and a large amount of chocolate fluid came out. The infundibulopelvic ligament was then double clamped, cut and double suture ligated with good hemostasis. The bladder flap on the right was created and the bladder was pushed off the lower uterine segment. The uterine vessels on the right were skeletonized. Both uterine vessels were clamped, cut and suture ligated with good hemostasis. Serial pedicles were taken down the cervix through the cardinal ligament each pedicle being clamped, cut, transfixed and suture ligated and cut. We then encountered the vagina and made a stab incision in the anterior vaginal wall and the uterine tubes and ovaries were removed with Mayo scissors and the vagina was cut. The specimen was sent to pathology for evaluation. The vagina was closed initially anterior to posterior side to side. Vaginal angled sutures were placed bilaterally for hemostasis and there was no bleeding into the vagina at this point from vaginal angles. Once about a third of each side was closed, the vagina was closed in a cephalad to caudad fashion to improve vaginal length and support. This was done with interrupted sutures and additional interrupted figure-of-eight sutures were placed for hemostasis. The pelvis was irrigated vigorously. The pedicles were found to be hemostatic. A cul-de-sac obliteration was then performed. The uterosacral ligaments were plicated bilaterally and to 2 fingerbreadth width for additional vaginal support. The cardinal ligaments were then sutured together bilaterally for additional vaginal and bladder support.  All pedicles were found again to be hemostatic. The pelvis was irrigated vigorously. The anterior peritoneum was sewn to the posterior  peritoneum with the box suture. The packs were removed. The Balfour self retaining retractor was removed. The peritoneum was closed with running  3-0 Vicryl. The muscles were reapproximated using #0 chromic. The subcutaneous tissue and the muscles were irrigated and made hemostatic. A small fascial excision was then performed in order to have increased tightness of the abdominal wall and the fascia was closed with running #0 Vicryl sutures after the excision of the excess fascia was performed. The subcutaneous tissue was once again irrigated, made hemostatic and closed with interrupted 3-0 Vicryl sutures. A JP drain was then placed through a stab incision in the right lower quadrant and the skin edges were closed using the skin staples. A JP drain was then anchored in place using 3-0 Ethibond suture. The patient tolerated the procedure well. She experienced 500 cc of blood loss and was taken to the recovery room in good stable condition. All counts were correct x3. Dictated by:   Duane Lope, M.D. Attending Physician:  Lazaro Arms DD:  11/27/01 TD:  11/27/01 Job: [id number]
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
## 2010-12-22 NOTE — Op Note (Signed)
NAME:  Alexandra Reid, Alexandra Reid               ACCOUNT NO.:  [id number]   MEDICAL RECORD NO.:  [id number]          PATIENT TYPE:  AMB   LOCATION:  DAY                           FACILITY:  [facility]   PHYSICIAN:  Vickki Hearing, M.D.DATE OF BIRTH:  10/13/1965   DATE OF PROCEDURE:  06/05/2005  DATE OF DISCHARGE:                                 OPERATIVE REPORT   PREOPERATIVE DIAGNOSIS:  Fracture second, third metatarsal right foot.   POSTOPERATIVE DIAGNOSIS:  Fracture second, third metatarsal right foot.   PROCEDURE:  Open treatment internal fixation, second and third metatarsals.   PRIMARY INDICATION:  Pain and dorsiflexed position of the second and third  digit.   HISTORY:  This is a 45 year old female had sustained a second, third and  fourth metatarsal fracture of the right foot was treated with cast followed  by bracing. When she started weightbearing after eight weeks, she continued  to have a major pain in the foot and also complained that her second and  third digits did not reach the floor. Repeat radiographs and CT scan were  done to further delineate her fractures, and there was some notable  deformity mainly in the medial lateral plane, although this did not bare  itself clinically, and clinically her second third toes did not reach  plantigrade.   SURGEON:  Dr. Romeo Apple. There was no assistant.   ANESTHETIC:  General.   TOURNIQUET TIME:  One hour and 30 minutes.   IMPLANTS USED:  A 0.045 K-wire in the third digit 0.062 K-wire in the second  digit, and we also added DBX bone putty as a graft.   The patient was seen in the preop holding area. She marked the second and  third digits as the surgical sites. She was given preoperative antibiotic.  History and physical was then updated. She was taken to the operating room  where general anesthesia was done. The right leg was prepped in sterile  technique. Time-out was taken and completed, and tourniquet was elevated  after  exsanguination of limb with an Esmarch. We elevated to 175 mmHg where  it stayed 1 hour and 30 minutes.   There was no motion at the fracture sites, and therefore, we made two  incisions, one over the second on the tibial border and one over the third  on the fibular border. We started with the second, incised the skin down to  the extensor mechanism, retracted extensor mechanism with sharp dissection  medially to expose the fracture site which was solid. We took a osteotome  and used the C-arm to break apart the healed fracture. We realigned it and  passed a K-wire from the toe into the metatarsal. We took another radiograph  and got a very good reduction.   We then dressed the third digit, performed the same approach. Of note, the  canal of the third metatarsal was sclerosed, and it took several passages to  pass the pin into the metatarsal. This fracture was more difficult to  realign then the second.   After irrigating the wound, we applied the bone graft putty to  both fracture  sites and closed with 2-0 Monocryl, 3-0 nylon. We did a dorsal and lateral  ankle block and also a digital block to both toes as well as the incision  with 30 cc of plain Marcaine and then wrapped sterile dressing and extubated  the patient and took her to recovery in stable condition. At this time, the  postoperative plan is for nonweightbearing for six weeks, ice and elevation  the next 72 hours, follow up on Monday for a dressing change.      Vickki Hearing, M.D.  Electronically Signed     SEH/MEDQ  D:  06/05/2005  T:  06/05/2005  Job:  [id number]

## 2010-12-22 NOTE — Op Note (Signed)
NAME:  Alexandra Reid, Alexandra Reid                         ACCOUNT NO.:  [id number]   MEDICAL RECORD NO.:  [id number]                   PATIENT TYPE:  INP   LOCATION:  A420                                 FACILITY:  [facility]   PHYSICIAN:  R. Roetta Sessions, M.D.              DATE OF BIRTH:  [birth year]   DATE OF PROCEDURE:  03/13/2004  DATE OF DISCHARGE:                                 OPERATIVE REPORT   PROCEDURE:  ERCP with sphincterotomy and balloon dredging of bile duct.   INDICATIONS FOR PROCEDURE:  The patient is a 45 year old with recent  worsening of chronic postprandial epigastric right upper quadrant abdominal  pain and nausea and vomiting.  She saw Dr. Malvin Johns last week.  Ultrasound  of the right upper quadrant demonstrated a gallbladder loaded with stones  and a dilated bile duct, 9 mm, and a shadowing echogenic focus convincing  for common bile duct stone.  I reviewed the sonogram pictures personally  with Dr. Loraine Leriche __________.  Her LFTs are normal.  Plans are for her to have a  laparoscopic cholecystectomy tomorrow, and Dr. Malvin Johns has asked me to see  her to perform ERCP to clear her bile duct prior to surgery.  I have seen  Ms. Fittro.  I have discussed the approach of ERCP with the potential for  sphincterotomy and stone extraction.  We talked about the potential risks,  benefits, and alternatives.  We talked about specifically the risks  including but not limited to a 1 in 10 chance of pancreatitis, perforation,  or reaction to medications.  Her questions were answered.  She is agreeable.  Please see my H&P for more information.   DESCRIPTION OF PROCEDURE:  The patient was taken to the operating room.  General anesthesia was induced by Dr. Jayme Cloud and associates.  She was  placed in a semiprone position.  The instrument used was the Olympus video  chip system.  The patient received Levaquin 250 mg IV prior to the  procedure.   FINDINGS:  Cursory examination of the distal  esophagus, stomach, D1 and D2  revealed no abnormalities.  The ampulla of Vader was somewhat small but  readily located on the medial wall of the second portion of the duodenum.  The scope was pulled back to the short position 55 cm from the incisors.  A  scout film was taken using the Microvasive sphincterotome.  The ampulla was  impacted.  A deep cannulation along with a safety wire was almost  immediately obtained.  There were multiple small filling defects noted when  her cholangiogram was performed.  It was not determined whether these  represented air bubbles or calculi.  The biliary tree was somewhat full but  did not appear to be dilated.  The gallbladder was loaded with gallstones.  I elected to go ahead and perform a sphincterotomy and dredge the duct.  I  placed a safety wire deep  in the biliary tree and pulled the sphincterotome  back across the ampullary orifice.  At the 12 o'clock position, a somewhat  modest sphincterotomy was performed because of an overlying fold using the  Erbe unit, this was done without difficulty.  Subsequently, I placed a  graduated balloon deep in the biliary tree, partially inflated it, and  performed a balloon occlusion cholangiogram.  There were no stones delivered  through the ampullary orifice with this maneuver.  I noted that the duct  appeared not to drain as well as I would have liked to have seen.  I  subsequently saw that there was more of an intramural segment spanning a  fold than initially seen.  I subsequently reintroduced the sphincterotome  with a guidewire and placed a safety wire deep in the biliary tree and  extended the sphincterotomy to about 1 cm at the 12 o'clock position.  This  was done without difficulty.  There was excellent drainage of residual  contrast/bile.  The patient tolerated the procedure well and was taken to  the recovery room in stable condition.   IMPRESSION:  1. Normal-appearing ampulla.  Somewhat full  biliary tree.  Filling defects     in the distal common bile duct most likely representing air bubbles,     status post sphincterotomy, balloon dredging of the bile duct.  2. Gallbladder loaded with stones.  3. The pancreatic duct was not injected.   RECOMMENDATIONS:  1. Her potassium was low prior to the procedure.  It has been repleted and     serum potassium will be rechecked.  2. Clear liquid diet.  3. Admit to Dr. Malvin Johns with plans for laparoscopic cholecystectomy     tomorrow morning.  4. Will repeat blood work, including MET7, CBC, LFTs, and amylase tomorrow     morning.  5. Will start her on 1 g of Rocephin IV q.24h. after discussion with Dr.     Malvin Johns.  6. I have discussed my findings and recommendations also with the patient's     husband.      ___________________________________________                                            Jonathon Bellows, M.D.   RMR/MEDQ  D:  03/13/2004  T:  03/14/2004  Job:  [id number]   cc:   Barbaraann Barthel, M.D.  [address]  [place]  [place] [address]  Fax: [phone]

## 2010-12-22 NOTE — Discharge Summary (Signed)
NAME:  Alexandra Reid, Alexandra Reid                         ACCOUNT NO.:  [id number]   MEDICAL RECORD NO.:  [id number]                   PATIENT TYPE:  INP   LOCATION:  A420                                 FACILITY:  [facility]   PHYSICIAN:  Barbaraann Barthel, M.D.              DATE OF BIRTH:  1966/01/27   DATE OF ADMISSION:  03/13/2004  DATE OF DISCHARGE:  03/15/2004                                 DISCHARGE SUMMARY   DIAGNOSES:  Cholecystitis, cholelithiasis, choledocholithiasis.   PROCEDURE:  On March 13, 2004, ERCP; on March 14, 2004, laparoscopic  cholecystectomy.   CONSULTATIONS:  Dr. Jena Gauss, gastroenterology.   NOTE:  This is a 45 year old white female who had recurrent episodes of  right upper quadrant pain and nausea and vomiting. She was noted to have  mildly dilated hepatic radicals with stone in the common bile duct. She was  admitted. GI service saw her, and ERCP was performed. Sphincterotomy was  performed. No stone was extracted. The patient was then taken 24 hours later  to have a laparoscopic cholecystectomy.   Hospital course is unremarkable other than the patient had some recalcitrant  and idiopathic hypokalemia. She required potassium replacement  preoperatively and postoperatively. Other than that, she did well  postoperatively. Her wounds were clean without drainage. She was voiding  with some mild discomfort after Foley catheterization but otherwise no  problem. No dysuria per se. She had no leg pain or shortness of breath and  was doing well.   LABORATORY DATA:  On discharge, her white count was 6.9 with H and H of 10.4  and 30.4. Her liver function studies were all normal with a bilirubin total  of 0.4 with direct 0.1, indirect 0.3. Her electrolytes were within normal  limits with a potassium of 3.7. Her creatinine was 0.8.   DISCHARGE INSTRUCTIONS:  We will see her perioperatively. She is excused  from work. She may have a full liquid to soft diet. She is to increase her  activities as tolerated. She is permitted to shower. No sexual activity. No  driving. No heavy lifting. She may resume her preoperative medications,  clean wounds with alcohol as instructed. She is told not to take any aspirin  products, and she is given a prescription for Darvocet 1 tablet every 4  hours as needed for pain. We will follow her in the office perioperatively.     ___________________________________________                                         Barbaraann Barthel, M.D.   WB/MEDQ  D:  03/15/2004  T:  03/15/2004  Job:  [id number]

## 2010-12-25 ENCOUNTER — Encounter: Payer: Self-pay | Admitting: Family Medicine

## 2010-12-26 ENCOUNTER — Encounter: Payer: Self-pay | Admitting: Family Medicine

## 2010-12-26 ENCOUNTER — Ambulatory Visit: Payer: 59 | Admitting: Family Medicine

## 2010-12-26 ENCOUNTER — Ambulatory Visit (INDEPENDENT_AMBULATORY_CARE_PROVIDER_SITE_OTHER): Payer: 59 | Admitting: Family Medicine

## 2010-12-26 DIAGNOSIS — K219 Gastro-esophageal reflux disease without esophagitis: Secondary | ICD-10-CM

## 2010-12-26 DIAGNOSIS — M797 Fibromyalgia: Secondary | ICD-10-CM

## 2010-12-26 DIAGNOSIS — E042 Nontoxic multinodular goiter: Secondary | ICD-10-CM

## 2010-12-26 DIAGNOSIS — R590 Localized enlarged lymph nodes: Secondary | ICD-10-CM

## 2010-12-26 DIAGNOSIS — F329 Major depressive disorder, single episode, unspecified: Secondary | ICD-10-CM

## 2010-12-26 DIAGNOSIS — A689 Relapsing fever, unspecified: Secondary | ICD-10-CM

## 2010-12-26 DIAGNOSIS — R599 Enlarged lymph nodes, unspecified: Secondary | ICD-10-CM

## 2010-12-26 DIAGNOSIS — K623 Rectal prolapse: Secondary | ICD-10-CM

## 2010-12-26 DIAGNOSIS — R634 Abnormal weight loss: Secondary | ICD-10-CM

## 2010-12-26 DIAGNOSIS — F3289 Other specified depressive episodes: Secondary | ICD-10-CM

## 2010-12-26 DIAGNOSIS — IMO0001 Reserved for inherently not codable concepts without codable children: Secondary | ICD-10-CM

## 2010-12-26 DIAGNOSIS — R079 Chest pain, unspecified: Secondary | ICD-10-CM

## 2010-12-26 LAB — CBC WITH DIFFERENTIAL/PLATELET
Eosinophils Relative: 1.9 % (ref 0.0–5.0)
HCT: 33.7 % — ABNORMAL LOW (ref 36.0–46.0)
Monocytes Relative: 5.8 % (ref 3.0–12.0)
Neutrophils Relative %: 82.2 % — ABNORMAL HIGH (ref 43.0–77.0)
Platelets: 255 10*3/uL (ref 150.0–400.0)
RBC: 3.77 Mil/uL — ABNORMAL LOW (ref 3.87–5.11)
WBC: 9.9 10*3/uL (ref 4.5–10.5)

## 2010-12-26 LAB — COMPREHENSIVE METABOLIC PANEL
ALT: 16 U/L (ref 0–35)
Albumin: 4 g/dL (ref 3.5–5.2)
CO2: 29 mEq/L (ref 19–32)
Calcium: 9.2 mg/dL (ref 8.4–10.5)
Chloride: 97 mEq/L (ref 96–112)
GFR: 95.72 mL/min (ref >60.00)
Sodium: 132 mEq/L — ABNORMAL LOW (ref 135–145)
Total Protein: 7.2 g/dL (ref 6.0–8.3)

## 2010-12-26 LAB — SEDIMENTATION RATE: Sed Rate: 14 mm/hr (ref 0–22)

## 2010-12-26 MED ORDER — OXYCODONE HCL 60 MG PO TB12: 1.0000 | ORAL_TABLET | Freq: Two times a day (BID) | ORAL | Status: DC

## 2010-12-26 MED ORDER — ESOMEPRAZOLE MAGNESIUM 40 MG PO CPDR: 40.0000 mg | DELAYED_RELEASE_CAPSULE | Freq: Every day | ORAL | Status: DC

## 2010-12-26 MED ORDER — OXYCODONE HCL 5 MG PO TABS: ORAL_TABLET | ORAL | Status: DC

## 2010-12-26 MED ORDER — CLONAZEPAM 1 MG PO TABS: 1.0000 mg | ORAL_TABLET | ORAL | Status: DC

## 2010-12-26 NOTE — Progress Notes (Signed)
OFFICE VISIT  12/26/2010   CC:  Chief Complaint  Patient presents with  . Depression  . Anxiety  . Fever    normal today     HPI:    Patient is a 45 y.o. Caucasian female who presents for f/u fibromyalgia/chronic pain syndrome, recurrent/relapsing fever. Says she's been feeling somewhat better since last f/u 1 mo ago: nauseated less, Tm of 100 on one occasion only.   Pain levels same, same areas as her usual.  She has initial appt with psychiatrist 01/03/11.    Not taking the amlodipine rx'd for her suspected coronary artery vasospasm b/c it makes her feel excessively tired/sick.  Still experiencing "occasional" short periods of nonexertional CP.    She does want a referral to a surgeon at this time b/c of persistent problems with what she refers to as a large prolapsing hemorrhoid. Has been problematic for about 64mo or so.  She has history of constipation/IBS, plus some internal hemorrhoids that have caused some intermittent BRBPR.    She asks for RF of nexium today, says she takes it bid.  ROS: +prominent IBS symptoms that have severe/debilitating effects sometimes for 3-4 days at a time. Chronic nausea.  Recurrent migraine HA's.  Depression and anxiety.  Past Medical History  Diagnosis Date  . DDD (degenerative disc disease), lumbar   . Headache   . Anxiety   . Fibromyalgia     Dr. Hope Pigeon [phone]  . Interstitial cystitis   . Obesity   . Insomnia   . Transaminasemia 03/2010    (ALT 49), normal on f/u testing off of daily tylenol uuse.  Marland Kitchen GERD (gastroesophageal reflux disease) 06/01/10    Dyspepsia/gastritis, EGD: gastritis (NSAIDS)  . Rectal bleeding 06/01/10    Anorectal bleeding/BRBPR--colonoscopy: internal hemorrhoids  . Recurrent UTI     renal u/s normal 07/2007  . Weight loss     CT abd/pelf 08/22/10 remakable only for bile duct 9mm (unchanged from 03/2004 PRIOR to ERCP w/spincterotomy, bile duct balloon dredging and lap chole)  . Recurrent fever    W/u unrevealing, including ID consult.    . Asthma   . Osteopenia     bone densitometry 2006 per pt  . 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 Christella Hartigan  . Multinodular goiter 11/2010    TSH normal    Past Surgical History  Procedure Date  . Bunionectomy     right foot  . Abdominal hysterectomy   . Bilateral salpingoophorectomy 2003    endometriosis  . Cholecystectomy 2005    lap--(cholelithiasis w/choledocholithiasis on u/s 2005--PreOperative ERCP showed NO stone or other obstruction in the biliary tract, but CBD messured 9mm near head of pancreas.  S sphincterotomy and bile duct balloon dredging was done at that time.  MRCP 10/2010 NORMAL.  Marland Kitchen Orif metatarsal fracture 2006    Right 2nd and 3rd metatarsals (Dr. Romeo Apple)  . Biopsy thyroid 11/2010    Fine needle: NONmalignant goiter    Outpatient Prescriptions Prior to Visit  Medication Sig Dispense Refill  . albuterol (PROAIR HFA) 108 (90 BASE) MCG/ACT inhaler Inhale 2 puffs into the lungs every 4 (four) hours as needed.        Marland Kitchen amitriptyline (ELAVIL) 100 MG tablet Take 100 mg by mouth at bedtime.        . Ascorbic Acid (VITAMIN C) 500 MG tablet Take 500 mg by mouth daily.        . Calcium Carbonate (CALCIUM 500 PO) Take 1  tablet by mouth daily.        . carisoprodol (SOMA) 350 MG tablet Take 350 mg by mouth 3 (three) times daily as needed.        . DULoxetine (CYMBALTA) 60 MG capsule Take 1 capsule (60 mg total) by mouth daily.  30 capsule  5  . Fish Oil OIL Take 2 capsules by mouth daily.        . fluconazole (DIFLUCAN) 150 MG tablet 1 tab once daily x 1d, may repeat in 3d if still symptomatic  2 tablet  2  . Fluticasone-Salmeterol (ADVAIR DISKUS) 250-50 MCG/DOSE AEPB Inhale 1 puff into the lungs every 12 (twelve) hours.        . ondansetron (ZOFRAN-ODT) 8 MG disintegrating tablet Take 8 mg by mouth every 8 (eight) hours as needed. For nausea       . phenazopyridine (PYRIDIUM) 200 MG tablet Take 1 tablet  (200 mg total) by mouth 3 (three) times daily as needed for pain.  20 tablet  6  . promethazine (PHENERGAN) 25 MG tablet Take 1-2 tablets every 6 hours as needed for nausea  90 tablet  0  . zolpidem (AMBIEN) 10 MG tablet Take 10 mg by mouth at bedtime as needed.        . clonazePAM (KLONOPIN) 1 MG tablet Take 1 tablet (1 mg total) by mouth as directed. Take 1 tab po q8h  90 tablet  2  . esomeprazole (NEXIUM) 40 MG capsule Take 40 mg by mouth daily before breakfast.        . oxyCODONE (OXY IR/ROXICODONE) 5 MG immediate release tablet Take 1-2 tablets by mouth every 4 hours as needed for breakthrough pain  300 tablet  0  . Oxycodone HCl (OXYCONTIN) 60 MG TB12 Take 1 tablet (60 mg total) by mouth 2 (two) times daily. Pain contract in chart  60 each  0  . amLODipine (NORVASC) 5 MG tablet Take 1 tablet (5 mg total) by mouth daily.  90 tablet  3  . ciprofloxacin (CIPRO) 500 MG tablet Take 500 mg by mouth 2 (two) times daily.       Not currently taking cipro or amlodipine.  No Known Allergies  ROS As per HPI  PE: Blood pressure 110/74, pulse 103, temperature 97.4 F (36.3 C), temperature source Oral, height 5' 4.5" (1.638 m), weight 183 lb (83.008 kg), SpO2 100.00%. Gen: Alert, well appearing.  Patient is oriented to person, place, time, and situation. Right antecubital fossa has a soft, mildly tender soft-tissue mass palpable in medial portion.  This mass is moveable and without overlying erythema or induration.  No tissue fluctuation or pulsation.  No significant forearm, wrist, or hand bruising or swelling.  No streaking.  Both axillae are without palpable nodule or mass or tenderness. Rectal: no significant external hemorrhoids.  Anal tone normal.  With valsalva maneuver a generous portion of the rectum prolapses externally (not circumferentially, but about 2/3).  This returns to normal internal position with release of valsalva.  No mass visible or palpable.  LABS:  none  IMPRESSION AND  PLAN:  Partial rectal prolapse Refer to general surgery for further evaluation. Of note, she had an unremarkable colonoscopy in the fall of 2011 and was not having any rectal prolapse at that time. Certainly her chronic opioid therapy is making her constipation worse, but she is not willing to consider any discontinuation of this med at this time b/c of it's help for her chronic pain.  WEIGHT LOSS  This has stabilized/improved over the last 6 wks.  FEVER, RECURRENT Quiescent/resolved over the last 1 month. W/u unrevealing.  Epitrochlear lymphadenopathy She has one node palpable in right anticubital fossa area which is new for her. Given her history of unexplained fevers, this finding raises the issue of possible malignancy again. However, will research/read some before making our next step. CBC, CMET, and ESR checked today.  DEPRESSION She has initial appt with a psychiatrist on 01/03/11.  I definitely want her to keep this appt.  FIBROMYALGIA, SEVERE Problem stable.  Continue current medications and diet appropriate for this condition.  We have reviewed our general long term plan for this problem and also reviewed symptoms and signs that should prompt the patient to call or return to the office. Rx's for her oxycodone and oxycontin were rx'd today, as well as for her klonopin.  GERD Rf'd nexium today and asked her to start taking it QD instead of bid.  Chest pain Cath 10/2010 showed clean coronaries and normal EF.  Coronary vasospasm dx'd. Intolerant of 5mg  amlodipine (nonspecific malaise), so I advised her to try 1/2 of the 5mg  tab once daily to see if this is tolerable.     FOLLOW UP: Return in about 1 month (around 01/26/2011).

## 2010-12-26 NOTE — Assessment & Plan Note (Signed)
Quiescent/resolved over the last 1 month. W/u unrevealing.

## 2010-12-26 NOTE — Assessment & Plan Note (Signed)
She has initial appt with a psychiatrist on 01/03/11.  I definitely want her to keep this appt.

## 2010-12-26 NOTE — Assessment & Plan Note (Signed)
Refer to general surgery for further evaluation. Of note, she had an unremarkable colonoscopy in the fall of 2011 and was not having any rectal prolapse at that time. Certainly her chronic opioid therapy is making her constipation worse, but she is not willing to consider any discontinuation of this med at this time b/c of it's help for her chronic pain.

## 2010-12-26 NOTE — Assessment & Plan Note (Signed)
She has one node palpable in right anticubital fossa area which is new for her. Given her history of unexplained fevers, this finding raises the issue of possible malignancy again. However, will research/read some before making our next step. CBC, CMET, and ESR checked today.

## 2010-12-26 NOTE — Assessment & Plan Note (Signed)
Cath 10/2010 showed clean coronaries and normal EF.  Coronary vasospasm dx'd. Intolerant of 5mg  amlodipine (nonspecific malaise), so I advised her to try 1/2 of the 5mg  tab once daily to see if this is tolerable.

## 2010-12-26 NOTE — Assessment & Plan Note (Signed)
Problem stable.  Continue current medications and diet appropriate for this condition.  We have reviewed our general long term plan for this problem and also reviewed symptoms and signs that should prompt the patient to call or return to the office. Rx's for her oxycodone and oxycontin were rx'd today, as well as for her klonopin.

## 2010-12-26 NOTE — Assessment & Plan Note (Signed)
This has stabilized/improved over the last 6 wks.

## 2010-12-26 NOTE — Assessment & Plan Note (Signed)
Rf'd nexium today and asked her to start taking it QD instead of bid.

## 2010-12-27 ENCOUNTER — Other Ambulatory Visit: Payer: 59

## 2010-12-27 ENCOUNTER — Ambulatory Visit: Payer: 59 | Admitting: Family Medicine

## 2010-12-27 DIAGNOSIS — R599 Enlarged lymph nodes, unspecified: Secondary | ICD-10-CM

## 2010-12-28 ENCOUNTER — Other Ambulatory Visit: Payer: Self-pay | Admitting: Family Medicine

## 2010-12-28 ENCOUNTER — Telehealth: Payer: Self-pay | Admitting: Family Medicine

## 2010-12-28 DIAGNOSIS — R599 Enlarged lymph nodes, unspecified: Secondary | ICD-10-CM

## 2010-12-28 NOTE — Telephone Encounter (Signed)
Informed pt of normal blood test results from 2d/a. Discussed next step in eval of her right antecubital fossa soft tissue mass: get u/s to confirm that it is a lymph node.  If it is LN and has normal architecture on U/S then we'll follow it clinically for resolution over the next 1-2 months.  If it is LN and architecture NOT normal, then either FNA biopsy will be attempted OR she'll be referred for excision of this. Patient understands and wishes to proceed with u/s so we'll set this up at [facility] radiology in [place].

## 2010-12-29 ENCOUNTER — Other Ambulatory Visit: Payer: Self-pay | Admitting: Family Medicine

## 2010-12-29 ENCOUNTER — Encounter: Payer: Self-pay | Admitting: Family Medicine

## 2010-12-29 ENCOUNTER — Ambulatory Visit (HOSPITAL_COMMUNITY)
Admission: RE | Admit: 2010-12-29 | Discharge: 2010-12-29 | Disposition: A | Discharge status: Home / Self Care | Payer: 59 | Source: Ambulatory Visit | Attending: Family Medicine | Admitting: Family Medicine

## 2010-12-29 DIAGNOSIS — R599 Enlarged lymph nodes, unspecified: Secondary | ICD-10-CM

## 2010-12-29 DIAGNOSIS — R29898 Other symptoms and signs involving the musculoskeletal system: Secondary | ICD-10-CM | POA: Insufficient documentation

## 2011-01-17 ENCOUNTER — Telehealth: Payer: Self-pay | Admitting: *Deleted

## 2011-01-17 NOTE — Telephone Encounter (Signed)
Pt notified.  She states she has changed her appt to 01/19/11.  Advised to seek immediate emergency care if any change in symptoms and she believes pain to be cardiac in nature.  Pt is agreeable.

## 2011-01-17 NOTE — Telephone Encounter (Signed)
Noted.  Keep f/u as prev scheduled.  No new rec's.

## 2011-01-17 NOTE — Telephone Encounter (Signed)
Pt called on 6/11 and stated she was having pain in her chest. Pain begins in sternum and radiates to right side.  Pt states the pain feels like she may have pulled something.  She feels the pain more when she is laughing or taking a deep breath.  Pt denies any shortness of breath, left sided pain, jaw pain or clamminess.  Pt states pain began about 1.5 weeks ago.  Pt has appt on 01/26/11 for follow up with Dr. Milinda Cave.  It was my error that note was not created on 6/11.

## 2011-01-19 ENCOUNTER — Encounter: Payer: Self-pay | Admitting: Family Medicine

## 2011-01-19 ENCOUNTER — Ambulatory Visit (INDEPENDENT_AMBULATORY_CARE_PROVIDER_SITE_OTHER): Payer: 59 | Admitting: Family Medicine

## 2011-01-19 VITALS — BP 146/102 | HR 104 | Temp 97.6°F | Ht 64.5 in | Wt 186.4 lb

## 2011-01-19 DIAGNOSIS — R079 Chest pain, unspecified: Secondary | ICD-10-CM

## 2011-01-19 DIAGNOSIS — E042 Nontoxic multinodular goiter: Secondary | ICD-10-CM

## 2011-01-19 DIAGNOSIS — F329 Major depressive disorder, single episode, unspecified: Secondary | ICD-10-CM

## 2011-01-19 DIAGNOSIS — R21 Rash and other nonspecific skin eruption: Secondary | ICD-10-CM

## 2011-01-19 DIAGNOSIS — F3289 Other specified depressive episodes: Secondary | ICD-10-CM

## 2011-01-19 DIAGNOSIS — M797 Fibromyalgia: Secondary | ICD-10-CM

## 2011-01-19 DIAGNOSIS — K623 Rectal prolapse: Secondary | ICD-10-CM

## 2011-01-19 DIAGNOSIS — IMO0001 Reserved for inherently not codable concepts without codable children: Secondary | ICD-10-CM

## 2011-01-19 MED ORDER — PROMETHAZINE HCL 25 MG PO TABS: ORAL_TABLET | ORAL | Status: DC

## 2011-01-19 MED ORDER — OXYCODONE HCL 60 MG PO TB12: 1.0000 | ORAL_TABLET | Freq: Two times a day (BID) | ORAL | Status: DC

## 2011-01-19 MED ORDER — DULOXETINE HCL 60 MG PO CPEP: 60.0000 mg | ORAL_CAPSULE | Freq: Every day | ORAL | Status: DC

## 2011-01-19 MED ORDER — OXYCODONE HCL 5 MG PO TABS: ORAL_TABLET | ORAL | Status: DC

## 2011-01-19 MED ORDER — CARISOPRODOL 250 MG PO TABS: ORAL_TABLET | ORAL | Status: DC

## 2011-01-19 NOTE — Progress Notes (Signed)
OFFICE VISIT  01/20/2011   CC:  Chief Complaint  Patient presents with  . Follow-up    1 month follow up     HPI:    Patient is a 45 y.o. Caucasian female who presents for f/u chronic pain/fibromyalgia. No change in pain, chronic constipation, chronic HAs, and chronic nausea.  She has appt to get her rectal prolapse repaired 02/10/11. Says she's still taking cymbalta and needs RF---she thinks she is minimally improved, if any, on this med (mood, not pain).  She has had one of her febrile episodes since I last saw her on 12/26/10: temp 104 x 24h, n/v x 1d, fatigue x 2-3d like her usual episode of this.  It caused her to miss her 1st appt with her psychiatrist. Also, the last 1 1/2 weeks she has had intermittent right sided CP that is sharp, worse with deep breaths, cough, sneeze, laugh, and valsalva, and is tender to touch.  NO SOB, no wheezing or coughing with this.  No diaphoresis, no focal weakness.  She has been reading about her symptoms on the internet and feels like she has Lupus, despite neg ANA, sed rate normal.   Most convincing symptom being pinkish rash on cheeks and nose, history of oral ulcers.  We reviewed the fact that all of her musculoskeletal pains have never been associated with actual inflammitory type arthritis.  We did decide that since she is already established with a rheumatologist, Dr. Consuella Lose, it would be the best next step to see her again with question of ANA negative lupus--any further w/u rec's.  Past Medical History  Diagnosis Date  . DDD (degenerative disc disease), lumbar   . Headache   . Anxiety   . Fibromyalgia     Dr. Hope Pigeon [phone]  . Interstitial cystitis   . Obesity   . Insomnia   . Transaminasemia 03/2010    (ALT 49), normal on f/u testing off of daily tylenol uuse.  Marland Kitchen GERD (gastroesophageal reflux disease) 06/01/10    Dyspepsia/gastritis, EGD: gastritis (NSAIDS)  . Rectal bleeding 06/01/10    Anorectal  bleeding/BRBPR--colonoscopy: internal hemorrhoids  . Recurrent UTI     renal u/s normal 07/2007  . Weight loss     CT abd/pelf 08/22/10 remakable only for bile duct 9mm (unchanged from 03/2004 PRIOR to ERCP w/spincterotomy, bile duct balloon dredging and lap chole)  . Recurrent fever     W/u unrevealing, including ID consult.    . Asthma   . Osteopenia     bone densitometry 2006 per pt  . 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 Christella Hartigan  . Multinodular goiter 11/2010    TSH normal    Past Surgical History  Procedure Date  . Bunionectomy     right foot  . Abdominal hysterectomy   . Bilateral salpingoophorectomy 2003    endometriosis  . Cholecystectomy 2005    lap--(cholelithiasis w/choledocholithiasis on u/s 2005--PreOperative ERCP showed NO stone or other obstruction in the biliary tract, but CBD messured 9mm near head of pancreas.  S sphincterotomy and bile duct balloon dredging was done at that time.  MRCP 10/2010 NORMAL.  Marland Kitchen Orif metatarsal fracture 2006    Right 2nd and 3rd metatarsals (Dr. Romeo Apple)  . Biopsy thyroid 11/2010    Fine needle: NONmalignant goiter    Outpatient Prescriptions Prior to Visit  Medication Sig Dispense Refill  . albuterol (PROAIR HFA) 108 (90 BASE) MCG/ACT inhaler Inhale 2 puffs into the lungs every 4 (four)  hours as needed.        Marland Kitchen amitriptyline (ELAVIL) 100 MG tablet Take 100 mg by mouth at bedtime.        Marland Kitchen amLODipine (NORVASC) 5 MG tablet Take 1 tablet (5 mg total) by mouth daily.  90 tablet  3  . Ascorbic Acid (VITAMIN C) 500 MG tablet Take 500 mg by mouth daily.        . Calcium Carbonate (CALCIUM 500 PO) Take 1 tablet by mouth daily.        . clonazePAM (KLONOPIN) 1 MG tablet Take 1 tablet (1 mg total) by mouth as directed. Take 1 tab po q8h  90 tablet  2  . esomeprazole (NEXIUM) 40 MG capsule Take 1 capsule (40 mg total) by mouth daily before breakfast.  90 capsule  3  . Fish Oil OIL Take 2 capsules by mouth daily.         . fluconazole (DIFLUCAN) 150 MG tablet 1 tab once daily x 1d, may repeat in 3d if still symptomatic  2 tablet  2  . Fluticasone-Salmeterol (ADVAIR DISKUS) 250-50 MCG/DOSE AEPB Inhale 1 puff into the lungs every 12 (twelve) hours.        . ondansetron (ZOFRAN-ODT) 8 MG disintegrating tablet Take 8 mg by mouth every 8 (eight) hours as needed. For nausea       . phenazopyridine (PYRIDIUM) 200 MG tablet Take 1 tablet (200 mg total) by mouth 3 (three) times daily as needed for pain.  20 tablet  6  . zolpidem (AMBIEN) 10 MG tablet Take 10 mg by mouth at bedtime as needed.        . carisoprodol (SOMA) 350 MG tablet Take 350 mg by mouth 3 (three) times daily as needed.        . DULoxetine (CYMBALTA) 60 MG capsule Take 1 capsule (60 mg total) by mouth daily.  30 capsule  5  . oxyCODONE (OXY IR/ROXICODONE) 5 MG immediate release tablet Take 1-2 tablets by mouth every 4 hours as needed for breakthrough pain  300 tablet  0  . Oxycodone HCl (OXYCONTIN) 60 MG TB12 Take 1 tablet (60 mg total) by mouth 2 (two) times daily. Pain contract in chart  60 each  0  . promethazine (PHENERGAN) 25 MG tablet Take 1-2 tablets every 6 hours as needed for nausea  90 tablet  0    No Known Allergies  ROS As per HPI  PE: Blood pressure 146/102, pulse 104, temperature 97.6 F (36.4 C), temperature source Oral, height 5' 4.5" (1.638 m), weight 186 lb 6.4 oz (84.55 kg), SpO2 98.00%. Gen: Alert, well appearing.  Patient is oriented to person, place, time, and situation. Face: medial cheeks and nose with pinkish macular rash, nonpalpable.  No warmth or tenderness.  Eyes without injection. Nose clear.  Oropharynx moist/pink and without focal lesion. Neck: no LAD.  Thyroid gland palpable and diffusely enlarged, nontender. Chest: symmetric expansion, nonlabored respirations.  Clear and equal breath sounds in all lung fields.   CV: Regular, mild tachycardia to 110, no m/r/g.  Peripheral pulses 2+ and symmetric.  Mild left sided  (upper) chest wall TTP.  No rash or erythema. EXT: no clubbing, cyanosis, or edema.  No joint swelling, erythema, stiffness, or tenderness.  Mild diffuse muscle tenderness.  LABS:  none  IMPRESSION AND PLAN:  Rash Malar rash: combined with her multitude of musculoskeletal and systemic complaints, this objective finding does make me reconsider SLE. Lab w/u and physical exam in the  past has not supported this diagnosis in her. We decided to get her back in with her rheumatologist, Dr. Consuella Lose, as soon as possible to get her opinion/rec's on further w/u and management.    Multinodular goiter Euthyroid in the past. Will monitor.  Chest pain Her current chest pain is musculoskeletal. Reassured.  I recommended heat or ice application prn---I don't recommend she treat this with any specific medication.  Partial rectal prolapse She is set up for surgical repair 02/10/11.   Pain control post-op will depend on the extent of the surgery and I'll leave that to the surgeon---this was made clear today with her. After the immediate post op period I'll resume mgmt of this as per our pain contract.  DEPRESSION Missed initial appt 01/03/11 and she'll have to reschedule. Continue 60mg  cymbalta qd.     FOLLOW UP: Return in about 1 month (around 02/18/2011) for f/u chronic pain.  Please also make appt for her at Dr. Daron Offer office --first opening she has.

## 2011-01-20 ENCOUNTER — Telehealth: Payer: Self-pay | Admitting: Family Medicine

## 2011-01-20 NOTE — Assessment & Plan Note (Signed)
Euthyroid in the past. Will monitor.

## 2011-01-20 NOTE — Assessment & Plan Note (Signed)
Malar rash: combined with her multitude of musculoskeletal and systemic complaints, this objective finding does make me reconsider SLE. Lab w/u and physical exam in the past has not supported this diagnosis in her. We decided to get her back in with her rheumatologist, Dr. Consuella Lose, as soon as possible to get her opinion/rec's on further w/u and management.

## 2011-01-20 NOTE — Telephone Encounter (Signed)
Please send both of Dr. Moshe Cipro notes (Infectious disease), all of my notes from march, April, and may this year, and all labs and radiology from march, April, and may to Dr. Consuella Lose at [facility].  She is a mutual patient and has f/u with Devosh soon.  Thanks!--PM

## 2011-01-20 NOTE — Assessment & Plan Note (Signed)
Her current chest pain is musculoskeletal. Reassured.  I recommended heat or ice application prn---I don't recommend she treat this with any specific medication.

## 2011-01-20 NOTE — Assessment & Plan Note (Signed)
Missed initial appt 01/03/11 and she'll have to reschedule. Continue 60mg  cymbalta qd.

## 2011-01-20 NOTE — Assessment & Plan Note (Addendum)
She is set up for surgical repair 02/10/11.   Pain control post-op will depend on the extent of the surgery and I'll leave that to the surgeon---this was made clear today with her. After the immediate post op period I'll resume mgmt of this as per our pain contract.

## 2011-01-22 NOTE — Telephone Encounter (Signed)
Faxed 10/26/10, 3/28, 4/12, 4/23, 5/22, 6/15 & Dr Hatcher's OV notes. Faxed 3/2/2 MRI, 3/15 Chest, 4/5 Chest Thyroid & biopsy. Labs 3/15, 4/5,  4/12, 4/13, 4/23, 5/22

## 2011-01-26 ENCOUNTER — Other Ambulatory Visit (HOSPITAL_COMMUNITY): Payer: 59

## 2011-01-26 ENCOUNTER — Ambulatory Visit: Payer: 59 | Admitting: Family Medicine

## 2011-01-26 ENCOUNTER — Encounter: Payer: Self-pay | Admitting: Family Medicine

## 2011-01-30 ENCOUNTER — Other Ambulatory Visit (INDEPENDENT_AMBULATORY_CARE_PROVIDER_SITE_OTHER): Payer: Self-pay | Admitting: Surgery

## 2011-01-30 ENCOUNTER — Encounter (HOSPITAL_COMMUNITY): Payer: 59

## 2011-01-30 LAB — SURGICAL PCR SCREEN
MRSA, PCR: NEGATIVE
Staphylococcus aureus: POSITIVE — AB

## 2011-01-30 MED ORDER — POLYETHYLENE GLYCOL 3350 17 GM/SCOOP PO POWD: 255.0000 g | Freq: Once | ORAL | Status: DC

## 2011-01-31 ENCOUNTER — Telehealth: Payer: Self-pay | Admitting: Family Medicine

## 2011-01-31 NOTE — Telephone Encounter (Signed)
Pt will need refills on oxycontin and oxycodone while you are on vacation.  Pt is calling for RX to be written prior to you leaving.  Pt will be having surgery on 02/08/11 for rectocele repair, and would like to pick up prior to that as she will not be able to drive.

## 2011-01-31 NOTE — Telephone Encounter (Signed)
Patient will need to pick up meds either 7/13 or 02/19/11 or can we mail them to her?

## 2011-02-01 ENCOUNTER — Other Ambulatory Visit: Payer: Self-pay | Admitting: Family Medicine

## 2011-02-01 DIAGNOSIS — M797 Fibromyalgia: Secondary | ICD-10-CM

## 2011-02-01 MED ORDER — OXYCODONE HCL 5 MG PO TABS: ORAL_TABLET | ORAL | Status: DC

## 2011-02-01 MED ORDER — OXYCODONE HCL 60 MG PO TB12: 1.0000 | ORAL_TABLET | Freq: Two times a day (BID) | ORAL | Status: DC

## 2011-02-01 NOTE — Telephone Encounter (Signed)
RX left at front desk.  Pt notified.

## 2011-02-01 NOTE — Telephone Encounter (Signed)
Rx's printed.--PM

## 2011-02-04 DIAGNOSIS — K623 Rectal prolapse: Secondary | ICD-10-CM

## 2011-02-04 HISTORY — DX: Rectal prolapse: K62.3

## 2011-02-06 ENCOUNTER — Encounter (INDEPENDENT_AMBULATORY_CARE_PROVIDER_SITE_OTHER): Payer: 59 | Admitting: Surgery

## 2011-02-08 ENCOUNTER — Other Ambulatory Visit (INDEPENDENT_AMBULATORY_CARE_PROVIDER_SITE_OTHER): Payer: Self-pay | Admitting: Surgery

## 2011-02-08 ENCOUNTER — Inpatient Hospital Stay (HOSPITAL_COMMUNITY)
Admission: RE | Admit: 2011-02-08 | Discharge: 2011-02-12 | DRG: 331 | Disposition: A | Discharge status: Home / Self Care | Payer: 59 | Source: Ambulatory Visit | Attending: Surgery | Admitting: Surgery

## 2011-02-08 DIAGNOSIS — K219 Gastro-esophageal reflux disease without esophagitis: Secondary | ICD-10-CM | POA: Diagnosis present

## 2011-02-08 DIAGNOSIS — IMO0001 Reserved for inherently not codable concepts without codable children: Secondary | ICD-10-CM | POA: Diagnosis present

## 2011-02-08 DIAGNOSIS — M899 Disorder of bone, unspecified: Secondary | ICD-10-CM | POA: Diagnosis present

## 2011-02-08 DIAGNOSIS — K623 Rectal prolapse: Secondary | ICD-10-CM

## 2011-02-08 DIAGNOSIS — E669 Obesity, unspecified: Secondary | ICD-10-CM | POA: Diagnosis present

## 2011-02-08 DIAGNOSIS — J45909 Unspecified asthma, uncomplicated: Secondary | ICD-10-CM | POA: Diagnosis present

## 2011-02-08 DIAGNOSIS — N993 Prolapse of vaginal vault after hysterectomy: Secondary | ICD-10-CM | POA: Diagnosis present

## 2011-02-08 DIAGNOSIS — IMO0002 Reserved for concepts with insufficient information to code with codable children: Secondary | ICD-10-CM | POA: Diagnosis present

## 2011-02-08 DIAGNOSIS — R51 Headache: Secondary | ICD-10-CM | POA: Diagnosis present

## 2011-02-08 DIAGNOSIS — E041 Nontoxic single thyroid nodule: Secondary | ICD-10-CM | POA: Diagnosis present

## 2011-02-08 DIAGNOSIS — Z01812 Encounter for preprocedural laboratory examination: Secondary | ICD-10-CM

## 2011-02-08 LAB — TYPE AND SCREEN
ABO/RH(D): O POS
Antibody Screen: NEGATIVE

## 2011-02-08 LAB — ABO/RH: ABO/RH(D): O POS

## 2011-02-09 LAB — CBC
Hemoglobin: 9.5 g/dL — ABNORMAL LOW (ref 12.0–15.0)
MCH: 27.9 pg (ref 26.0–34.0)
MCHC: 33.9 g/dL (ref 30.0–36.0)
RDW: 14.7 % (ref 11.5–15.5)

## 2011-02-09 LAB — CREATININE, SERUM
Creatinine, Ser: 0.76 mg/dL (ref 0.50–1.10)
GFR calc Af Amer: >60 mL/min (ref >60)
GFR calc non Af Amer: >60 mL/min (ref >60)

## 2011-02-10 LAB — CREATININE, SERUM: Creatinine, Ser: 0.64 mg/dL (ref 0.50–1.10)

## 2011-02-10 LAB — POTASSIUM: Potassium: 3.6 mEq/L (ref 3.5–5.1)

## 2011-02-10 LAB — CBC
MCH: 27.2 pg (ref 26.0–34.0)
MCV: 83.7 fL (ref 78.0–100.0)
Platelets: 214 10*3/uL (ref 150–400)
RBC: 3.31 MIL/uL — ABNORMAL LOW (ref 3.87–5.11)
RDW: 15 % (ref 11.5–15.5)

## 2011-02-14 ENCOUNTER — Encounter (INDEPENDENT_AMBULATORY_CARE_PROVIDER_SITE_OTHER): Payer: 59 | Admitting: Surgery

## 2011-02-19 ENCOUNTER — Encounter (INDEPENDENT_AMBULATORY_CARE_PROVIDER_SITE_OTHER): Payer: Self-pay | Admitting: Surgery

## 2011-02-19 ENCOUNTER — Ambulatory Visit (INDEPENDENT_AMBULATORY_CARE_PROVIDER_SITE_OTHER): Payer: Commercial Managed Care - PPO | Admitting: Surgery

## 2011-02-19 DIAGNOSIS — K623 Rectal prolapse: Secondary | ICD-10-CM

## 2011-02-19 MED ORDER — HYDROMORPHONE HCL 4 MG PO TABS: 4.0000 mg | ORAL_TABLET | Freq: Four times a day (QID) | ORAL | Status: DC | PRN

## 2011-02-19 NOTE — Progress Notes (Signed)
Subjective:     Patient ID: Alexandra Reid, female   DOB: 08-Nov-1965, 45 y.o.   MRN: [id number]  HPI  The patient comes in feeling okay. She is now postoperative day #11.  She ran out of MiraLax. He has been feeling little bit constipated. She is due to refill the miralax. No diarrhea. She ran out of dilaudid. She was hoping for more. She is not taking any nonsteroidals with that. She has used ibuprofen the past that is been helpful. She claims she's had liver problems with Tylenol.  She is urinating okay. No fevers chills or sweats. No nausea or vomiting. She is eating pretty well.  Review of Systems  Constitutional: Negative for fever, chills, diaphoresis, appetite change and fatigue.  HENT: Negative for nosebleeds, sore throat, mouth sores, neck pain and neck stiffness.   Eyes: Negative for photophobia, discharge and visual disturbance.  Respiratory: Negative for cough, choking, chest tightness and shortness of breath.   Cardiovascular: Negative for chest pain and palpitations.  Gastrointestinal: Negative for nausea, vomiting, diarrhea, constipation, blood in stool, abdominal distention, anal bleeding and rectal pain.       Suprapubic soreness  Genitourinary: Negative for dysuria, frequency, flank pain, vaginal bleeding, vaginal discharge and difficulty urinating.  Musculoskeletal: Negative for back pain, arthralgias and gait problem.  Skin: Negative for color change, pallor and rash.  Neurological: Negative for dizziness, speech difficulty, weakness and numbness.  Hematological: Negative for adenopathy. Does not bruise/bleed easily.  Psychiatric/Behavioral: Negative for confusion and agitation. The patient is not nervous/anxious.        Objective:   Physical Exam  Constitutional: She is oriented to person, place, and time. She appears well-developed and well-nourished. No distress.  HENT:  Head: Normocephalic.  Mouth/Throat: Oropharynx is clear and moist. No oropharyngeal  exudate.  Eyes: Conjunctivae and EOM are normal. Pupils are equal, round, and reactive to light. No scleral icterus.  Neck: Normal range of motion. Neck supple. No tracheal deviation present.  Cardiovascular: Normal rate, regular rhythm and intact distal pulses.   Pulmonary/Chest: Effort normal and breath sounds normal. No respiratory distress. She exhibits no tenderness.  Abdominal: Soft. She exhibits no distension and no mass. There is no rebound and no guarding. Hernia confirmed negative in the right inguinal area and confirmed negative in the left inguinal area.       Suprapubic incision sore.  1cm opening, <1cm deep No abscess.  Packed w gauze  Genitourinary: No vaginal discharge found.  Musculoskeletal: Normal range of motion. She exhibits no tenderness.  Lymphadenopathy:    She has no cervical adenopathy.       Right: No inguinal adenopathy present.       Left: No inguinal adenopathy present.  Neurological: She is alert and oriented to person, place, and time. No cranial nerve deficit. She exhibits normal muscle tone. Coordination normal.  Skin: Skin is warm and dry. No rash noted. She is not diaphoretic. No erythema.  Psychiatric: She has a normal mood and affect. Her behavior is normal. Judgment and thought content normal.       Assessment:     Postop day #11 status post sigmoid colectomy and suture/mesh rectopexy for rectal prolapse. Doing quite well overall.    Plan:     I renewed Dilaudid #40. Try to back off to one p.o. q.6 hours. She was hesitant and concerned about that. Then she stated she was trying to wean off the narcotics. I told her to stick with this. I recommended she  start ibuprofen 800 mg p.o. q.i.d. to decrease the need for the narcotic.  Resume MiraLax. Avoid constipation/diarrhea.  Small wound should close down. Okay to pack. Okay to use soap and water. Okay to use Band-Aid.  Return to clinic in 2 weeks. Call sooner if having problems. She felt reassured and  expressed appreciation.

## 2011-02-19 NOTE — Patient Instructions (Signed)
Avoid constipation - keep using miralax  Use ibuprofen / Dilaudid / heat&ice  Together for pain control

## 2011-02-20 NOTE — Discharge Summary (Signed)
Alexandra Reid, Alexandra Reid NO.:  [id number]  MEDICAL RECORD NO.:  [id number]  LOCATION:  1540                         FACILITY:  [facility]  PHYSICIAN:  Ardeth Sportsman, MD     DATE OF BIRTH:  03-Jul-1966  DATE OF ADMISSION:  02/08/2011 DATE OF DISCHARGE:  02/12/2011                              DISCHARGE SUMMARY   PRIMARY CARE PHYSICIAN:  Jeoffrey Massed, MD  GASTROENTEROLOGIST:  Rachael Fee, MD  GYNECOLOGISTS: 1. Lazaro Arms, MD. 2. Tilda Burrow, MD.  PRINCIPAL DIAGNOSIS:  Rectal prolapse with probable vaginal prolapse.  PROCEDURES PERFORMED:  Laparoscopic low anterior resection with rectopexy and sacrovaginopexy on February 08, 2011.  OTHER DIAGNOSES: 1. Degenerative disk disease. 2. Chronic headaches/migraines. 3. Fibromyalgia, followed by Dr. Corliss Skains. 4. Question of interstitial cystitis, though workup not convincing,     will monitor serology. 5. Gastroesophageal reflux disease and gastritis. 6. Recurrent urinary tract infections. 7. Asthma. 8. Osteopenia. 9. Thyroid nodularity, question multinodular goiter. 10.Status post abdominal hysterectomy, bilateral salpingo-oophorectomy     for endometriosis. 11.Status post laparoscopic cholecystectomy and endoscopic retrograde     cholangiopancreatography in 2005.  SUMMARY OF HOSPITAL COURSE:  [name] is a 45 year old obese female with chronic pain, anxiety, and fibromyalgia issues who had developed rectal prolapse and possible vaginal prolapse.  She underwent surgical repair.  Postoperatively, she was placed on IV and oral pain medication, angiolysis.  She began to have return of bowel function with some loose stools for a few days.  She mobilized more aggressively.  She initially had some issues with nausea, but by the time of discharge, was eating well.  She was urinating well on her own.  She was having flatus.  She did not have uncontrolled diarrhea or constipation.  She was weaned  off her IV pain regimen to her normal baseline oral pain medication.  Because she was making these improvements, I felt reasonable her to discharge home with the following instructions. 1. She is to return to clinic to see me in about 2 weeks to make sure     she is continuing to improve. 2. She should call if she has worsening fevers, chills, sweats,     nausea, vomiting, uncontrolled pain, diarrhea, worsening bleeding,     bruising, or drainage from her incision. 3. She should resume her home medications which include OxyContin.  I     did write an extra prescription for oxycodone for her.  She can     have Klonopin p.r.n. anxiety.  She should continue her Advair and     albuterol.  She should continue Phenergan p.r.n.  She should     continue her Soma, Cymbalta, amitriptyline, and Ambien.  She can     consider continuing amlodipine.  Hopefully, she can gradually wean off some of her pain and anxiety medications overtime but I will refer to her primary care physician and chronic pain specialist for this.     Ardeth Sportsman, MD    SCG/MEDQ  D:  02/12/2011  T:  02/12/2011  Job:  [id number]  cc:   Jeoffrey Massed, MD Fax: [phone]  Rachael Fee, MD [phone]  [place], [place] [address]  Lazaro Arms, M.D. Fax: [phone]  Tilda Burrow, M.D. Fax: [phone]  Dr. Corliss Skains  Electronically Signed by Karie Soda MD on 02/20/2011 12:47:38 PM

## 2011-02-20 NOTE — Op Note (Signed)
Alexandra Reid, Alexandra Reid NO.:  [id number]  MEDICAL RECORD NO.:  [id number]  LOCATION:  1540                         FACILITY:  [facility]  PHYSICIAN:  Alexandra Sportsman, MD     DATE OF BIRTH:  1965-12-06  DATE OF PROCEDURE:  02/08/2011 DATE OF DISCHARGE:                              OPERATIVE REPORT   PRIMARY CARE PHYSICIAN:  Alexandra Massed, MD  GASTROENTEROLOGIST:  Alexandra Fee, MD  GYNECOLOGIST:  Alexandra Reid, M.D. and Alexandra Reid, M.D.  SURGEON:  Alexandra Sportsman, MD  ASSISTANT:  RN.  PREOPERATIVE DIAGNOSES: 1. Rectal prolapse. 2. Probable vaginal prolapse.  POSTOPERATIVE DIAGNOSES: 1. Rectal prolapse. 2. Posterior vaginal prolapse.  PROCEDURE PERFORMED: 1. Laparoscopic low anterior resection with stapled anastomosis. 2. Laparoscopic rectopexy with biologic mesh and colposacropexy. 3. Sacrovaginopexy. 4. Rigid proctoscopy.  ANESTHESIA: 1. General anesthesia. 2. Local anesthetic in a field block around all port sites and     extraction site.  SPECIMENS:  Rectosigmoid.  Anastomotic rings with blue stitch proximal ring.  DRAINS:  None.  ESTIMATED BLOOD LOSS:  150 mL.  COMPLICATIONS:  None.  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  She has chronic constipation with occasional bouts of diarrhea.  Question of irritable bowel syndrome.  She has developed prolapse, especially of the anterior rectum with fecal incontinence subsequently with this.  She had a workup that was otherwise negative for any cancer or colitis.  She was sent to me for surgical repair.  She was convinced that she has vaginal prolapse as well and some irritations suspicious for that.  Anatomy and physiology of the pelvis was discussed.  Pathophysiology of prolapse with incontinence  and etc., were discussed.   After discussions, recommendation was made for rectopexy.  Laparoscopic and other approaches were discussed.  Possibility of concomitant partial colectomy was discussed as well.  She was very interested in having her vagina tacked as well if evidence of prolapse.  Techniques, risk, benefits, and alternatives were discussed.  Questions answered and she agreed to proceed.  OPERATIVE FINDINGS:  She had a moderate and redundant sigmoid colon and dilating colon.  She had a low peritoneal reflection with rectovaginal prolapse, primarily anterior.  I saw no evidence of any cancer or tumor.  Anastomosis was 12 cm from the anal verge.  DESCRIPTION OF PROCEDURE:  Informed procedure was confirmed.  The patient has received bowel prep.  She had sequential compression devices active during the entire case.  She underwent general anesthesia without any difficulty.  She received IV cefoxitin prior to incision.  She was positioned in low lithotomy down with Reid tucked.  Her abdomen and perineum were clipped, prepped, and draped in a sterile fashion.  Foley catheter was sterilely placed.  Surgical time-out confirmed our plan.  I placed a #5 mm port in the left upper quadrant using optical entry technique with the patient in the steep reverse Trendelenburg and right side up.  Entry was clean.  Camera inspection revealed no  injury.  I induced carbon dioxide insufflation.  Under direct visualization, I placed a 10 mm port supraumbilical into the prior incision.  I placed a 5 mm port in the right, left, and midabdomen.  I turned my attention towards the lower abdomen.  I positioned the patient's head down right side down and reflected the greater omentum up over the liver and the small bowel and pelvis.  I could see some redundant sigmoid curving right down into the pelvis.  She had a very low peritoneal reflection.  Her vagina and her rectum seemed to be stretched down into the pelvis.  There I began  mobilizing the rectosigmoid.  I scored the sigmoid mesentery down to the right peritoneal reflection posteriorly.  I got into the retromesenteric vein and elevated that until I got into the mesorectum.  I elevated that anteriorly and came down the vascular plane between the sacrum and the mesorectum.  I followed that down to the coccyx using some focus blunt as well as focus sharp dissection.  I freed all posterior ligaments, etc.  I tired to preserve the mid, distal, rectal lateral pedicles.  The proximal pedicle already had taken little bit just to help reorient. Posterior sacral nerves were preserved at all times.  The ureter and gonadal vessels, especially on the left side preserved those as well. But that was able to come around the left peritoneal reflection and freed a little bit of that without taking the lateral pedicles.  The sigmoid had a few attachments to it, but she had a redundant sigmoid in the distal sigmoid and the proximal sigmoid colon along with the descending colon.  I felt there was little too much to after pexing when I pulled it up, it looked like it easily volvulized, therefore, I decided to proceed with concomitant resection.  I elevated the sigmoid mesentery and mobilized the sigmoid colon and distal ascending colon in the medial and lateral fashion.  She had some good strong lateral adhesions in her proximal descending colon and splenic flexure, so I used those as a natural tacking point to get a sense of where to resect.  With the rectosigmoid mobilization, I could help to get some of the rectum out of the pelvis.  I focussed on mobilization between the anterior rectum and the vagina. There was some old scarring from her prior hysterectomy and salpingo- oophorectomy.  The planes were not as clean to see, but I carefully came around the peritoneal reflection, right lateral, and left lateral until I came anterior as well.  I was in the side of taking the  peritoneal reflection more proximally and not as a whole.  I could separate the peritoneal reflection off the anterior rectal wall and came around it circumferentially.  With that, I took the cusp of the peritoneal reflection and elevated that cephalad and freed the anterior rectum off the posterior vaginal wall quite distally.  I was able to free anteriorly and somewhat laterally as well and staying away from the lateral pedicles on the rectum.  With that, I can bring up the vaginal cuff and the peritoneal reflection quite well.  We had good mobilization.  With that, I had excellent mobilization in the rectum preserving the lateral pedicles.  I was able to pull the rectum, chose a spot between the proximal and mid rectum as it was a little bit stretched out and transected that using a laparoscopic stapler.  I did that after placing a GelPort into a 5 cm Pfannenstiel incision as a wound protector, but allowed  laparoscopic dissection to continue.  I then used a 60 Echelon to help transect around the rectum and took the mesorectum as well.  I found a point in the distal descending colon.  I reached down to this rectal stump quite well with some natural tension and holding down from the splenic flexure and proximal descending colon.  Rectal stump pullup and that seemed to be a natural area.  I elevated it cephalad and ligated the mesentery at the distal descending colon preserving the left colic branch.  I took that radially.  I ensured hemostasis in the pelvis.  I eviscerated the rectosigmoid out the Pfannenstiel wound.  I found the spot of the distal descending colon.  I clamped proximal and transected the distal colon off this point.  It had good bleeding at the mesentery just entering in the distal mucosa had good bleeding as well and following good viability.  I sized the 33 size easily through.  I ran a 0-Vicryl purse- string stitch around and placed a 33 EEA anvil into the  proximal descending colon and tied that down.  I returned that into the abdomen.  I did laparoscopy, ensured hemostasis.  The anvil could easily reach down distal to the pelvic brim down over the sacral promontory to reach the rectal stump.  I went down and was able to place 33 EEA stapler up the rectum.  I could see coming up to rectal stump and brought the spike out the mid part of the staple line.  We attached the anvil on to the stapler and brought the anvil down to the rectum.  I held the stapler clamp for 60 seconds. I fired it for 30 and held the clamp for 30 seconds.  I released, removed the donuts, and had 2 excellent anastomotic rings.  I did a rigid proctoscopy.  There was a little bit of blood at the anastomosis, but after inspecting irrigating hemostasis was excellent.  The anastomosis was 12 cm from the anal verge.  I changed gloves and went back in cephalad.  I did some irrigation. There was a bleeder in the very low pelvis near the sacrococcygeal region.  I isolated it and found to control it with cautery.  It seemed too distal to be one of the sacral veins.  I lost about 15 mL there and I sourced most of the bleeding through the case.  I got good hemostasis at the end.  I used the MTF biologic mesh and cut it to 8 x 12 cm.  I placed some 2-0 PDS interrupted stitches x3 on each edge.  I placed it down into the pelvis.  I used a spiral tack to tack the biologic mesh to the sacrum along its midline  and somewhat paramedian as well.  I held it in place. I brought the stitches out and used stitches from the lateral sides of the biologic mesh to the anterolateral rectum taking good seromuscular bite.  This allowed the mesh to wrap around by 240 degrees in the rectum leaving the anterior rectum open.  There were 6 interrupted stitches holding in.  I did this with holding the rectum up and holding it in place.  I did do a stitch between the rectum and descending colon  and down to the sacral fascia staying away from the nerves on the ureter on the right side and tied that down to get a good primary pexy as well. The anastomosis rested well and it was snugged, but overly  tight.  There was no redundancy left.  I did copious irrigation with clear return.  I brought the cuff and did a pexy of the vagina to the right sacral promontory periosteal fascia using a 0 Prolene horizontal mattress stitch.  I allowed the peritoneal reflection and some epiploic appendages there buttress in between to avoid any cutting effect on the anterior rectal wall.  Vagina nearly came all the way down to it as well to help stretch it.  That provided a good pexy of the vagina to the sacrum.  I did copious irrigation with clear return.  I evacuated the capnoperitoneum and removed the ports.  I closed the supraumbilical fascial defect using the 0-Vicryl stitch.  I closed the Pfannenstiel using 0-Vicryl and the posterior rectus fascia with #1 vertically and then #1 PDS on the anterior fascia horizontally.  I closed the skin using some 4-0 Monocryl stitch and placed a few wicks in between the Pfannenstiel incision.  The patient was extubated and sent to recovery room in stable condition. I discussed the postoperative care with the patient.  Preop, I will discuss with the family again now.     Alexandra Sportsman, MD     SCG/MEDQ  D:  02/08/2011  T:  02/09/2011  Job:  [id number]  cc:   Alexandra Massed, MD Fax: [phone]  Alexandra Fee, MD [address] [place], [place] [address]  Alexandra Reid, M.D. Fax: [phone]  Alexandra Reid, M.D. Fax: [phone]  Electronically Signed by Karie Soda MD on 02/20/2011 12:47:48 PM

## 2011-02-26 ENCOUNTER — Ambulatory Visit: Payer: 59 | Admitting: Family Medicine

## 2011-02-28 ENCOUNTER — Telehealth (INDEPENDENT_AMBULATORY_CARE_PROVIDER_SITE_OTHER): Payer: Self-pay

## 2011-02-28 NOTE — Telephone Encounter (Signed)
Patient called in and said incision site has an open area with more than normal drainage coming out and thinks there is a smell coming from area. I told patient Dr Michaell Cowing wasn't here the rest the week and that we have an urge appointment. She said she would talk to her husband to see if he could bring her and would call back to make that appointment if so.

## 2011-03-02 ENCOUNTER — Ambulatory Visit (INDEPENDENT_AMBULATORY_CARE_PROVIDER_SITE_OTHER): Payer: Commercial Managed Care - PPO | Admitting: General Surgery

## 2011-03-02 ENCOUNTER — Encounter (INDEPENDENT_AMBULATORY_CARE_PROVIDER_SITE_OTHER): Payer: Self-pay | Admitting: General Surgery

## 2011-03-02 DIAGNOSIS — R109 Unspecified abdominal pain: Secondary | ICD-10-CM

## 2011-03-02 LAB — CBC WITH DIFFERENTIAL/PLATELET
Basophils Absolute: 0 10*3/uL (ref 0.0–0.1)
Basophils Relative: 1 % (ref 0–1)
Eosinophils Absolute: 0.1 10*3/uL (ref 0.0–0.7)
Eosinophils Relative: 2 % (ref 0–5)
HCT: 30.2 % — ABNORMAL LOW (ref 36.0–46.0)
Hemoglobin: 9.6 g/dL — ABNORMAL LOW (ref 12.0–15.0)
MCH: 25.9 pg — ABNORMAL LOW (ref 26.0–34.0)
MCHC: 31.8 g/dL (ref 30.0–36.0)
MCV: 81.4 fL (ref 78.0–100.0)
Monocytes Absolute: 0.4 10*3/uL (ref 0.1–1.0)
Monocytes Relative: 7 % (ref 3–12)
Neutro Abs: 4.2 10*3/uL (ref 1.7–7.7)
RDW: 15.2 % (ref 11.5–15.5)

## 2011-03-02 MED ORDER — HYDROMORPHONE HCL 2 MG PO TABS: 2.0000 mg | ORAL_TABLET | Freq: Four times a day (QID) | ORAL | Status: DC | PRN

## 2011-03-02 NOTE — Progress Notes (Signed)
Subjective:     Patient ID: Alexandra Reid, female   DOB: 1966/01/09, 45 y.o.   MRN: [id number]  HPI This patient underwent sigmoid colectomy and proctopexy by Dr. gross. This was done recently. She comes in today for a wound check. She says she's had some drainage from her wound but it is serosanguineous and she does not really smell any odor. She also says that she is having increasing abdominal pain the last 5 days and would like a refill on Dilaudid. She is eating normally. Her bowels are moving.  Review of Systems     Objective:   Physical Exam She is alert and in no distress. Temp is 98.5. She does not look toxic or ill. Abdomen is soft and flat. All of the incisions look fine. The transverse incision in the suprapubic area shows a some separation of the skin but there is no drainage, no erythema, no purulence, no hernia, and it is not really that tender. I probed the wound with a hemostat and there were no undrained fluid collections. The wound was redressed.    Assessment:     Postop abdominal pain and wound drainage. I suspect that her pain is simply incisional pain and will be self-limited. I do not think she has a wound infection.   Plan:     CBC will be obtained.  She is encouraged to stay hydrated and eat healthy food.  At her request, I gave her Dilaudid, 2 mg, 30 tablets. Prescription was printed and signed.  We will reschedule her up on to Dr. gross in one week. She wanted to cancel the appointment on July 30.

## 2011-03-02 NOTE — Patient Instructions (Signed)
Your lower abdominal wound does not look infected. We will schedule you for a CBC to make sure you're white blood count is not elevated. If it is normal, then I think that your abdominal pain is simply due to your surgery. If the white blood cell count is elevated, we will probably schedule you for a CT scan. Please return to see Dr. Michaell Cowing at the scheduled time.You are given a refill for Dilaudid, 2 mg tablets, 30 tablets. Please try to taper off the amount of narcotic that you take.

## 2011-03-06 ENCOUNTER — Encounter (INDEPENDENT_AMBULATORY_CARE_PROVIDER_SITE_OTHER): Payer: Commercial Managed Care - PPO | Admitting: Surgery

## 2011-03-12 ENCOUNTER — Ambulatory Visit: Payer: 59 | Admitting: Adult Health

## 2011-03-12 ENCOUNTER — Encounter: Payer: Self-pay | Admitting: Adult Health

## 2011-03-14 ENCOUNTER — Ambulatory Visit: Payer: 59 | Admitting: Adult Health

## 2011-03-19 ENCOUNTER — Encounter: Payer: Self-pay | Admitting: Family Medicine

## 2011-03-20 ENCOUNTER — Telehealth: Payer: Self-pay | Admitting: Family Medicine

## 2011-03-20 ENCOUNTER — Ambulatory Visit: Payer: 59 | Admitting: Family Medicine

## 2011-03-20 ENCOUNTER — Encounter: Payer: Self-pay | Admitting: Family Medicine

## 2011-03-20 ENCOUNTER — Ambulatory Visit (INDEPENDENT_AMBULATORY_CARE_PROVIDER_SITE_OTHER): Payer: 59 | Admitting: Family Medicine

## 2011-03-20 DIAGNOSIS — D649 Anemia, unspecified: Secondary | ICD-10-CM

## 2011-03-20 DIAGNOSIS — IMO0001 Reserved for inherently not codable concepts without codable children: Secondary | ICD-10-CM

## 2011-03-20 DIAGNOSIS — M797 Fibromyalgia: Secondary | ICD-10-CM

## 2011-03-20 DIAGNOSIS — R5383 Other fatigue: Secondary | ICD-10-CM

## 2011-03-20 DIAGNOSIS — R1013 Epigastric pain: Secondary | ICD-10-CM

## 2011-03-20 DIAGNOSIS — A689 Relapsing fever, unspecified: Secondary | ICD-10-CM

## 2011-03-20 LAB — H. PYLORI ANTIBODY, IGG: H Pylori IgG: NEGATIVE

## 2011-03-20 LAB — CBC WITH DIFFERENTIAL/PLATELET
Basophils Absolute: 0 10*3/uL (ref 0.0–0.1)
Basophils Relative: 0.5 % (ref 0.0–3.0)
Eosinophils Absolute: 0.2 10*3/uL (ref 0.0–0.7)
Lymphocytes Relative: 12.2 % (ref 12.0–46.0)
MCHC: 32.5 g/dL (ref 30.0–36.0)
MCV: 80.7 fl (ref 78.0–100.0)
Monocytes Absolute: 0.3 10*3/uL (ref 0.1–1.0)
Neutrophils Relative %: 79.7 % — ABNORMAL HIGH (ref 43.0–77.0)
RBC: 3.98 Mil/uL (ref 3.87–5.11)
RDW: 17 % — ABNORMAL HIGH (ref 11.5–14.6)

## 2011-03-20 LAB — COMPREHENSIVE METABOLIC PANEL
ALT: 14 U/L (ref 0–35)
AST: 21 U/L (ref 0–37)
Albumin: 4.1 g/dL (ref 3.5–5.2)
Alkaline Phosphatase: 103 U/L (ref 39–117)
BUN: 5 mg/dL — ABNORMAL LOW (ref 6–23)
Creatinine, Ser: 0.7 mg/dL (ref 0.4–1.2)
Potassium: 4.2 mEq/L (ref 3.5–5.1)

## 2011-03-20 MED ORDER — ONDANSETRON 8 MG PO TBDP: 8.0000 mg | ORAL_TABLET | Freq: Three times a day (TID) | ORAL | Status: DC | PRN

## 2011-03-20 MED ORDER — OXYCODONE HCL 60 MG PO TB12: 1.0000 | ORAL_TABLET | Freq: Two times a day (BID) | ORAL | Status: DC

## 2011-03-20 MED ORDER — ESOMEPRAZOLE MAGNESIUM 40 MG PO CPDR: 40.0000 mg | DELAYED_RELEASE_CAPSULE | Freq: Every day | ORAL | Status: DC

## 2011-03-20 MED ORDER — OXYCODONE HCL 5 MG PO TABS: ORAL_TABLET | ORAL | Status: DC

## 2011-03-20 MED ORDER — SUCRALFATE 1 G PO TABS: ORAL_TABLET | ORAL | Status: DC

## 2011-03-20 MED ORDER — CLONAZEPAM 1 MG PO TABS: 1.0000 mg | ORAL_TABLET | ORAL | Status: DC

## 2011-03-20 MED ORDER — PROMETHAZINE HCL 25 MG PO TABS: ORAL_TABLET | ORAL | Status: DC

## 2011-03-20 NOTE — Assessment & Plan Note (Signed)
Wt. Loss has been ongoing as well, with recent surgery complicating this. Still no explanation for her recurrent fevers + wt loss. Plan is to keep vigilant for new symptoms, monitor labs periodically (CMET, ESR, TSH today).

## 2011-03-20 NOTE — Telephone Encounter (Signed)
Please add ferritin and IBC panel to labs from today.  Dx is [phone].

## 2011-03-20 NOTE — Progress Notes (Signed)
OFFICE VISIT  03/20/2011   CC:  Chief Complaint  Patient presents with  . Follow-up     HPI:    Patient is a 45 y.o. Caucasian female who presents for 72mo f/u of fibromyalgia/chronic pain and recurrent fevers. She got a sigmoidectomy and repair of prolapsed rectum by Dr. Michaell Cowing on 02/08/11.  Post-op course was relatively benign, pain managed with some extra dilaudid rx'd by surgeons but she says she is no longer on this med (just taking her usual chronic pain meds).  Chronic musculoskeletal pain unchanged since last visit, anywhere from 10/10 to 4/10 intensity.  Describes every body part as hurting, but focus of pain much of the time is in both hips and both knees.  Denies swelling or redness of any joints.   Saw Dr. Consuella Lose since my last visit w/her and says that she did not think she had Lupus.  She also said that any further hip injections that Alvina gets will need to be done in hosp radiology setting for u/s or CT fluoroscopic guidance.  Mayeli reports still having periodic episodes (q10-12 days) of temp to 103, with worsening of her chronic fatigue and nausea for 1-2d, followed by generalized fatigue and malaise for 1-2 more days.  Extensive w/u has been unrevealing.  The only new feature to the episodes that she reports is that the most recent one came only 5 days after the prior, instead of 10-12 days between episodes.  She has ongoing anxiety and depression, admits she needs to see a psychiatrist like I've been recommending for many months, says she has set up an appt with Dr. Kathline Magic in [place], [place] (psychiatrist) in 2 days.    Also has f/u appt 03/27/11 with her cardiologist for routine f/u of her atypical chest pain that is believed to be coronary artery spasm--doing better when consistently taking her amlodipine.  New problem today is her report of daily/constant epigastric pain that started about 2 wks after her recent surgery for prolapsed rectum (+sigmoidectomy).  Worse after activity  sometimes and sometimes worse after eating larger meal.  Her chronic nausea is sometimes worse with this, but no vomiting.  No melena or BRBPR.  Not taking any NSAIDs.    Past Medical History  Diagnosis Date  . DDD (degenerative disc disease), lumbar   . Atypical chest pain     Question of coronary vasospasm; cath 10/20/10 NORMAL  . Anxiety   . Fibromyalgia     Dr. Hope Pigeon [phone]  . Interstitial cystitis   . Obesity   . Insomnia   . Transaminasemia 03/2010    (ALT 49), normal on f/u testing off of daily tylenol uuse.  Marland Kitchen GERD (gastroesophageal reflux disease) 06/01/10    Dyspepsia/gastritis, EGD: gastritis (NSAIDS)  . Rectal bleeding 06/01/10    Anorectal bleeding/BRBPR--colonoscopy: internal hemorrhoids  . Recurrent UTI     renal u/s normal 07/2007  . Weight loss     CT abd/pelf 08/22/10 remakable only for bile duct 9mm (unchanged from 03/2004 PRIOR to ERCP w/spincterotomy, bile duct balloon dredging and lap chole)  . Recurrent fever     W/u unrevealing, including ID consult.    . Asthma   . Osteopenia     bone densitometry 2006 per pt  . 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 Christella Hartigan  . Multinodular goiter 11/2010    TSH normal  . Chest pain   . Rectal prolapse 02/2011    Past Surgical History  Procedure Date  .  Bunionectomy     right foot  . Abdominal hysterectomy   . Bilateral salpingoophorectomy 2003    endometriosis  . Cholecystectomy 2005    lap--(cholelithiasis w/choledocholithiasis on u/s 2005--PreOperative ERCP showed NO stone or other obstruction in the biliary tract, but CBD messured 9mm near head of pancreas.  S sphincterotomy and bile duct balloon dredging was done at that time.  MRCP 10/2010 NORMAL.  Marland Kitchen Orif metatarsal fracture 2006    Right 2nd and 3rd metatarsals (Dr. Romeo Apple)  . Biopsy thyroid 11/2010    Fine needle: NONmalignant goiter  . Rectal prolapse repair, rectopexy 05July2012    with sigmoid colectomy     Outpatient Prescriptions Prior to Visit  Medication Sig Dispense Refill  . albuterol (PROAIR HFA) 108 (90 BASE) MCG/ACT inhaler Inhale 2 puffs into the lungs every 4 (four) hours as needed.        Marland Kitchen amitriptyline (ELAVIL) 100 MG tablet Take 100 mg by mouth at bedtime.        Marland Kitchen amLODipine (NORVASC) 5 MG tablet Take 1 tablet (5 mg total) by mouth daily.  90 tablet  3  . Ascorbic Acid (VITAMIN C) 500 MG tablet Take 500 mg by mouth daily.        . Calcium Carbonate (CALCIUM 500 PO) Take 1 tablet by mouth daily.        . carisoprodol (SOMA) 250 MG tablet 1 tab po tid prn  270 tablet  0  . DULoxetine (CYMBALTA) 60 MG capsule Take 1 capsule (60 mg total) by mouth daily.  90 capsule  5  . Fish Oil OIL Take 2 capsules by mouth daily.        . Fluticasone-Salmeterol (ADVAIR DISKUS) 250-50 MCG/DOSE AEPB Inhale 1 puff into the lungs every 12 (twelve) hours.        . polyethylene glycol (MIRALAX / GLYCOLAX) packet Take 17 g by mouth daily.        Marland Kitchen zolpidem (AMBIEN) 10 MG tablet Take 10 mg by mouth at bedtime as needed.        . clonazePAM (KLONOPIN) 1 MG tablet Take 1 tablet (1 mg total) by mouth as directed. Take 1 tab po q8h  90 tablet  2  . esomeprazole (NEXIUM) 40 MG capsule Take 1 capsule (40 mg total) by mouth daily before breakfast.  90 capsule  3  . ondansetron (ZOFRAN-ODT) 8 MG disintegrating tablet Take 8 mg by mouth every 8 (eight) hours as needed. For nausea       . oxyCODONE (OXY IR/ROXICODONE) 5 MG immediate release tablet Take 1-2 tablets by mouth every 4 hours as needed for breakthrough pain  300 tablet  0  . Oxycodone HCl (OXYCONTIN) 60 MG TB12 Take 1 tablet (60 mg total) by mouth 2 (two) times daily. Pain contract in chart  60 each  0  . promethazine (PHENERGAN) 25 MG tablet Take 1-2 tablets every 6 hours as needed for nausea  540 tablet  0  . fluconazole (DIFLUCAN) 150 MG tablet 1 tab once daily x 1d, may repeat in 3d if still symptomatic  2 tablet  2  . HYDROmorphone (DILAUDID) 2 MG  tablet Take 1 tablet (2 mg total) by mouth every 6 (six) hours as needed for pain.  30 tablet  0  . phenazopyridine (PYRIDIUM) 200 MG tablet Take 1 tablet (200 mg total) by mouth 3 (three) times daily as needed for pain.  20 tablet  6    No Known Allergies  ROS As  per HPI  PE: Blood pressure 112/75, pulse 110, height 5' 4.5" (1.638 m), weight 180 lb (81.647 kg), SpO2 100.00%. Gen: Alert, well appearing.   Patient is oriented to person, place, time, and situation.  Affect was pleasant.  Only cried one time (when admitting to me that she needs to see a psychiatrist) ENT: no icteris, no facial rash or edema. NECK: thyroid gland symmetric and mildly enlarged diffusely.  Nontender.  No LAD. Chest: symmetric expansion, nonlabored respirations.  Clear and equal breath sounds in all lung fields.   CV: mild tachycardia to 110, no m/r/g.  Peripheral pulses 2+ and symmetric. ABD: soft, nondistended.  BS mildly hypoactive throughout.  Diffuse TTP, no mass or HSM.  Several small laparoscopy wounds that are well healed.  LABS:  none  IMPRESSION AND PLAN:  Abdominal pain, epigastric Gastritis has been a problem for her in the past but was believed to be NSAID-induced. No NSAIDs recently.   I will add carafate qid to her PPI qd. Will check H. Pylori ab. Will check CBC to make sure H/H stable.  FEVER, RECURRENT Wt. Loss has been ongoing as well, with recent surgery complicating this. Still no explanation for her recurrent fevers + wt loss. Plan is to keep vigilant for new symptoms, monitor labs periodically (CMET, ESR, TSH today).  FIBROMYALGIA, SEVERE Problem stable.  Continue current medications and diet appropriate for this condition.  We have reviewed our general long term plan for this problem and also reviewed symptoms and signs that should prompt the patient to call or return to the office. Continue prn visits with Dr. Consuella Lose for injections of hip bursae. It seems that Dr. Consuella Lose  agrees that she does not have Lupus, but I will try to get her office notes for complete clarification of her thoughts.   Depression/anxiety: has first appt with psychiatrist in 2d.  She'll also continue taking cymbalta and clonazepam.  FOLLOW UP: Return in about 1 month (around 04/20/2011) for f/u pain and recurrent fevers.

## 2011-03-20 NOTE — Assessment & Plan Note (Signed)
Problem stable.  Continue current medications and diet appropriate for this condition.  We have reviewed our general long term plan for this problem and also reviewed symptoms and signs that should prompt the patient to call or return to the office. Continue prn visits with Dr. Consuella Lose for injections of hip bursae. It seems that Dr. Consuella Lose agrees that she does not have Lupus, but I will try to get her office notes for complete clarification of her thoughts.

## 2011-03-20 NOTE — Assessment & Plan Note (Signed)
Gastritis has been a problem for her in the past but was believed to be NSAID-induced. No NSAIDs recently.   I will add carafate qid to her PPI qd. Will check H. Pylori ab. Will check CBC to make sure H/H stable.

## 2011-03-20 NOTE — Telephone Encounter (Signed)
Add on order faxed to [facility]

## 2011-03-20 NOTE — Telephone Encounter (Signed)
Patient would like to have her iron levels checked from today's blood draw if it's not too late, if it is she would like to have it checked the next time she has a lab visit

## 2011-03-21 LAB — HELICOBACTER PYLORI  ANTIBODY, IGM: Helicobacter pylori, IgM: 34.4 U/mL — ABNORMAL HIGH (ref <9.0)

## 2011-03-21 LAB — IBC PANEL
Iron: 17 ug/dL — ABNORMAL LOW (ref 42–145)
Transferrin: 285.1 mg/dL (ref 212.0–360.0)

## 2011-03-21 LAB — FERRITIN: Ferritin: 17.8 ng/mL (ref 10.0–291.0)

## 2011-03-22 ENCOUNTER — Encounter (INDEPENDENT_AMBULATORY_CARE_PROVIDER_SITE_OTHER): Payer: Commercial Managed Care - PPO | Admitting: Surgery

## 2011-03-22 ENCOUNTER — Telehealth: Payer: Self-pay | Admitting: Family Medicine

## 2011-03-22 ENCOUNTER — Other Ambulatory Visit: Payer: Self-pay | Admitting: Family Medicine

## 2011-03-22 DIAGNOSIS — K2970 Gastritis, unspecified, without bleeding: Secondary | ICD-10-CM

## 2011-03-22 NOTE — Telephone Encounter (Signed)
I have attempted to contact this patient by phone with the following results: no answer on home phone.

## 2011-03-22 NOTE — Telephone Encounter (Signed)
Addendum to result note I sent 03/21/11: her other H. Pylori test (IgM) came back positive.  Since her IgG was negative and this one is positive, I want to do a better/confirmatory test to look for H. Pylori--the urea breath test.  I'll order it to be done at [facility] on [address].  I'd like her to stop her PPI and stay off of it for 2 full weeks BEFORE getting the test (to reduce the risk of a false negative test).  If she can't hold off the entire 2 wks, tell her to hold out as long as she can and then take the order up there to get the test.  Thx--PM

## 2011-03-23 ENCOUNTER — Telehealth: Payer: Self-pay | Admitting: Family Medicine

## 2011-03-23 NOTE — Telephone Encounter (Signed)
See 03/22/11 phone note.

## 2011-03-23 NOTE — Telephone Encounter (Signed)
Patient would like to know if her results have come in, she also mentioned that she has still been running a fever

## 2011-03-23 NOTE — Telephone Encounter (Signed)
Ok, please inform her that I am 100% sure she does NOT have hemachromatosis, so tell her not to worry about this ever again.  She needs to take the iron b/c her iron supplies are LOW.  Tell her I'm adamant about this.  Also, she may continue to take carafate and she may take tums prn.  Thank you.--PM

## 2011-03-23 NOTE — Telephone Encounter (Signed)
RC from pt. 1--I advised of low iron and of need for iron supplement.  Pt states that she has one hemachromatosis gene and her ferritin has not been checked "in a very long time" and taking iron supplement would not be good for her.  Ferritin checked on 03/20/11 was normal.  I did not realize this had been checked while on the phone with the patient.  Pt is declining iron until you review this new information.  Pt is agreeable to hemoccult test.  Pt will call back on Monday for answer to above. 2--I advised pt of positive H. Pylori IgM and of need for breath test.  Pt is very hesitant to come off Nexium, but does agree.  Pt will come to the office to pick up hemoccult cards and order for breath test.  Pt does want to know if it is OK to continue sulcralfate and if she is allowed to take Tums in absence of Nexium.  Please advise.

## 2011-03-26 NOTE — Telephone Encounter (Signed)
Notified of below.  She states she has iron at home and will begin that.  I advised pt that I have samples at office when she runs out.  I will check with lab to see if she can have breath test in [place] and let her know.  She complains of increased GERD symptoms off Nexium and questions if this test is really necessary.  I advised pt test was necessary and to try to make it as long as she could off Nexium.  Advised OK to take Tums.  I will mail pt hemoccult cards.

## 2011-03-27 ENCOUNTER — Encounter: Payer: Self-pay | Admitting: Adult Health

## 2011-03-27 ENCOUNTER — Ambulatory Visit (INDEPENDENT_AMBULATORY_CARE_PROVIDER_SITE_OTHER): Payer: 59 | Admitting: Adult Health

## 2011-03-27 DIAGNOSIS — R0789 Other chest pain: Secondary | ICD-10-CM

## 2011-03-27 NOTE — Assessment & Plan Note (Signed)
She continues to have occasional chest pain. I am uncertain if this is not musculoskeletal in the setting of fibromyalgia.  She states that taking the additional norvasc is helpful, but I am concerned of the prn usage, which may decrease BP for long period of time.  I discussed possibility of NTG sublingual, but she suffers from migraines and this may exacerbate this from side effect of headache with this use.  She will see [place] on a prn basis, as her coronaries are normal and the chest discomfort in intermittent with no clear cardiac etiology.  If her PCP feels that it is necessary, will be be happy to see her again.

## 2011-03-27 NOTE — Progress Notes (Signed)
HPI:  Alexandra Reid is a 45 y/o patient of Dr. Excell Seltzer and McGowen we are seeing on follow-up after having had cardiac catheterization for chest pain. She has a history of severe fibromyalgia, GERD, anxiety.  Cardiac catheterization in March demonstrated normal coronary arteries, with questionable coronary artery spasm. She was placed on Norvasc 5mg  for this.  She states that she sometimes has recurrent chest pain at rest, but not for a lengthy duration. She states she takes an additional norvasc when this happens. She does not take her BP prior. She states the chest discomfort goes away within 1 hour of taking the extra dose. Otherwise, she is doing well from a cardiac standpoint. She has had recent surgery for rectal prolapse and partial colectomy in July of 2012.   No Known Allergies  Current Outpatient Prescriptions  Medication Sig Dispense Refill  . albuterol (PROAIR HFA) 108 (90 BASE) MCG/ACT inhaler Inhale 2 puffs into the lungs every 4 (four) hours as needed.        Marland Kitchen amitriptyline (ELAVIL) 100 MG tablet Take 100 mg by mouth at bedtime.        Marland Kitchen amLODipine (NORVASC) 5 MG tablet Take 1 tablet (5 mg total) by mouth daily.  90 tablet  3  . Ascorbic Acid (VITAMIN C) 500 MG tablet Take 500 mg by mouth daily.        . Calcium Carbonate (CALCIUM 500 PO) Take 1 tablet by mouth daily.        . carisoprodol (SOMA) 250 MG tablet 1 tab po tid prn  270 tablet  0  . clonazePAM (KLONOPIN) 1 MG tablet Take 1 tablet (1 mg total) by mouth as directed. Take 1 tab po q8h  270 tablet  0  . DULoxetine (CYMBALTA) 60 MG capsule Take 1 capsule (60 mg total) by mouth daily.  90 capsule  5  . Fish Oil OIL Take 2 capsules by mouth daily.        . Fluticasone-Salmeterol (ADVAIR DISKUS) 250-50 MCG/DOSE AEPB Inhale 1 puff into the lungs every 12 (twelve) hours.        . ondansetron (ZOFRAN-ODT) 8 MG disintegrating tablet Take 1 tablet (8 mg total) by mouth every 8 (eight) hours as needed. For nausea  20 tablet  3  .  oxyCODONE (OXY IR/ROXICODONE) 5 MG immediate release tablet Take 1-2 tablets by mouth every 4 hours as needed for breakthrough pain  300 tablet  0  . Oxycodone HCl (OXYCONTIN) 60 MG TB12 Take 1 tablet (60 mg total) by mouth 2 (two) times daily. Pain contract in chart  60 each  0  . polyethylene glycol (MIRALAX / GLYCOLAX) packet Take 17 g by mouth daily.        . promethazine (PHENERGAN) 25 MG tablet Take 1-2 tablets every 6 hours as needed for nausea  90 tablet  3  . sucralfate (CARAFATE) 1 G tablet 1 tab about 1 hour prior to each meal  90 tablet  0  . zolpidem (AMBIEN) 10 MG tablet Take 10 mg by mouth at bedtime as needed.        Marland Kitchen esomeprazole (NEXIUM) 40 MG capsule Take 1 capsule (40 mg total) by mouth daily before breakfast.  90 capsule  3    Past Medical History  Diagnosis Date  . DDD (degenerative disc disease), lumbar   . Atypical chest pain     Question of coronary vasospasm; cath 10/20/10 NORMAL  . Anxiety   . Fibromyalgia  Dr. Hope Pigeon [phone]  . Interstitial cystitis   . Obesity   . Insomnia   . Transaminasemia 03/2010    (ALT 49), normal on f/u testing off of daily tylenol uuse.  Marland Kitchen GERD (gastroesophageal reflux disease) 06/01/10    Dyspepsia/gastritis, EGD: gastritis (NSAIDS)  . Rectal bleeding 06/01/10    Anorectal bleeding/BRBPR--colonoscopy: internal hemorrhoids  . Recurrent UTI     renal u/s normal 07/2007  . Weight loss     CT abd/pelf 08/22/10 remakable only for bile duct 9mm (unchanged from 03/2004 PRIOR to ERCP w/spincterotomy, bile duct balloon dredging and lap chole)  . Recurrent fever     W/u unrevealing, including ID consult.    . Asthma   . Osteopenia     bone densitometry 2006 per pt  . 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 Christella Hartigan  . Multinodular goiter 11/2010    TSH normal  . Chest pain   . Rectal prolapse 02/2011    Past Surgical History  Procedure Date  . Bunionectomy     right foot  . Abdominal  hysterectomy   . Bilateral salpingoophorectomy 2003    endometriosis  . Cholecystectomy 2005    lap--(cholelithiasis w/choledocholithiasis on u/s 2005--PreOperative ERCP showed NO stone or other obstruction in the biliary tract, but CBD messured 9mm near head of pancreas.  S sphincterotomy and bile duct balloon dredging was done at that time.  MRCP 10/2010 NORMAL.  Marland Kitchen Orif metatarsal fracture 2006    Right 2nd and 3rd metatarsals (Dr. Romeo Apple)  . Biopsy thyroid 11/2010    Fine needle: NONmalignant goiter  . Rectal prolapse repair, rectopexy 05July2012    with sigmoid colectomy    AVW:[id number] of systems complete and found to be negative unless listed above PHYSICAL EXAM BP 119/80  Pulse 102  Ht 5' 4.5" (1.638 m)  Wt 180 lb (81.647 kg)  BMI 30.42 kg/m2  SpO2 98% General: Well developed, well nourished, in no acute distress Head: Eyes PERRLA, No xanthomas.   Normal cephalic and atramatic  Lungs: Clear bilaterally to auscultation and percussion. Heart: HRRR S1 S2, mildly tachycardic.  Pulses are 2+ & equal.            No carotid bruit. No JVD.  No abdominal bruits. No femoral bruits. Abdomen: Bowel sounds are positive, abdomen soft and non-tender without masses or                  Hernia's noted. Msk:  Back normal, normal gait. Normal strength and tone for age. Extremities: No clubbing, cyanosis or edema.  DP +1 Neuro: Alert and oriented X 3. Psych:  Good affect, responds appropriately  EKG: HR 106, sinus tachycardia  ASSESSMENT AND PLAN

## 2011-03-27 NOTE — Patient Instructions (Addendum)
Your physician recommends that you schedule a follow-up appointment in: as needed  

## 2011-03-28 ENCOUNTER — Other Ambulatory Visit: Payer: Self-pay | Admitting: Family Medicine

## 2011-04-03 ENCOUNTER — Telehealth: Payer: Self-pay | Admitting: *Deleted

## 2011-04-03 DIAGNOSIS — K2970 Gastritis, unspecified, without bleeding: Secondary | ICD-10-CM

## 2011-04-03 NOTE — Telephone Encounter (Signed)
Noted-PM 

## 2011-04-03 NOTE — Telephone Encounter (Signed)
VM left on 04/02/11 for several issues. 1-pt would like to have breath test on Thursday at [facility] in [place].  Per Kara Mead, pt will have to go to [facility] location.   2-pt would like to do stool tests while at [facility].  Pt needs to do home hemoccult x 3. 3-pt would also like 1-2 week RX for vicodin for breakthrough abdominal pain.  Pain is 8-9 on 10 scale.  Pt can occasionally get pain down to a 7.  Pt is "pretty much miserable" and states it "hurts to tolerate pain".  RC to pt on 04/03/11: 1-advised pt she will have to go to [address] location.  Pt would like to do on Wednesday as she will be in [place] on that day.  This will be day 12 instead of 14 off of Nexium.  Advised pt OK. 2-Advised pt she will need to do hemoccults from our office.  She is free to ask the lab if they have them, but otherwise she should come by our office tomorrow to pick up another set if she has not received the ones mailed to her by that time.  Pt repeatedly asked if I was going to mail another set, and I told her each time she needed to come pick up another set from our office tomorrow.  At the end of the call, pt again asked if I was going to mail them to her and I advised her due to her abdominal pain and anemia per Dr. Milinda Cave she should come by our office to pick them up.  Again pt asked for them to be mailed and I told pt if she has not received any in the mail by tomorrow to call me and we will go from there, but she should be coming to pick them up from our office even though it is out of the way. 3-Advised pt that I spoke to Dr. Milinda Cave regarding pain med and he has denied.  Pt voices understanding.

## 2011-04-04 ENCOUNTER — Other Ambulatory Visit: Payer: Self-pay | Admitting: Family Medicine

## 2011-04-04 ENCOUNTER — Encounter (INDEPENDENT_AMBULATORY_CARE_PROVIDER_SITE_OTHER): Payer: Self-pay | Admitting: Surgery

## 2011-04-04 ENCOUNTER — Ambulatory Visit (INDEPENDENT_AMBULATORY_CARE_PROVIDER_SITE_OTHER): Payer: Commercial Managed Care - PPO | Admitting: Surgery

## 2011-04-04 VITALS — BP 130/82 | HR 70 | Temp 96.5°F | Ht 64.5 in | Wt 181.4 lb

## 2011-04-04 DIAGNOSIS — K623 Rectal prolapse: Secondary | ICD-10-CM

## 2011-04-04 NOTE — Progress Notes (Signed)
Subjective:     Patient ID: Alexandra Reid, female   DOB: 20-Dec-1965, 45 y.o.   MRN: [id number]  HPI   The patient comes in feeling okay. She is now one month postoperative.  She uses MiraLax PRN.  The patient is concerned about "weird" bowel habits. She notes sometimes bowel movements are "smears." Other times there are just "flakes." She tells me she's having a lot. When I clarify frequency, she notes it's 2-3 bowel movements a day. No blood.  There is concern that perhaps she is Helicobactor pylori positive. She's getting a confirmatory test for this. She stopped her PPIs. She has been told she is anemic. It was recommended she start iron. She was hesitant to start that. She wondered if I could do a Hemoccult on her to see if she's heme positive.  She continues to avoid nonsteroidals. She is off postoperative Dilaudid now.  She notes some upper abdominal pain. She also notes lower abdominal crampy pain. She is sensitive at her incisions at times. Her appetite is not normal. However, she is trying lose weight and focus on more proteins in her diet.  Review of Systems  Constitutional: Negative for fever, chills, diaphoresis, appetite change and fatigue.  HENT: Negative for nosebleeds, sore throat, mouth sores, neck pain and neck stiffness.   Eyes: Negative for photophobia, discharge and visual disturbance.  Respiratory: Negative for cough, choking, chest tightness and shortness of breath.   Cardiovascular: Negative for chest pain and palpitations.  Gastrointestinal: Negative for nausea, vomiting, diarrhea, constipation, blood in stool, abdominal distention, anal bleeding and rectal pain.       Suprapubic & epigastric soreness  Genitourinary: Negative for dysuria, frequency, flank pain, vaginal bleeding, vaginal discharge and difficulty urinating.  Musculoskeletal: Negative for back pain, arthralgias and gait problem.  Skin: Negative for color change, pallor and rash.  Neurological: Negative  for dizziness, speech difficulty, weakness and numbness.  Hematological: Negative for adenopathy. Does not bruise/bleed easily.  Psychiatric/Behavioral: Negative for confusion and agitation. The patient is not nervous/anxious.        Objective:   Physical Exam  Constitutional: She is oriented to person, place, and time. She appears well-developed and well-nourished. No distress.  HENT:  Head: Normocephalic.  Mouth/Throat: Oropharynx is clear and moist. No oropharyngeal exudate.  Eyes: Conjunctivae and EOM are normal. Pupils are equal, round, and reactive to light. No scleral icterus.  Neck: Normal range of motion. Neck supple. No tracheal deviation present.  Cardiovascular: Normal rate, regular rhythm and intact distal pulses.   Pulmonary/Chest: Effort normal and breath sounds normal. No respiratory distress. She exhibits no tenderness.  Abdominal: Soft. She exhibits no distension and no mass. There is no rebound and no guarding. Hernia confirmed negative in the right inguinal area and confirmed negative in the left inguinal area.       Incisions closed.  Mild umbilical sensitivity.  No hernias  Genitourinary: Vagina normal. No vaginal discharge found.       Perianal area clean.  No rash.  NST.  No anorectal masses.  Soft thin stool in rectal vault.  Grossly heme negative.  Musculoskeletal: Normal range of motion. She exhibits no tenderness.  Lymphadenopathy:    She has no cervical adenopathy.       Right: No inguinal adenopathy present.       Left: No inguinal adenopathy present.  Neurological: She is alert and oriented to person, place, and time. No cranial nerve deficit. She exhibits normal muscle tone. Coordination normal.  Skin: Skin is warm and dry. No rash noted. She is not diaphoretic. No erythema.  Psychiatric: Her behavior is normal. Judgment and thought content normal.       Anxious/nervous at times, but smiling at the end of the visit       Assessment:     ONe month  status post sigmoid colectomy and suture/mesh rectopexy for rectal prolapse. Doing okay considering IBS and fibromyalgia and anxiety/depression.    Plan:     Use MiraLax daily to avoid constipation/diarrhea.  Start at a 1/2 dose and adjust very slowly.    I cautioned her that her bowels would not be normal for the first few months after any colon surgery. With her IBS, it will be a challenge the rest of her life. Consider switching to flaxseed if she's having problems with flatus. I stressed to her that she will need some type of fiber supplement the rest of her life but adjust very slowly. Hopefully, this will blunt the extremes of constipation and diarrhea that she has struggled through to the past. Consider follow up with her primary care physician or her gastroenterologist should the symptoms not improve.  Complete workup for H. pylori. Treat properly if needed. Endoscopy if not better, but her endoscopy in fall 2011 showed mild gastritis only.  Anxiolysis. Her that would be helpful. She is due to follow up with her psychiatrist according to her PCP's last visit note.  Return to clinic PRN.  She felt reassured and expressed appreciation.

## 2011-04-04 NOTE — Patient Instructions (Signed)
Try 1/2 dose Miralax daily.  Adjust weekly (no sooner than this) to get to 1-3 BM/day.

## 2011-04-06 ENCOUNTER — Telehealth: Payer: Self-pay | Admitting: *Deleted

## 2011-04-06 ENCOUNTER — Emergency Department (HOSPITAL_COMMUNITY)
Admission: EM | Admit: 2011-04-06 | Discharge: 2011-04-06 | Disposition: A | Discharge status: Home / Self Care | Payer: 59 | Attending: Emergency Medicine | Admitting: Emergency Medicine

## 2011-04-06 ENCOUNTER — Emergency Department (HOSPITAL_COMMUNITY): Payer: 59

## 2011-04-06 DIAGNOSIS — IMO0001 Reserved for inherently not codable concepts without codable children: Secondary | ICD-10-CM | POA: Insufficient documentation

## 2011-04-06 DIAGNOSIS — R1031 Right lower quadrant pain: Secondary | ICD-10-CM | POA: Insufficient documentation

## 2011-04-06 DIAGNOSIS — J45909 Unspecified asthma, uncomplicated: Secondary | ICD-10-CM | POA: Insufficient documentation

## 2011-04-06 DIAGNOSIS — R509 Fever, unspecified: Secondary | ICD-10-CM | POA: Insufficient documentation

## 2011-04-06 LAB — POCT I-STAT, CHEM 8
BUN: <3 mg/dL — ABNORMAL LOW (ref 6–23)
Chloride: 94 mEq/L — ABNORMAL LOW (ref 96–112)
Creatinine, Ser: 0.8 mg/dL (ref 0.50–1.10)
Glucose, Bld: 105 mg/dL — ABNORMAL HIGH (ref 70–99)
HCT: 35 % — ABNORMAL LOW (ref 36.0–46.0)
Potassium: 4.4 mEq/L (ref 3.5–5.1)

## 2011-04-06 LAB — DIFFERENTIAL
Eosinophils Relative: 0 % (ref 0–5)
Lymphocytes Relative: 9 % — ABNORMAL LOW (ref 12–46)
Lymphs Abs: 1.2 10*3/uL (ref 0.7–4.0)
Monocytes Absolute: 0.4 10*3/uL (ref 0.1–1.0)
Monocytes Relative: 3 % (ref 3–12)

## 2011-04-06 LAB — URINALYSIS, ROUTINE W REFLEX MICROSCOPIC
Bilirubin Urine: NEGATIVE
Hgb urine dipstick: NEGATIVE
Specific Gravity, Urine: 1.003 — ABNORMAL LOW (ref 1.005–1.030)
pH: 7.5 (ref 5.0–8.0)

## 2011-04-06 LAB — H. PYLORI BREATH TEST: H. pylori UBiT: NOT DETECTED

## 2011-04-06 LAB — CBC
HCT: 32.3 % — ABNORMAL LOW (ref 36.0–46.0)
MCV: 77.3 fL — ABNORMAL LOW (ref 78.0–100.0)
RDW: 16.1 % — ABNORMAL HIGH (ref 11.5–15.5)
WBC: 13.4 10*3/uL — ABNORMAL HIGH (ref 4.0–10.5)

## 2011-04-06 MED ORDER — IOHEXOL 300 MG/ML  SOLN
100.0000 mL | Freq: Once | INTRAMUSCULAR | Status: AC | PRN
  Administered 2011-04-06: 100 mL via INTRAVENOUS

## 2011-04-06 NOTE — Telephone Encounter (Signed)
Noted-PM 

## 2011-04-06 NOTE — Telephone Encounter (Signed)
Pt calls in tears stating that she is having extreme RLQ abdominal pain.  States she is "hurting really bad".  Pt took an extra [place] last night just to get to sleep.  She is having chills this AM and fever of 103.  Pt wants to know if she goes to ED which ED should she go to.  Advised pt that Dr. Michaell Cowing and Dr. Christella Hartigan are closer to [facility], but if she is in extreme pain she needs to go to [facility] which is closer to her.  Pt states she had 4 BM's yesterday that were formed, but soft.  She does say that she has nausea today, but no vomiting.  It hurts to touch right side of abdomen.  Advised pt if she is in that much pain, has fever and nausea she should go to ED.  Pt states her husband is coming home at noon and if she is still in pain she will get him to take her to the ED.   Pt is very frustrated that she is in so much pain and she is supposed be leaving for a mountain vacation today.  Reassured pt she needs to take care of herself first and if time allows she can go on vacation.

## 2011-04-11 ENCOUNTER — Telehealth (INDEPENDENT_AMBULATORY_CARE_PROVIDER_SITE_OTHER): Payer: Self-pay

## 2011-04-11 NOTE — Telephone Encounter (Signed)
Test was done by pt on 04/04/11.  Results received by Dr. Milinda Cave.

## 2011-04-11 NOTE — Telephone Encounter (Signed)
Pain level 7, lower abdomen, for 6 days- constant intense jabbing pain, taking mira lax, gas bloating, voiding, last bm last night runny. Patient would like pain medicine if possible for the pain. Patient seen in the ER on Friday 04/06/2011.  Request sent to urgent office.

## 2011-04-12 ENCOUNTER — Telehealth (INDEPENDENT_AMBULATORY_CARE_PROVIDER_SITE_OTHER): Payer: Self-pay

## 2011-04-12 NOTE — Telephone Encounter (Signed)
Called pt after message that was left yesterday about her abdominal pain. Dr Johna Sheriff really didn't feel comfortable giving pt pain med. Per pt's request. Neysa Bonito talked to me about the pt and I advised that I would go over the chart with Dr Michaell Cowing in the am. Dr Michaell Cowing reviewed the pt's records and reveiwed the labs and CT the pt had done last wk at the ER. Per DR Michaell Cowing the pt can use heat and add Aleve BID to the chronic pain med. That the pt is already getting from her PCP. Dr Michaell Cowing doesn't want to prescribe pain med. B/c he wants pt only getting it from one doctor. The pt understands all this and pt will try to continue to take Miralax daily to help with her constipation.Hulda Humphrey

## 2011-04-13 ENCOUNTER — Other Ambulatory Visit: Payer: Self-pay | Admitting: Family Medicine

## 2011-04-16 NOTE — Telephone Encounter (Signed)
RF authorized--PM

## 2011-04-16 NOTE — Telephone Encounter (Signed)
Is this OK? Please advise.

## 2011-04-17 ENCOUNTER — Ambulatory Visit (INDEPENDENT_AMBULATORY_CARE_PROVIDER_SITE_OTHER): Payer: 59 | Admitting: Family Medicine

## 2011-04-17 ENCOUNTER — Encounter: Payer: Self-pay | Admitting: Family Medicine

## 2011-04-17 DIAGNOSIS — E042 Nontoxic multinodular goiter: Secondary | ICD-10-CM

## 2011-04-17 DIAGNOSIS — M797 Fibromyalgia: Secondary | ICD-10-CM

## 2011-04-17 DIAGNOSIS — A689 Relapsing fever, unspecified: Secondary | ICD-10-CM

## 2011-04-17 DIAGNOSIS — R1013 Epigastric pain: Secondary | ICD-10-CM

## 2011-04-17 DIAGNOSIS — F329 Major depressive disorder, single episode, unspecified: Secondary | ICD-10-CM

## 2011-04-17 DIAGNOSIS — IMO0001 Reserved for inherently not codable concepts without codable children: Secondary | ICD-10-CM

## 2011-04-17 MED ORDER — OXYCODONE HCL 5 MG PO TABS: ORAL_TABLET | ORAL | Status: DC

## 2011-04-17 MED ORDER — OXYCODONE HCL 60 MG PO TB12: 1.0000 | ORAL_TABLET | Freq: Two times a day (BID) | ORAL | Status: DC

## 2011-04-17 MED ORDER — VALACYCLOVIR HCL 500 MG PO TABS: ORAL_TABLET | ORAL | Status: DC

## 2011-04-17 MED ORDER — LUBIPROSTONE 24 MCG PO CAPS: 24.0000 ug | ORAL_CAPSULE | Freq: Two times a day (BID) | ORAL | Status: AC

## 2011-04-17 MED ORDER — ONDANSETRON 8 MG PO TBDP: 8.0000 mg | ORAL_TABLET | Freq: Three times a day (TID) | ORAL | Status: DC | PRN

## 2011-04-17 MED ORDER — ZOLPIDEM TARTRATE 10 MG PO TABS: 10.0000 mg | ORAL_TABLET | Freq: Every evening | ORAL | Status: DC | PRN

## 2011-04-17 NOTE — Telephone Encounter (Signed)
RX faxed

## 2011-04-17 NOTE — Assessment & Plan Note (Signed)
Problem stable.  Continue cymbalta and psychiatrist visits.  We have reviewed our general long term plan for this problem and also reviewed symptoms and signs that should prompt the patient to call or return to the office.

## 2011-04-17 NOTE — Assessment & Plan Note (Signed)
Extensive w/u unrevealing. No new symptomatology since last visit, although she continues to feel like the episodes are getting closer together. Continue monitoring for new symptoms plus periodic check of appropriate labs.

## 2011-04-17 NOTE — Assessment & Plan Note (Signed)
Recurrent abd pain in various areas.  Fortunately imaging has been reassuring. Will get more aggressive with constipation treatment.  Continue miralax titration, plus I added amitiza bid today (sample box of #20 given today, plus rx with copay coupon).

## 2011-04-17 NOTE — Progress Notes (Signed)
OFFICE VISIT  04/17/2011   CC:  Chief Complaint  Patient presents with  . Follow-up     HPI:    Patient is a 45 y.o. Caucasian female who presents for routine f/u for chronic pain syndrome/fibromyalgia, depression, recurrent abd pain, chronic constipation. Had episode of acute RLQ abd pain last week, went to [facility] ED and CT abd was negative for any acute process and did show significant stool in colon.  I reviewed the ED records for that visit. Feeling better now, basically has baseline abd pain/tenderness. She is working on trying to titrate miralax.  No blood or mucous in stool. Still having recurrent febrile episodes that have been well described in past notes, no new symptomatology.   She saw the psychiatrist twice since I saw her 1 month ago, has another f/u in 3d.  Says this is going well, working on coping with chronic pain and working on ways to diminish feelings of guilt.  Pain in essentially all areas of body is unchanged since last visit.  Says she feels like she needs even more pain meds but then expresses understanding that I am not willing to increase her narcotic doses. She has markedly diminished functioning due to her pain; can do of light housework at a time and then must rest.  She uses heating pain some, ice some, rare NSAID use due to hx of NSAID-induced gastritis.  Of note, recent gastritis sx's improved some/more sporadic, still taking carafate and PPI.  Urea breath testing was negative (H. Pylori IgG neg, but IgM was positive).   Past Medical History  Diagnosis Date  . DDD (degenerative disc disease), lumbar   . Atypical chest pain     Question of coronary vasospasm; cath 10/20/10 NORMAL  . Anxiety   . Fibromyalgia     Dr. Hope Pigeon [phone]  . Interstitial cystitis   . Obesity   . Insomnia   . Transaminasemia 03/2010    (ALT 49), normal on f/u testing off of daily tylenol uuse.  Marland Kitchen GERD (gastroesophageal reflux disease) 06/01/10   Dyspepsia/gastritis, EGD: gastritis (NSAIDS)  . Rectal bleeding 06/01/10    Anorectal bleeding/BRBPR--colonoscopy: internal hemorrhoids  . Recurrent UTI     renal u/s normal 07/2007  . Weight loss     CT abd/pelf 08/22/10 remakable only for bile duct 9mm (unchanged from 03/2004 PRIOR to ERCP w/spincterotomy, bile duct balloon dredging and lap chole)  . Recurrent fever     W/u unrevealing, including ID consult.    . Asthma   . Osteopenia     bone densitometry 2006 per pt  . 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 Christella Hartigan  . Multinodular goiter 11/2010    TSH normal  . Chest pain   . Rectal prolapse 02/2011    repaired lap [id number]    Past Surgical History  Procedure Date  . Bunionectomy     right foot  . Abdominal hysterectomy   . Bilateral salpingoophorectomy 2003    endometriosis  . Cholecystectomy 2005    lap--(cholelithiasis w/choledocholithiasis on u/s 2005--PreOperative ERCP showed NO stone or other obstruction in the biliary tract, but CBD messured 9mm near head of pancreas.  S sphincterotomy and bile duct balloon dredging was done at that time.  MRCP 10/2010 NORMAL.  Marland Kitchen Orif metatarsal fracture 2006    Right 2nd and 3rd metatarsals (Dr. Romeo Apple)  . Biopsy thyroid 11/2010    Fine needle: NONmalignant goiter  . Rectal prolapse repair, rectopexy 05July2012  with sigmoid colectomy    Outpatient Prescriptions Prior to Visit  Medication Sig Dispense Refill  . albuterol (PROAIR HFA) 108 (90 BASE) MCG/ACT inhaler Inhale 2 puffs into the lungs every 4 (four) hours as needed.        Marland Kitchen amitriptyline (ELAVIL) 100 MG tablet TAKE 1 TABLET BY MOUTH AT BEDTIME  90 tablet  1  . amLODipine (NORVASC) 5 MG tablet Take 1 tablet (5 mg total) by mouth daily.  90 tablet  3  . Ascorbic Acid (VITAMIN C) 500 MG tablet Take 500 mg by mouth daily.        . Calcium Carbonate (CALCIUM 500 PO) Take 1 tablet by mouth daily.        . carisoprodol (SOMA) 250 MG tablet TAKE 1  TABLET BY MOUTH THREE TIMES DAILY AS NEEDED  270 tablet  0  . clonazePAM (KLONOPIN) 1 MG tablet Take 1 tablet (1 mg total) by mouth as directed. Take 1 tab po q8h  270 tablet  0  . DULoxetine (CYMBALTA) 60 MG capsule Take 1 capsule (60 mg total) by mouth daily.  90 capsule  5  . esomeprazole (NEXIUM) 40 MG capsule Take 1 capsule (40 mg total) by mouth daily before breakfast.  90 capsule  3  . Fish Oil OIL Take 2 capsules by mouth daily.        . Fluticasone-Salmeterol (ADVAIR DISKUS) 250-50 MCG/DOSE AEPB Inhale 1 puff into the lungs every 12 (twelve) hours.        . polyethylene glycol (MIRALAX / GLYCOLAX) packet Take 17 g by mouth daily.        . promethazine (PHENERGAN) 25 MG tablet Take 1-2 tablets every 6 hours as needed for nausea  90 tablet  3  . sucralfate (CARAFATE) 1 G tablet 1 tab about 1 hour prior to each meal  90 tablet  0  . ondansetron (ZOFRAN-ODT) 8 MG disintegrating tablet Take 1 tablet (8 mg total) by mouth every 8 (eight) hours as needed. For nausea  20 tablet  3  . oxyCODONE (OXY IR/ROXICODONE) 5 MG immediate release tablet Take 1-2 tablets by mouth every 4 hours as needed for breakthrough pain  300 tablet  0  . Oxycodone HCl (OXYCONTIN) 60 MG TB12 Take 1 tablet (60 mg total) by mouth 2 (two) times daily. Pain contract in chart  60 each  0  . zolpidem (AMBIEN) 10 MG tablet Take 10 mg by mouth at bedtime as needed.          No Known Allergies  ROS As per HPI  PE: Blood pressure 121/82, pulse 102, temperature 97.9 F (36.6 C), temperature source Oral, weight 172 lb (78.019 kg), SpO2 100.00%. Gen: Alert, well appearing.  Patient is oriented to person, place, time, and situation. NECK: soft, no mass.  Her thyroid gland is mildly enlarged diffusely.  It is soft and there is minimal tenderness to palpation. Chest: symmetric expansion, nonlabored respirations.  Clear and equal breath sounds in all lung fields.   CV: RRR, no m/r/g.  Peripheral pulses 2+ and symmetric. ABD:  soft, nondistended, BS normal.  Diffusely tender-mild-as per usual  LABS:  None today  IMPRESSION AND PLAN:  FIBROMYALGIA, SEVERE Problem stable.  Continue current medications and diet appropriate for this condition.  We have reviewed our general long term plan for this problem and also reviewed symptoms and signs that should prompt the patient to call or return to the office.  RFs for soma, oxycontin, oxycodone, and ambien  were done today. F/u 60mo.   FEVER, RECURRENT Extensive w/u unrevealing. No new symptomatology since last visit, although she continues to feel like the episodes are getting closer together. Continue monitoring for new symptoms plus periodic check of appropriate labs.  Abdominal pain, epigastric Recurrent abd pain in various areas.  Fortunately imaging has been reassuring. Will get more aggressive with constipation treatment.  Continue miralax titration, plus I added amitiza bid today (sample box of #20 given today, plus rx with copay coupon).   DEPRESSION Problem stable.  Continue cymbalta and psychiatrist visits.  We have reviewed our general long term plan for this problem and also reviewed symptoms and signs that should prompt the patient to call or return to the office.   Multinodular goiter Detected on CT chest done for r/o PE 11/2010, then f/u u/s confirmed multinodular thyroid.  Due to size of a few of the nodules/cysts plus one nearby 1.3 cm mediastinal lymph node, an ultrasounded guided biopsy was recommended to r/o malignancy. FN biopsy showed NONmalignant goiter.  Thyroid peroxidase antibody NEG. TSH normal but has been trending down (was 0.57 about 3 wks ago).  Will recheck TSH, free T4, and T3 total today.      FOLLOW UP: Return in about 1 month (around 05/17/2011) for f/u fibromyalgia and constipation.

## 2011-04-17 NOTE — Assessment & Plan Note (Signed)
Problem stable.  Continue current medications and diet appropriate for this condition.  We have reviewed our general long term plan for this problem and also reviewed symptoms and signs that should prompt the patient to call or return to the office.  RFs for soma, oxycontin, oxycodone, and Remus Loffler were done today. F/u 65mo.

## 2011-04-17 NOTE — Assessment & Plan Note (Signed)
Detected on CT chest done for r/o PE 11/2010, then f/u u/s confirmed multinodular thyroid.  Due to size of a few of the nodules/cysts plus one nearby 1.3 cm mediastinal lymph node, an ultrasounded guided biopsy was recommended to r/o malignancy. FN biopsy showed NONmalignant goiter.  Thyroid peroxidase antibody NEG. TSH normal but has been trending down (was 0.57 about 3 wks ago).  Will recheck TSH, free T4, and T3 total today.

## 2011-04-18 ENCOUNTER — Ambulatory Visit: Payer: 59 | Admitting: Family Medicine

## 2011-04-18 LAB — T4, FREE: Free T4: 0.88 ng/dL (ref 0.60–1.60)

## 2011-04-18 LAB — TSH: TSH: 0.74 u[IU]/mL (ref 0.35–5.50)

## 2011-05-01 ENCOUNTER — Telehealth: Payer: Self-pay | Admitting: Family Medicine

## 2011-05-01 NOTE — Telephone Encounter (Signed)
Pls notify patient that I have received a disability form from her insurer and we require $10 fee payment by patient prior to this form being filled out.---PM

## 2011-05-01 NOTE — Telephone Encounter (Signed)
I called the patient and informed her that her disability papers were ready and it would be a $10.00 charge for filling out the paper work.

## 2011-05-01 NOTE — Telephone Encounter (Signed)
Thank you Vanessa

## 2011-05-15 ENCOUNTER — Encounter: Payer: Self-pay | Admitting: Family Medicine

## 2011-05-15 ENCOUNTER — Ambulatory Visit (INDEPENDENT_AMBULATORY_CARE_PROVIDER_SITE_OTHER): Payer: 59 | Admitting: Family Medicine

## 2011-05-15 DIAGNOSIS — D509 Iron deficiency anemia, unspecified: Secondary | ICD-10-CM

## 2011-05-15 DIAGNOSIS — A689 Relapsing fever, unspecified: Secondary | ICD-10-CM

## 2011-05-15 DIAGNOSIS — K5900 Constipation, unspecified: Secondary | ICD-10-CM

## 2011-05-15 DIAGNOSIS — F329 Major depressive disorder, single episode, unspecified: Secondary | ICD-10-CM

## 2011-05-15 DIAGNOSIS — K5909 Other constipation: Secondary | ICD-10-CM

## 2011-05-15 DIAGNOSIS — E871 Hypo-osmolality and hyponatremia: Secondary | ICD-10-CM

## 2011-05-15 DIAGNOSIS — S20219A Contusion of unspecified front wall of thorax, initial encounter: Secondary | ICD-10-CM

## 2011-05-15 DIAGNOSIS — M797 Fibromyalgia: Secondary | ICD-10-CM

## 2011-05-15 DIAGNOSIS — IMO0001 Reserved for inherently not codable concepts without codable children: Secondary | ICD-10-CM

## 2011-05-15 MED ORDER — OXYCODONE HCL 60 MG PO TB12: 1.0000 | ORAL_TABLET | Freq: Two times a day (BID) | ORAL | Status: DC

## 2011-05-15 MED ORDER — AMITRIPTYLINE HCL 150 MG PO TABS: 150.0000 mg | ORAL_TABLET | Freq: Every day | ORAL | Status: DC

## 2011-05-15 MED ORDER — OXYCODONE HCL 5 MG PO TABS: ORAL_TABLET | ORAL | Status: DC

## 2011-05-15 NOTE — Progress Notes (Signed)
OFFICE VISIT  05/16/2011   CC:  Chief Complaint  Patient presents with  . Fibromyalgia  . Constipation     HPI:    Patient is a 45 y.o. Caucasian female who presents for 1 mo f/u for fibromyalgia/chronic pain syndrome, constipation, recurrent fevers. Chronic pain is unchanged: "all over" pain, worse in back, hips, knees--10/10 intensity brought to 5-6/10 with pain meds.  Also using heat/ice/lidocaine patches some.  No change in level of functioning/debility.  Acute pain: 4 d/a she got her foot caught in her pajama pants and fell directly onto her right side of chest, hit the toilet.  Was sore and tolerating the pain ok until last night when she coughed briefly after swallowing something wrong---pain intensified greatly and she asks for more pain med today.  No SOB.  Hurts on right side of chest when taking a deep breath, coughing, laughing, reaching, bending.  Constipation was improving on the amitiza samples I gave her but she didn't fill rx for this med b/c of cost, then she realized I gave her a discount coupon and she plans on filling rx today.  Says her episodes of fever are 10-14 days apart, no change.  Says she is still seeing psychiatrist, feels like it is helpful so far, working on getting rid of guilt and learning to enjoy life despite chronic pain. Says cymbalta was causing inability to have orgasm/enjoy sex with husband, so she d/c'd this. We have pretty much exhausted antidepressant trials for her due to either intolerable side effects or lack of efficacy.  Past Medical History  Diagnosis Date  . DDD (degenerative disc disease), lumbar   . Atypical chest pain     Question of coronary vasospasm; cath 10/20/10 NORMAL  . Anxiety   . Fibromyalgia     Dr. Hope Pigeon [phone]  . Interstitial cystitis   . Obesity   . Insomnia   . Transaminasemia 03/2010    (ALT 49), normal on f/u testing off of daily tylenol uuse.  Marland Kitchen GERD (gastroesophageal reflux disease) 06/01/10   Dyspepsia/gastritis, EGD: gastritis (NSAIDS)  . Rectal bleeding 06/01/10    Anorectal bleeding/BRBPR--colonoscopy: internal hemorrhoids  . Recurrent UTI     renal u/s normal 07/2007  . Weight loss     CT abd/pelf 08/22/10 remakable only for bile duct 9mm (unchanged from 03/2004 PRIOR to ERCP w/spincterotomy, bile duct balloon dredging and lap chole)  . Recurrent fever     W/u unrevealing, including ID consult.    . Asthma   . Osteopenia     bone densitometry 2006 per pt  . 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 Christella Hartigan  . Multinodular goiter 11/2010    TSH normal  . Chest pain   . Rectal prolapse 02/2011    repaired lap [id number]    Past Surgical History  Procedure Date  . Bunionectomy     right foot  . Abdominal hysterectomy   . Bilateral salpingoophorectomy 2003    endometriosis  . Cholecystectomy 2005    lap--(cholelithiasis w/choledocholithiasis on u/s 2005--PreOperative ERCP showed NO stone or other obstruction in the biliary tract, but CBD messured 9mm near head of pancreas.  S sphincterotomy and bile duct balloon dredging was done at that time.  MRCP 10/2010 NORMAL.  Marland Kitchen Orif metatarsal fracture 2006    Right 2nd and 3rd metatarsals (Dr. Romeo Apple)  . Biopsy thyroid 11/2010    Fine needle: NONmalignant goiter  . Rectal prolapse repair, rectopexy 05July2012    with  sigmoid colectomy    Outpatient Prescriptions Prior to Visit  Medication Sig Dispense Refill  . albuterol (PROAIR HFA) 108 (90 BASE) MCG/ACT inhaler Inhale 2 puffs into the lungs every 4 (four) hours as needed.        Marland Kitchen amLODipine (NORVASC) 5 MG tablet Take 1 tablet (5 mg total) by mouth daily.  90 tablet  3  . Ascorbic Acid (VITAMIN C) 500 MG tablet Take 500 mg by mouth daily.        . Calcium Carbonate (CALCIUM 500 PO) Take 1 tablet by mouth daily.        . carisoprodol (SOMA) 250 MG tablet TAKE 1 TABLET BY MOUTH THREE TIMES DAILY AS NEEDED  270 tablet  0  . clonazePAM (KLONOPIN) 1 MG  tablet Take 1 tablet (1 mg total) by mouth as directed. Take 1 tab po q8h  270 tablet  0  . DULoxetine (CYMBALTA) 60 MG capsule Take 1 capsule (60 mg total) by mouth daily.  90 capsule  5  . esomeprazole (NEXIUM) 40 MG capsule Take 1 capsule (40 mg total) by mouth daily before breakfast.  90 capsule  3  . Fish Oil OIL Take 2 capsules by mouth daily.        . Fluticasone-Salmeterol (ADVAIR DISKUS) 250-50 MCG/DOSE AEPB Inhale 1 puff into the lungs every 12 (twelve) hours.        Marland Kitchen lubiprostone (AMITIZA) 24 MCG capsule Take 1 capsule (24 mcg total) by mouth 2 (two) times daily with a meal.  180 capsule  1  . ondansetron (ZOFRAN-ODT) 8 MG disintegrating tablet Take 1 tablet (8 mg total) by mouth every 8 (eight) hours as needed. For nausea  20 tablet  3  . polyethylene glycol (MIRALAX / GLYCOLAX) packet Take 17 g by mouth daily.        . promethazine (PHENERGAN) 25 MG tablet Take 1-2 tablets every 6 hours as needed for nausea  90 tablet  3  . valACYclovir (VALTREX) 500 MG tablet 1 tab po bid x 3d prn outbreak  6 tablet  3  . zolpidem (AMBIEN) 10 MG tablet Take 1 tablet (10 mg total) by mouth at bedtime as needed.  90 tablet  1  . amitriptyline (ELAVIL) 100 MG tablet TAKE 1 TABLET BY MOUTH AT BEDTIME  90 tablet  1  . oxyCODONE (OXY IR/ROXICODONE) 5 MG immediate release tablet Take 1-2 tablets by mouth every 4 hours as needed for breakthrough pain  300 tablet  0  . Oxycodone HCl (OXYCONTIN) 60 MG TB12 Take 1 tablet (60 mg total) by mouth 2 (two) times daily. Pain contract in chart  60 each  0    No Known Allergies  ROS As per HPI  PE: Blood pressure 133/82, pulse 116, temperature 97.6 F (36.4 C), temperature source Oral, height 5' 4.5" (1.638 m), weight 175 lb (79.379 kg), SpO2 97.00%. Gen: Alert, well appearing.  Patient is oriented to person, place, time, and situation.  She cries intermittently, alternating with nervous laughter. Chest: symmetric expansion, nonlabored respirations.  Clear and  equal breath sounds in all lung fields.   CV: RRR, no m/r/g.  Peripheral pulses 2+ and symmetric. She has a large area of ecchymoses over her lower right rib cage area.  TTP here, no deformity, no palpable hematoma. No abdominal tenderness.  LABS:  None today  IMPRESSION AND PLAN:  FIBROMYALGIA, SEVERE Stable.  Continue current pain control, rx's given today. [facility] [place] but pt  states that they don't accept her insurance. Continue psych f/u.  DEPRESSION Increase amitriptyline to 150mg  qhs. Continue psychiatrist/counseling follow up INDEFINITELY.  FEVER, RECURRENT No change. No objective findings in thorough workup in recent past but will remain vigilant for new symptoms/clues to pursue. No labs today.  Rib contusion Recommended CXR to eval for fracture, order done and she plans on going for this in a few days. No additional pain meds.  Encouraged use of ice, rest, lidocaine patches prn.  Constipation, chronic She'll fill amitiza rx I gave last visit.  She may continue miralax prn and continue to maximize fiber in diet.   Anemia, iron def: Hb has been stable.  She has repeatedly failed to do hemoccult testing as we have asked.   She reports she has been taking an iron tab daily for at least the last month.  Will repeat labs next f/u in 1 mo.  Hyponatremia: unclear etiology (?chronic nausea).  She is to remain off of diuretics, avoid over-ingestion of free water. Will recheck Na next f/u in 42mo.  Multinodular goiter: bx neg for adenoma or malignancy; has been euthyroid.  Will periodically monitor TSH.  FOLLOW UP: Return in about 1 month (around 06/15/2011) for f/u fibromyalgia, constip, iron def anem.

## 2011-05-16 ENCOUNTER — Ambulatory Visit: Payer: 59 | Admitting: Family Medicine

## 2011-05-16 DIAGNOSIS — K5909 Other constipation: Secondary | ICD-10-CM | POA: Insufficient documentation

## 2011-05-16 NOTE — Assessment & Plan Note (Signed)
Increase amitriptyline to 150mg  qhs. Continue psychiatrist/counseling follow up INDEFINITELY.

## 2011-05-16 NOTE — Assessment & Plan Note (Signed)
She'll fill amitiza rx I gave last visit.  She may continue miralax prn and continue to maximize fiber in diet.

## 2011-05-16 NOTE — Assessment & Plan Note (Signed)
Stable.  Continue current pain control, rx's given today. Discussed retry/revisit to [facility] in [place] but pt states that they don't accept her insurance. Continue psych f/u.

## 2011-05-16 NOTE — Assessment & Plan Note (Signed)
Recommended CXR to eval for fracture, order done and she plans on going for this in a few days. No additional pain meds.  Encouraged use of ice, rest, lidocaine patches prn.

## 2011-05-16 NOTE — Assessment & Plan Note (Signed)
No change. No objective findings in thorough workup in recent past but will remain vigilant for new symptoms/clues to pursue. No labs today.

## 2011-05-17 LAB — POCT URINALYSIS DIP (DEVICE)
Bilirubin Urine: NEGATIVE
Glucose, UA: NEGATIVE
Ketones, ur: NEGATIVE
Operator id: 235561
Specific Gravity, Urine: 1.01
Urobilinogen, UA: 0.2

## 2011-05-25 ENCOUNTER — Telehealth: Payer: Self-pay | Admitting: *Deleted

## 2011-05-25 NOTE — Telephone Encounter (Signed)
"  Crackling sounds" is kind of nonspecific: could be anything from ligament movement to bone movement to air trapped in the tissue beneath the skin.  I recommend she get the chest x-ray like I recommended (and ordered) when she was here last time.---PM

## 2011-05-25 NOTE — Telephone Encounter (Signed)
Notified pt description to non specific and she should really have xray taken.  Advised order in for [facility].  Pt asks if xray is highly recommended by Dr. Milinda Cave and I advised yes.

## 2011-05-25 NOTE — Telephone Encounter (Signed)
PC from patient stating she was massaging rib area (that has previously been evaluated) with Icy/Hot and heard a crackling sound.  Pt wonders if this could be connective tissue either strectching or moving.  Pt states area is tender from previous injury, but no new or severe pain.  Pt still declines xray.  Please advise.

## 2011-05-28 ENCOUNTER — Other Ambulatory Visit: Payer: Self-pay | Admitting: Family Medicine

## 2011-05-28 ENCOUNTER — Ambulatory Visit (HOSPITAL_COMMUNITY)
Admission: RE | Admit: 2011-05-28 | Discharge: 2011-05-28 | Disposition: A | Discharge status: Home / Self Care | Payer: 59 | Source: Ambulatory Visit | Attending: Family Medicine | Admitting: Family Medicine

## 2011-05-28 DIAGNOSIS — S20219A Contusion of unspecified front wall of thorax, initial encounter: Secondary | ICD-10-CM

## 2011-05-28 DIAGNOSIS — Y92009 Unspecified place in unspecified non-institutional (private) residence as the place of occurrence of the external cause: Secondary | ICD-10-CM | POA: Insufficient documentation

## 2011-05-28 DIAGNOSIS — W1809XA Striking against other object with subsequent fall, initial encounter: Secondary | ICD-10-CM | POA: Insufficient documentation

## 2011-05-28 DIAGNOSIS — R0789 Other chest pain: Secondary | ICD-10-CM | POA: Insufficient documentation

## 2011-05-28 DIAGNOSIS — S2239XA Fracture of one rib, unspecified side, initial encounter for closed fracture: Secondary | ICD-10-CM | POA: Insufficient documentation

## 2011-05-28 NOTE — Telephone Encounter (Signed)
Voicemail from patient stating Alexandra Reid could not see order for chest xray.  Order printed and faxed to [facility].  Pt notified.

## 2011-05-29 ENCOUNTER — Telehealth: Payer: Self-pay

## 2011-05-29 NOTE — Progress Notes (Signed)
Quick Note:  Pls notify pt that her rib x-ray did show that her 10th and 11th ribs on the right are fractured. No other abnormality. Tell her that the tissues at this area are a bit more mobile due to the fractures--this is to be expected--and this can explain the noise she has been hearing. Reassure her that there is nothing new to recommend as far as treatment. Get an update on how she's doing clinically. Thx--PM ______

## 2011-05-29 NOTE — Telephone Encounter (Signed)
I gave her plenty of pain meds and I have repeatedly told her not to overuse them.  If she runs out early, then she overused them.  I will not fill them early.  --PM

## 2011-05-29 NOTE — Progress Notes (Signed)
Noted.  No new recommendations.  Reassure pt.--PM

## 2011-05-29 NOTE — Telephone Encounter (Signed)
Pt states she is conservative with her pain medication but feels that she is going to run out before her next appt? Pt thinks she is going to run out 3-4 days before next visit? Please advise?

## 2011-05-30 NOTE — Telephone Encounter (Signed)
Patient informed and states she will keep up with the ice, heat, and ibuprofen

## 2011-06-04 NOTE — Telephone Encounter (Signed)
FYI Pt called today to request more pain meds.  She states she is being conservative, but she will run out of oxycodone 4-5 days early.  She tried to use Motrin, but it began to upset her stomach so she stopped.  Pt states she "respects tough love and all that goodness", but she feels broken ribs and surgery are exceptions to the pain med rules.  Aarian has an appt on Tuesday and would like Vicodin until that time, or pt is willing to come in on Friday for a face-to-face.  Advised pt that Dr. Milinda Cave out of office on Friday.  I informed pt that on 05/29/11 Dr. Milinda Cave has noted that he will not fill meds early and I seriously doubt he will change his mind a week later.  Pt is in agreement and again states her appreciation for tough love and agrees there is no reason to send a note to Dr. Milinda Cave.  We discussed other avenues of pain management and she will continue with those, try Tylenol instead of Motrin and try to space her oxycodone as much as possible until her appt on 06/12/11.  Pt agreeable.

## 2011-06-04 NOTE — Telephone Encounter (Signed)
Agree/Noted--PM 

## 2011-06-12 ENCOUNTER — Ambulatory Visit (INDEPENDENT_AMBULATORY_CARE_PROVIDER_SITE_OTHER): Payer: 59 | Admitting: Family Medicine

## 2011-06-12 ENCOUNTER — Encounter: Payer: Self-pay | Admitting: Family Medicine

## 2011-06-12 DIAGNOSIS — IMO0001 Reserved for inherently not codable concepts without codable children: Secondary | ICD-10-CM

## 2011-06-12 DIAGNOSIS — F329 Major depressive disorder, single episode, unspecified: Secondary | ICD-10-CM

## 2011-06-12 DIAGNOSIS — S2249XA Multiple fractures of ribs, unspecified side, initial encounter for closed fracture: Secondary | ICD-10-CM

## 2011-06-12 DIAGNOSIS — M797 Fibromyalgia: Secondary | ICD-10-CM

## 2011-06-12 DIAGNOSIS — E049 Nontoxic goiter, unspecified: Secondary | ICD-10-CM

## 2011-06-12 DIAGNOSIS — E042 Nontoxic multinodular goiter: Secondary | ICD-10-CM

## 2011-06-12 DIAGNOSIS — D509 Iron deficiency anemia, unspecified: Secondary | ICD-10-CM

## 2011-06-12 DIAGNOSIS — A689 Relapsing fever, unspecified: Secondary | ICD-10-CM

## 2011-06-12 DIAGNOSIS — S2239XA Fracture of one rib, unspecified side, initial encounter for closed fracture: Secondary | ICD-10-CM

## 2011-06-12 DIAGNOSIS — F3289 Other specified depressive episodes: Secondary | ICD-10-CM

## 2011-06-12 DIAGNOSIS — E871 Hypo-osmolality and hyponatremia: Secondary | ICD-10-CM

## 2011-06-12 DIAGNOSIS — R110 Nausea: Secondary | ICD-10-CM

## 2011-06-12 LAB — LIPASE: Lipase: 14 U/L (ref 11.0–59.0)

## 2011-06-12 LAB — CBC WITH DIFFERENTIAL/PLATELET
Basophils Relative: 0.4 % (ref 0.0–3.0)
Eosinophils Relative: 2.1 % (ref 0.0–5.0)
HCT: 32.5 % — ABNORMAL LOW (ref 36.0–46.0)
Hemoglobin: 10.5 g/dL — ABNORMAL LOW (ref 12.0–15.0)
Lymphs Abs: 0.9 10*3/uL (ref 0.7–4.0)
MCV: 76.5 fl — ABNORMAL LOW (ref 78.0–100.0)
Monocytes Absolute: 0.4 10*3/uL (ref 0.1–1.0)
Monocytes Relative: 7.3 % (ref 3.0–12.0)
Platelets: 298 10*3/uL (ref 150.0–400.0)
RBC: 4.26 Mil/uL (ref 3.87–5.11)
WBC: 5.3 10*3/uL (ref 4.5–10.5)

## 2011-06-12 LAB — IBC PANEL
Iron: 74 ug/dL (ref 42–145)
Saturation Ratios: 17.5 % — ABNORMAL LOW (ref 20.0–50.0)
Transferrin: 301.7 mg/dL (ref 212.0–360.0)

## 2011-06-12 LAB — TSH: TSH: 0.79 u[IU]/mL (ref 0.35–5.50)

## 2011-06-12 MED ORDER — CLONAZEPAM 1 MG PO TABS: 1.0000 mg | ORAL_TABLET | ORAL | Status: DC

## 2011-06-12 MED ORDER — OXYCODONE HCL 60 MG PO TB12: 1.0000 | ORAL_TABLET | Freq: Two times a day (BID) | ORAL | Status: DC

## 2011-06-12 MED ORDER — OXYCODONE HCL 5 MG PO TABS: ORAL_TABLET | ORAL | Status: DC

## 2011-06-12 NOTE — Progress Notes (Signed)
OFFICE VISIT  06/14/2011   CC:  Chief Complaint  Patient presents with  . Follow-up     HPI:    Patient is a 45 y.o. Caucasian female who presents for f/u chronic fibromyalgia pain, recurrent fevers, recent rib fracture. Ribs feeling better, just sore now. No febrile episodes since our last f/u 1 mo ago! Pain is unchanged, essentiall every body region, particularly low back, hips, knees, shoulders, sometimes neck. She continues to report a poor level of functioning: can do housework for or so then must rest.  Uses her narcotics to the max, uses heat, ice, and lidocaine patches as well.   Constipation is improved on daily amitiza 24 mcg bid, but she still occasionally must add a dose of miralax to get good evacuation.   Has hx of iron def anemia and reports taking iron 325mg  bid for the last 2 mo.  We have asked her to do hemoccult testing multiple times but she has never complied with this order.  Has distant hx of gastritis secondary to NSAID use.  H. Pylori breath testing neg a couple of months ago. Not taking NSAIDs with any regularity lately.  Has chronic low grade nausea that waxes and wanes (worse lately per her report today) but denies abd pain lately. Her wt is down a few pounds again but now she says she is working on purposefully losing weight (in the past she has complained of undesired wt loss--this historical info seems to be conflicting at times).  Past Medical History  Diagnosis Date  . DDD (degenerative disc disease), lumbar   . Atypical chest pain     Question of coronary vasospasm; cath 10/20/10 NORMAL  . Anxiety   . Fibromyalgia     Dr. Hope Pigeon [phone]  . Interstitial cystitis   . Obesity   . Insomnia   . Transaminasemia 03/2010    (ALT 49), normal on f/u testing off of daily tylenol uuse.  Marland Kitchen GERD (gastroesophageal reflux disease) 06/01/10    Dyspepsia/gastritis, EGD: gastritis (NSAIDS)  . Rectal bleeding 06/01/10    Anorectal  bleeding/BRBPR--colonoscopy: internal hemorrhoids  . Recurrent UTI     renal u/s normal 07/2007  . Weight loss     CT abd/pelf 08/22/10 remakable only for bile duct 9mm (unchanged from 03/2004 PRIOR to ERCP w/spincterotomy, bile duct balloon dredging and lap chole)  . Recurrent fever     W/u unrevealing, including ID consult.    . Asthma   . Osteopenia     bone densitometry 2006 per pt  . 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 Christella Hartigan  . Multinodular goiter 11/2010    TSH normal  . Chest pain   . Rectal prolapse 02/2011    repaired lap [id number]    Past Surgical History  Procedure Date  . Bunionectomy     right foot  . Abdominal hysterectomy   . Bilateral salpingoophorectomy 2003    endometriosis  . Cholecystectomy 2005    lap--(cholelithiasis w/choledocholithiasis on u/s 2005--PreOperative ERCP showed NO stone or other obstruction in the biliary tract, but CBD messured 9mm near head of pancreas.  S sphincterotomy and bile duct balloon dredging was done at that time.  MRCP 10/2010 NORMAL.  Marland Kitchen Orif metatarsal fracture 2006    Right 2nd and 3rd metatarsals (Dr. Romeo Apple)  . Biopsy thyroid 11/2010    Fine needle: NONmalignant goiter  . Rectal prolapse repair, rectopexy [id number]    with sigmoid colectomy    Outpatient  Prescriptions Prior to Visit  Medication Sig Dispense Refill  . albuterol (PROAIR HFA) 108 (90 BASE) MCG/ACT inhaler Inhale 2 puffs into the lungs every 4 (four) hours as needed.        Marland Kitchen amitriptyline (ELAVIL) 150 MG tablet Take 1 tablet (150 mg total) by mouth at bedtime.  30 tablet  5  . amLODipine (NORVASC) 5 MG tablet Take 1 tablet (5 mg total) by mouth daily.  90 tablet  3  . Ascorbic Acid (VITAMIN C) 500 MG tablet Take 500 mg by mouth daily.        . Calcium Carbonate (CALCIUM 500 PO) Take 1 tablet by mouth daily.        . carisoprodol (SOMA) 250 MG tablet TAKE 1 TABLET BY MOUTH THREE TIMES DAILY AS NEEDED  270 tablet  0  . DULoxetine  (CYMBALTA) 60 MG capsule Take 1 capsule (60 mg total) by mouth daily.  90 capsule  5  . esomeprazole (NEXIUM) 40 MG capsule Take 1 capsule (40 mg total) by mouth daily before breakfast.  90 capsule  3  . Fish Oil OIL Take 2 capsules by mouth daily.        . Fluticasone-Salmeterol (ADVAIR DISKUS) 250-50 MCG/DOSE AEPB Inhale 1 puff into the lungs every 12 (twelve) hours.        . ondansetron (ZOFRAN-ODT) 8 MG disintegrating tablet Take 1 tablet (8 mg total) by mouth every 8 (eight) hours as needed. For nausea  20 tablet  3  . polyethylene glycol (MIRALAX / GLYCOLAX) packet Take 17 g by mouth daily.        . promethazine (PHENERGAN) 25 MG tablet Take 1-2 tablets every 6 hours as needed for nausea  90 tablet  3  . valACYclovir (VALTREX) 500 MG tablet 1 tab po bid x 3d prn outbreak  6 tablet  3  . zolpidem (AMBIEN) 10 MG tablet Take 1 tablet (10 mg total) by mouth at bedtime as needed.  90 tablet  1  . clonazePAM (KLONOPIN) 1 MG tablet Take 1 tablet (1 mg total) by mouth as directed. Take 1 tab po q8h  270 tablet  0  . oxyCODONE (OXY IR/ROXICODONE) 5 MG immediate release tablet Take 1-2 tablets by mouth every 4 hours as needed for breakthrough pain  300 tablet  0  . Oxycodone HCl (OXYCONTIN) 60 MG TB12 Take 1 tablet (60 mg total) by mouth 2 (two) times daily. Pain contract in chart  60 each  0    No Known Allergies  ROS As per HPI  PE: Blood pressure 120/84, pulse 102, temperature 97.7 F (36.5 C), temperature source Oral, height 5' 4.5" (1.638 m), weight 171 lb (77.565 kg). Gen: Alert, well appearing.  Patient is oriented to person, place, time, and situation. Gets onto exam table without problem.  Palpation of shoulders and arms elicits no tenderness.  Strength is 5/5 in UEs and LEs bilat. Lungs CTA bilat, nonlabored, equal BS in all fields.  CV: RRR, no m/r/g   LABS:  None today  IMPRESSION AND PLAN:  FIBROMYALGIA, SEVERE Stable.  Subjective report has always been that she is very  debilitated from her pain.   I am very cautious with pain med use with her but have essentially found the use of narcotics unavoidable for her. She is concerned about her disability determination, voices to me on multiple occasions today and in the past that our records need to specifically reflect that she is unable to work. 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 I am going to call her and recommend that she do a functional capacity exam through a local orthopedic/PT agency. Meds RF'd today, no changes.  Rib fractures Right lateral 10th and 11th rib fractures clinically much improved.    NAUSEA Chronic.  I believe this is multifactorial--depression/anxiety, narcotic use chronically. She could have a bit of gastroparesis.   She is no longer having any undesired weight loss. Will check CMET, Lipase, CBC.  No imaging or functional testing indicated at this time.  Multinodular goiter Has been euthyroid.  Bx neg. Time for monitoring of TSH, free T4, and T3 total.  DEPRESSION No appreciable change since increasing amitriptyline to 150mg  qhs. Continue seeing therapist.  Anemia, iron deficiency Gave hemoccult cards again. Check CBC and iron panel + ferritin today. Continue iron pills.  FEVER, RECURRENT Seems quiescent at this time.  Extensive w/u unrevealing. Objective evidence of any disease process to explain this is lacking (specifically, no evidence of infectious disease and no evidence of autoimmune dz).     FOLLOW UP: Return in about 1 month (around 07/12/2011) for f/u chronic pain.

## 2011-06-13 ENCOUNTER — Ambulatory Visit: Payer: 59 | Admitting: Family Medicine

## 2011-06-14 ENCOUNTER — Encounter: Payer: Self-pay | Admitting: Family Medicine

## 2011-06-14 DIAGNOSIS — D509 Iron deficiency anemia, unspecified: Secondary | ICD-10-CM

## 2011-06-14 DIAGNOSIS — S2239XA Fracture of one rib, unspecified side, initial encounter for closed fracture: Secondary | ICD-10-CM | POA: Insufficient documentation

## 2011-06-14 DIAGNOSIS — S2249XA Multiple fractures of ribs, unspecified side, initial encounter for closed fracture: Secondary | ICD-10-CM | POA: Insufficient documentation

## 2011-06-14 HISTORY — DX: Iron deficiency anemia, unspecified: D50.9

## 2011-06-14 NOTE — Assessment & Plan Note (Signed)
Gave hemoccult cards again. Check CBC and iron panel + ferritin today. Continue iron pills.

## 2011-06-14 NOTE — Assessment & Plan Note (Signed)
Right lateral 10th and 11th rib fractures clinically much improved.

## 2011-06-14 NOTE — Assessment & Plan Note (Addendum)
Stable.  Subjective report has always been that she is very debilitated from her pain.   I am very cautious with pain med use with her but have essentially found the use of narcotics unavoidable for her. She is concerned about her disability determination, voices to me on multiple occasions today and in the past that our records need to specifically reflect that she is unable to work. 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 I am going to call her and recommend that she do a functional capacity exam through a local orthopedic/PT agency. Meds RF'd today, no changes.

## 2011-06-14 NOTE — Assessment & Plan Note (Signed)
Has been euthyroid.  Bx neg. Time for monitoring of TSH, free T4, and T3 total.

## 2011-06-14 NOTE — Assessment & Plan Note (Signed)
Seems quiescent at this time.  Extensive w/u unrevealing. Objective evidence of any disease process to explain this is lacking (specifically, no evidence of infectious disease and no evidence of autoimmune dz).

## 2011-06-14 NOTE — Assessment & Plan Note (Signed)
Chronic.  I believe this is multifactorial--depression/anxiety, narcotic use chronically. She could have a bit of gastroparesis.   She is no longer having any undesired weight loss. Will check CMET, Lipase, CBC.  No imaging or functional testing indicated at this time.

## 2011-06-14 NOTE — Assessment & Plan Note (Signed)
No appreciable change since increasing amitriptyline to 150mg  qhs. Continue seeing therapist.

## 2011-06-27 ENCOUNTER — Telehealth: Payer: Self-pay | Admitting: Family Medicine

## 2011-06-27 DIAGNOSIS — M797 Fibromyalgia: Secondary | ICD-10-CM

## 2011-06-27 NOTE — Telephone Encounter (Signed)
Please call [facility] and set up a functional capacity exam ASAP. Dx is fibromyalgia.  Thx--PM

## 2011-06-27 NOTE — Telephone Encounter (Signed)
appt scheduled with Tammy for Dec 11 @8am .  Pt should bring $300 to visit. Pt notified of appt.  Number given to [facility] in case she needs to reschedule appt.  REminded pt she will need to pay up front. Pt states she has received hemoccult cards, but has no developer.  Advised pt we would process when she returns cards to [place].  She is agreeable.

## 2011-07-02 NOTE — Telephone Encounter (Signed)
Referral is in EPIC now.

## 2011-07-02 NOTE — Telephone Encounter (Signed)
Order completed and faxed. 

## 2011-07-10 ENCOUNTER — Ambulatory Visit (INDEPENDENT_AMBULATORY_CARE_PROVIDER_SITE_OTHER): Payer: 59 | Admitting: Family Medicine

## 2011-07-10 ENCOUNTER — Encounter: Payer: Self-pay | Admitting: Family Medicine

## 2011-07-10 DIAGNOSIS — M797 Fibromyalgia: Secondary | ICD-10-CM

## 2011-07-10 DIAGNOSIS — IMO0001 Reserved for inherently not codable concepts without codable children: Secondary | ICD-10-CM

## 2011-07-10 DIAGNOSIS — R109 Unspecified abdominal pain: Secondary | ICD-10-CM

## 2011-07-10 DIAGNOSIS — R110 Nausea: Secondary | ICD-10-CM

## 2011-07-10 DIAGNOSIS — A689 Relapsing fever, unspecified: Secondary | ICD-10-CM

## 2011-07-10 DIAGNOSIS — S2239XA Fracture of one rib, unspecified side, initial encounter for closed fracture: Secondary | ICD-10-CM

## 2011-07-10 DIAGNOSIS — F329 Major depressive disorder, single episode, unspecified: Secondary | ICD-10-CM

## 2011-07-10 DIAGNOSIS — R300 Dysuria: Secondary | ICD-10-CM

## 2011-07-10 DIAGNOSIS — K219 Gastro-esophageal reflux disease without esophagitis: Secondary | ICD-10-CM

## 2011-07-10 LAB — POCT URINALYSIS DIPSTICK
Blood, UA: NEGATIVE
Nitrite, UA: POSITIVE
Spec Grav, UA: 1.015
Urobilinogen, UA: 0.2
pH, UA: 7.5

## 2011-07-10 MED ORDER — OXYCODONE HCL 60 MG PO TB12: 1.0000 | ORAL_TABLET | Freq: Two times a day (BID) | ORAL | Status: DC

## 2011-07-10 MED ORDER — AMITRIPTYLINE HCL 150 MG PO TABS: 150.0000 mg | ORAL_TABLET | Freq: Every day | ORAL | Status: DC

## 2011-07-10 MED ORDER — ESOMEPRAZOLE MAGNESIUM 40 MG PO CPDR: DELAYED_RELEASE_CAPSULE | ORAL | Status: DC

## 2011-07-10 MED ORDER — CARISOPRODOL 250 MG PO TABS: ORAL_TABLET | ORAL | Status: DC

## 2011-07-10 MED ORDER — OXYCODONE HCL 5 MG PO TABS: ORAL_TABLET | ORAL | Status: DC

## 2011-07-10 NOTE — Assessment & Plan Note (Signed)
Problem stable.  Continue current medications and diet appropriate for this condition.  We have reviewed our general long term plan for this problem and also reviewed symptoms and signs that should prompt the patient to call or return to the office. We did decide to refer her to [facility] rheumatology dept for a second opinion on her fibromyalgia syndrome. Pain meds rx's given today.

## 2011-07-10 NOTE — Assessment & Plan Note (Signed)
I believe she had a severe laryngopharyngeal reflux episode recentlly. Has had ongoing unexplainable nausea, work up unrevealing. Gastric emptying study ordered today.  Will also send urine for clx, as cystitis could conceivably make her feel the way she feels.  UA today abnl but she has hx of interstitial cystitis so we'll wait on culture confirmation to treat.

## 2011-07-10 NOTE — Progress Notes (Signed)
OFFICE VISIT  07/12/2011   CC:  Chief Complaint  Patient presents with  . Follow-up    1 month f/u pain     HPI:    Patient is a 45 y.o. Caucasian female who presents for fibromyalgia syndrome. Since last visit 25mo ago she has done fine except for yesterday and today: felt acute onset of nausea yesterday afternoon, describes water brash, malaise, "felt weird", got really anxious and lightheaded.  Felt slight dysuria yesterday, took AZO once.  No fever, no diarrhea.  No URI/cough, no HA.  No vomiting.  No anxiety provoking situations involved.  Last febrile episode was about 2 wks ago, no different than previously described ones. Still having about 3 migraine HA's per week. Musculoskeletal pain not much changed: severe pain in knees, hands, hips, back, 8/10 intensity in AM and then after meds taken goes down to 4/10.  Using rest, ice, heat, lidocaine patches as adjunctive treatments.  Stopped seeing psychiatrist: per pt report today the psychiatrist told her "couldn't help her if she wasn't willing to come once weekly".  Alex said this frequency of visits was not financially viable for her.    Ribs feel fine where she recently had fractures. Constipation fine on amitiza and prn miralax but she complains of the sticky texture of her BMs on amitiza.    Past Medical History  Diagnosis Date  . DDD (degenerative disc disease), lumbar   . Atypical chest pain     Question of coronary vasospasm; cath 10/20/10 NORMAL  . Anxiety   . Fibromyalgia     Dr. Hope Pigeon [phone]  . Interstitial cystitis   . Obesity   . Insomnia   . Transaminasemia 03/2010    (ALT 49), normal on f/u testing off of daily tylenol uuse.  Marland Kitchen GERD (gastroesophageal reflux disease) 06/01/10    Dyspepsia/gastritis, EGD: gastritis (NSAIDS)  . Rectal bleeding 06/01/10    Anorectal bleeding/BRBPR--colonoscopy: internal hemorrhoids  . Recurrent UTI     renal u/s normal 07/2007  . Weight loss     CT abd/pelf  08/22/10 remakable only for bile duct 9mm (unchanged from 03/2004 PRIOR to ERCP w/spincterotomy, bile duct balloon dredging and lap chole)  . Recurrent fever     W/u unrevealing, including ID consult.    . Asthma   . Osteopenia     bone densitometry 2006 per pt  . 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 Christella Hartigan  . Multinodular goiter 11/2010    TSH normal  . Chest pain   . Rectal prolapse 02/2011    repaired lap [id number]    Past Surgical History  Procedure Date  . Bunionectomy     right foot  . Abdominal hysterectomy   . Bilateral salpingoophorectomy 2003    endometriosis  . Cholecystectomy 2005    lap--(cholelithiasis w/choledocholithiasis on u/s 2005--PreOperative ERCP showed NO stone or other obstruction in the biliary tract, but CBD messured 9mm near head of pancreas.  S sphincterotomy and bile duct balloon dredging was done at that time.  MRCP 10/2010 NORMAL.  Marland Kitchen Orif metatarsal fracture 2006    Right 2nd and 3rd metatarsals (Dr. Romeo Apple)  . Biopsy thyroid 11/2010    Fine needle: NONmalignant goiter  . Rectal prolapse repair, rectopexy 05July2012    with sigmoid colectomy    Outpatient Prescriptions Prior to Visit  Medication Sig Dispense Refill  . albuterol (PROAIR HFA) 108 (90 BASE) MCG/ACT inhaler Inhale 2 puffs into the lungs every 4 (four) hours  as needed.        Marland Kitchen amLODipine (NORVASC) 5 MG tablet Take 1 tablet (5 mg total) by mouth daily.  90 tablet  3  . Ascorbic Acid (VITAMIN C) 500 MG tablet Take 500 mg by mouth daily.        . Calcium Carbonate (CALCIUM 500 PO) Take 1 tablet by mouth daily.        . clonazePAM (KLONOPIN) 1 MG tablet Take 1 tablet (1 mg total) by mouth as directed. Take 1 tab po q8h  270 tablet  0  . Fish Oil OIL Take 2 capsules by mouth daily.        . Fluticasone-Salmeterol (ADVAIR DISKUS) 250-50 MCG/DOSE AEPB Inhale 1 puff into the lungs every 12 (twelve) hours.        . ondansetron (ZOFRAN-ODT) 8 MG disintegrating tablet  Take 1 tablet (8 mg total) by mouth every 8 (eight) hours as needed. For nausea  20 tablet  3  . polyethylene glycol (MIRALAX / GLYCOLAX) packet Take 17 g by mouth daily.        . promethazine (PHENERGAN) 25 MG tablet Take 1-2 tablets every 6 hours as needed for nausea  90 tablet  3  . valACYclovir (VALTREX) 500 MG tablet 1 tab po bid x 3d prn outbreak  6 tablet  3  . zolpidem (AMBIEN) 10 MG tablet Take 1 tablet (10 mg total) by mouth at bedtime as needed.  90 tablet  1  . amitriptyline (ELAVIL) 150 MG tablet Take 1 tablet (150 mg total) by mouth at bedtime.  30 tablet  5  . carisoprodol (SOMA) 250 MG tablet TAKE 1 TABLET BY MOUTH THREE TIMES DAILY AS NEEDED  270 tablet  0  . esomeprazole (NEXIUM) 40 MG capsule Take 1 capsule (40 mg total) by mouth daily before breakfast.  90 capsule  3  . oxyCODONE (OXY IR/ROXICODONE) 5 MG immediate release tablet Take 1-2 tablets by mouth every 4 hours as needed for breakthrough pain  300 tablet  0  . Oxycodone HCl (OXYCONTIN) 60 MG TB12 Take 1 tablet (60 mg total) by mouth 2 (two) times daily. Pain contract in chart  60 each  0  . DULoxetine (CYMBALTA) 60 MG capsule Take 1 capsule (60 mg total) by mouth daily.  90 capsule  5    No Known Allergies  ROS As per HPI  PE: Blood pressure 124/81, pulse 105, temperature 98 F (36.7 C), temperature source Oral, height 5' 4.5" (1.638 m), weight 175 lb (79.379 kg), SpO2 99.00%. Gen: Alert, tired appearing, tearful at times.  Patient is oriented to person, place, time, and situation.   ENT: nose is clear, eyes without injection/swelling/icterus. Nose with mild rhinorrhea.   Lips an gingiva normal.  Hard palate with a few splotches of erythema but no swelling or vesicles or ulcerations.   Posterior pharynx without erythema, swelling, or exudate. Neck: no LAD, TM, or tenderness. CV: Regular, tachy to 110, no m/r/g.   LUNGS: CTA bilat, nonlabored resps, good aeration in all lung fields. ABD: soft, nondistended,  diffuse TTP.  BS normal. EXT: no clubbing, cyanosis, or edema.  Joints: no erythema, warmth, or swelling. She has TTP over soft tissues of shoulders, back, low back, lateral portions of both hips, both thighs.  LABS:  UA today showed +nitrite and trace LEU (pt took AZO yesterday, but urine color today was yellow), otherwise neg.  Lab Results  Component Value Date   WBC 5.3 06/12/2011   HGB  10.5* 06/12/2011   HCT 32.5* 06/12/2011   MCV 76.5* 06/12/2011   PLT 298.0 06/12/2011   Lab Results  Component Value Date   TSH 0.79 06/12/2011   Lab Results  Component Value Date   IRON 74 06/12/2011   FERRITIN 8.2* 06/12/2011     IMPRESSION AND PLAN:  FIBROMYALGIA, SEVERE Problem stable.  Continue current medications and diet appropriate for this condition.  We have reviewed our general long term plan for this problem and also reviewed symptoms and signs that should prompt the patient to call or return to the office. We did decide to refer her to [facility] rheumatology dept for a second opinion on her fibromyalgia syndrome. Pain meds rx's given today.  NAUSEA I believe she had a severe laryngopharyngeal reflux episode recentlly. Has had ongoing unexplainable nausea, work up unrevealing. Gastric emptying study ordered today.  Will also send urine for clx, as cystitis could conceivably make her feel the way she feels.  UA today abnl but she has hx of interstitial cystitis so we'll wait on culture confirmation to treat.  DEPRESSION It appears she won't continue seeing her psychiatrist, unfortunately, due to financial constraints/philisophical differences, etc.??  FEVER, RECURRENT Only one episode in the last 51mo. Extensive w/u unrevealing.  Rib fractures MUCH improved.  Pain essentially is resolved.   Constipation: fairly stable on amitiza 24 mcg bid and prn miralax.  She'll have to decide if sticky texture of stool since getting on amitiza is tolerable.  Anemia, iron def: hx of NSAID  induced upper GI bleed.  SHe is off NSAIDs now. We've really had a tough time getting her to do hemoccult testing but she said today that she did them and mailed them in to the lab so we'll expect results soon. Continue iron supplementation.    FOLLOW UP: Return in about 1 month (around 08/10/2011) for f/u chronic pain, GI issues.

## 2011-07-12 LAB — URINE CULTURE: Colony Count: 4000

## 2011-07-12 NOTE — Assessment & Plan Note (Signed)
It appears she won't continue seeing her psychiatrist, unfortunately, due to financial constraints/philisophical differences, etc.??

## 2011-07-12 NOTE — Assessment & Plan Note (Signed)
MUCH improved.  Pain essentially is resolved.

## 2011-07-12 NOTE — Assessment & Plan Note (Signed)
Only one episode in the last 51mo. Extensive w/u unrevealing.

## 2011-07-18 ENCOUNTER — Encounter (HOSPITAL_COMMUNITY): Payer: 59

## 2011-07-19 ENCOUNTER — Telehealth: Payer: Self-pay | Admitting: *Deleted

## 2011-07-19 NOTE — Telephone Encounter (Signed)
Received message from pt that she has found out her portion of the "nuclear swallowing" test that she has scheduled. Wants to know what will happen if test is positive and how will it change things. Also reports persistent nausea, headache and intermittent fever of 99. Please advise.

## 2011-07-23 ENCOUNTER — Other Ambulatory Visit: Payer: Self-pay | Admitting: *Deleted

## 2011-07-23 MED ORDER — POLYETHYLENE GLYCOL 3350 17 GM/SCOOP PO POWD: 17.0000 g | Freq: Every day | ORAL | Status: DC

## 2011-07-23 NOTE — Telephone Encounter (Signed)
If her gastric emptying scan is abnormal then I will need her to see a gastroenterologist.

## 2011-07-23 NOTE — Telephone Encounter (Signed)
Pt called wanting 3 month supply of miralax as this will be cheaper.  RX sent.

## 2011-07-24 NOTE — Telephone Encounter (Signed)
Questions answered during refill call on 07/23/11.

## 2011-08-08 ENCOUNTER — Encounter: Payer: Self-pay | Admitting: Family Medicine

## 2011-08-08 ENCOUNTER — Ambulatory Visit (INDEPENDENT_AMBULATORY_CARE_PROVIDER_SITE_OTHER): Payer: 59 | Admitting: Family Medicine

## 2011-08-08 DIAGNOSIS — G8929 Other chronic pain: Secondary | ICD-10-CM | POA: Insufficient documentation

## 2011-08-08 DIAGNOSIS — R110 Nausea: Secondary | ICD-10-CM

## 2011-08-08 DIAGNOSIS — A689 Relapsing fever, unspecified: Secondary | ICD-10-CM

## 2011-08-08 DIAGNOSIS — E042 Nontoxic multinodular goiter: Secondary | ICD-10-CM

## 2011-08-08 DIAGNOSIS — R51 Headache: Secondary | ICD-10-CM

## 2011-08-08 DIAGNOSIS — D509 Iron deficiency anemia, unspecified: Secondary | ICD-10-CM

## 2011-08-08 DIAGNOSIS — IMO0001 Reserved for inherently not codable concepts without codable children: Secondary | ICD-10-CM

## 2011-08-08 DIAGNOSIS — M797 Fibromyalgia: Secondary | ICD-10-CM

## 2011-08-08 MED ORDER — OXYCODONE HCL 10 MG PO TABS: ORAL_TABLET | ORAL | Status: DC

## 2011-08-08 MED ORDER — AMITRIPTYLINE HCL 150 MG PO TABS: 150.0000 mg | ORAL_TABLET | Freq: Every day | ORAL | Status: DC

## 2011-08-08 MED ORDER — SUMATRIPTAN SUCCINATE 50 MG PO TABS: ORAL_TABLET | ORAL | Status: DC

## 2011-08-08 MED ORDER — OXYCODONE HCL 5 MG PO TABS: ORAL_TABLET | ORAL | Status: DC

## 2011-08-08 MED ORDER — PROMETHAZINE HCL 25 MG PO TABS: ORAL_TABLET | ORAL | Status: DC

## 2011-08-08 MED ORDER — OXYCODONE HCL 60 MG PO TB12: 1.0000 | ORAL_TABLET | Freq: Two times a day (BID) | ORAL | Status: DC

## 2011-08-08 MED ORDER — ONDANSETRON 8 MG PO TBDP: 8.0000 mg | ORAL_TABLET | Freq: Three times a day (TID) | ORAL | Status: DC | PRN

## 2011-08-08 NOTE — Assessment & Plan Note (Signed)
Chronic, with various other GI sx's interspersed (epigastric pain, GERD, regurgitations, constipation). Lab and radiographic w/u unrevealing. Continue phenergan and zofran and PPI. She is scheduled for a gastric emptying scan soon.

## 2011-08-08 NOTE — Assessment & Plan Note (Signed)
Tension most of the time, but she has periods of migraines. Has not been on a trial of triptan per pt. Will try generic sumatriptan 50mg , 1-2 tabs q2h prn HA, #9, RF x 3.  Therapeutic expectations and side effect profile of medication discussed today.  Patient's questions answered.

## 2011-08-08 NOTE — Assessment & Plan Note (Signed)
Problem stable.  Continue current medications and diet appropriate for this condition.  We have reviewed our general long term plan for this problem and also reviewed symptoms and signs that should prompt the patient to call or return to the office. Full w/u unrevealing.  We are continuing periodic lab surveillance--we'll order these for [facility] in [place] due to our phlebotomist not being here today.

## 2011-08-08 NOTE — Progress Notes (Signed)
2OFFICE VISIT  08/08/2011   CC:  Chief Complaint  Patient presents with  . Follow-up     HPI:    Patient is a 46 y.o. Caucasian female who presents for routine fibromyalgia f/u. Reports 3 weeks of migraine headaches, nausea, photophobia--seemed like daily.  She has never been put on a triptan but has been tried on several different migraine prophylaxis meds.  Now she is back to having her "normal" headaches".  Has had one fever episode since last f/u 1 mo ago-typical temp to 103, nausea, abd pains, musculoskeletal pains--this was 2d/a and she still feels it (except the fever).  Musculoskeletal pains stable except neck seems worse, plans on going to see Dr. Consuella Lose 08/2011 and plans on asking for injection.  Notes 1 wk hx of left hand bruise near base of thumb, started at little red bump and then bruised and swelled around it some.  Red bump disappeared and now only feels sore and looks like a bruise with little red dot in center. No insect/spider was seen.  Past Medical History  Diagnosis Date  . DDD (degenerative disc disease), lumbar   . Atypical chest pain     Question of coronary vasospasm; cath 10/20/10 NORMAL  . Anxiety   . Fibromyalgia     Dr. Hope Pigeon [phone]  . Interstitial cystitis   . Obesity   . Insomnia   . Transaminasemia 03/2010    (ALT 49), normal on f/u testing off of daily tylenol uuse.  Marland Kitchen GERD (gastroesophageal reflux disease) 06/01/10    Dyspepsia/gastritis, EGD: gastritis (NSAIDS)  . Rectal bleeding 06/01/10    Anorectal bleeding/BRBPR--colonoscopy: internal hemorrhoids  . Recurrent UTI     renal u/s normal 07/2007  . Weight loss     CT abd/pelf 08/22/10 remakable only for bile duct 9mm (unchanged from 03/2004 PRIOR to ERCP w/spincterotomy, bile duct balloon dredging and lap chole)  . Recurrent fever     W/u unrevealing, including ID consult.    . Asthma   . Osteopenia     bone densitometry 2006 per pt  . Gastritis due to nonsteroidal  anti-inflammatory drug 05/2010    Dr. Christella Hartigan  . Multinodular goiter 11/2010    TSH normal  . Chest pain   . Rectal prolapse 02/2011    repaired lap [id number]    Past Surgical History  Procedure Date  . Bunionectomy     right foot  . Abdominal hysterectomy   . Bilateral salpingoophorectomy 2003    endometriosis  . Cholecystectomy 2005    lap--(cholelithiasis w/choledocholithiasis on u/s 2005--PreOperative ERCP showed NO stone or other obstruction in the biliary tract, but CBD messured 9mm near head of pancreas.  S sphincterotomy and bile duct balloon dredging was done at that time.  MRCP 10/2010 NORMAL.  Marland Kitchen Orif metatarsal fracture 2006    Right 2nd and 3rd metatarsals (Dr. Romeo Apple)  . Biopsy thyroid 11/2010    Fine needle: NONmalignant goiter  . Rectal prolapse repair, rectopexy 05July2012    with sigmoid colectomy    Outpatient Prescriptions Prior to Visit  Medication Sig Dispense Refill  . albuterol (PROAIR HFA) 108 (90 BASE) MCG/ACT inhaler Inhale 2 puffs into the lungs every 4 (four) hours as needed.        Marland Kitchen amLODipine (NORVASC) 5 MG tablet Take 1 tablet (5 mg total) by mouth daily.  90 tablet  3  . Ascorbic Acid (VITAMIN C) 500 MG tablet Take 500 mg by mouth daily.        Marland Kitchen  Calcium Carbonate (CALCIUM 500 PO) Take 1 tablet by mouth daily.        . carisoprodol (SOMA) 250 MG tablet 1 tab po tid prn  270 tablet  0  . clonazePAM (KLONOPIN) 1 MG tablet Take 1 tablet (1 mg total) by mouth as directed. Take 1 tab po q8h  270 tablet  0  . esomeprazole (NEXIUM) 40 MG capsule 1 tab po bid  180 capsule  3  . Fish Oil OIL Take 2 capsules by mouth daily.        . Fluticasone-Salmeterol (ADVAIR DISKUS) 250-50 MCG/DOSE AEPB Inhale 1 puff into the lungs every 12 (twelve) hours.        . polyethylene glycol powder (GLYCOLAX/MIRALAX) powder Take 17 g by mouth daily.  1530 g  3  . valACYclovir (VALTREX) 500 MG tablet 1 tab po bid x 3d prn outbreak  6 tablet  3  . zolpidem (AMBIEN) 10 MG tablet  Take 1 tablet (10 mg total) by mouth at bedtime as needed.  90 tablet  1  . amitriptyline (ELAVIL) 150 MG tablet Take 1 tablet (150 mg total) by mouth at bedtime.  90 tablet  3  . ondansetron (ZOFRAN-ODT) 8 MG disintegrating tablet Take 1 tablet (8 mg total) by mouth every 8 (eight) hours as needed. For nausea  20 tablet  3  . oxyCODONE (OXY IR/ROXICODONE) 5 MG immediate release tablet Take 1-2 tablets by mouth every 4 hours as needed for breakthrough pain  300 tablet  0  . Oxycodone HCl (OXYCONTIN) 60 MG TB12 Take 1 tablet (60 mg total) by mouth 2 (two) times daily. Pain contract in chart  60 each  0  . promethazine (PHENERGAN) 25 MG tablet Take 1-2 tablets every 6 hours as needed for nausea  90 tablet  3  . polyethylene glycol powder (GLYCOLAX/MIRALAX) powder Take 17 g by mouth daily.  255 g  0  . polyethylene glycol powder (MIRALAX) powder Take 255 g by mouth once.  255 g  0    No Known Allergies  ROS As per HPI  PE: Blood pressure 128/83, pulse 95, height 5' 4.5" (1.638 m), weight 164 lb (74.39 kg), SpO2 99.00%. Gen: Alert, well appearing.  Patient is oriented to person, place, time, and situation. ENT: Ears: EACs clear, normal epithelium.  TMs with good light reflex and landmarks bilaterally.  Eyes: no injection, icteris, swelling, or exudate.  EOMI, PERRLA. Nose: no drainage or turbinate edema/swelling.  No injection or focal lesion.  Mouth: lips without lesion/swelling.  Oral mucosa pink and moist.  Dentition intact and without obvious caries or gingival swelling.  Oropharynx without erythema, exudate, or swelling.  Neck - No masses or thyromegaly or limitation in range of motion CV: RRR, no m/r/g.   LUNGS: CTA bilat, nonlabored resps, good aeration in all lung fields. EXT: no clubbing, cyanosis, or edema.   LABS:  None today  IMPRESSION AND PLAN:  FIBROMYALGIA, SEVERE Problem stable.  Continue current medications and diet appropriate for this condition.  We have reviewed our  general long term plan for this problem and also reviewed symptoms and signs that should prompt the patient to call or return to the office. Rx's RF'd, printed, handed to pt today.  FEVER, RECURRENT Problem stable.  Continue current medications and diet appropriate for this condition.  We have reviewed our general long term plan for this problem and also reviewed symptoms and signs that should prompt the patient to call or return to the  office. Full w/u unrevealing.  We are continuing periodic lab surveillance--we'll order these for [facility] in [place] due to our phlebotomist not being here today.  Anemia, iron deficiency Still no hemoccult cards returned by pt today. She is on iron replacement. Recheck CBC, IBC panel, and ferritin.  NAUSEA Chronic, with various other GI sx's interspersed (epigastric pain, GERD, regurgitations, constipation). Lab and radiographic w/u unrevealing. Continue phenergan and zofran and PPI. She is scheduled for a gastric emptying scan soon.  Multinodular goiter She has been euthyroid. Detected on CT chest done for r/o PE 11/2010, then f/u u/s confirmed multinodular thyroid.  Due to size of a few of the nodules/cysts plus one nearby 1.3 cm mediastinal lymph node, an ultrasounded guided biopsy was recommended to r/o malignancy. FN biopsy showed NONmalignant goiter.  Thyroid peroxidase antibody NEG. We are monitoring TSH periodically.  Recheck TSH today.  Chronic headaches Tension most of the time, but she has periods of migraines. Has not been on a trial of triptan per pt. Will try generic sumatriptan 50mg , 1-2 tabs q2h prn HA, #9, RF x 3.  Therapeutic expectations and side effect profile of medication discussed today.  Patient's questions answered.      FOLLOW UP: Return in about 1 month (around 09/08/2011) for f/u fibromyalgia.

## 2011-08-08 NOTE — Assessment & Plan Note (Signed)
Problem stable.  Continue current medications and diet appropriate for this condition.  We have reviewed our general long term plan for this problem and also reviewed symptoms and signs that should prompt the patient to call or return to the office. Rx's RF'd, printed, handed to pt today.

## 2011-08-08 NOTE — Assessment & Plan Note (Signed)
Still no hemoccult cards returned by pt today. She is on iron replacement. Recheck CBC, IBC panel, and ferritin.

## 2011-08-08 NOTE — Assessment & Plan Note (Signed)
She has been euthyroid. Detected on CT chest done for r/o PE 11/2010, then f/u u/s confirmed multinodular thyroid.  Due to size of a few of the nodules/cysts plus one nearby 1.3 cm mediastinal lymph node, an ultrasounded guided biopsy was recommended to r/o malignancy. FN biopsy showed NONmalignant goiter.  Thyroid peroxidase antibody NEG. We are monitoring TSH periodically.  Recheck TSH today.

## 2011-08-09 ENCOUNTER — Encounter (HOSPITAL_COMMUNITY): Payer: 59

## 2011-08-11 ENCOUNTER — Encounter: Payer: Self-pay | Admitting: Family Medicine

## 2011-08-17 ENCOUNTER — Other Ambulatory Visit: Payer: Self-pay | Admitting: Family Medicine

## 2011-08-17 ENCOUNTER — Telehealth: Payer: Self-pay | Admitting: *Deleted

## 2011-08-17 NOTE — Telephone Encounter (Signed)
Pt is in [place] and would like orders sent there.  Orders faxed to [phone].

## 2011-08-18 ENCOUNTER — Encounter: Payer: Self-pay | Admitting: Family Medicine

## 2011-08-18 DIAGNOSIS — K909 Intestinal malabsorption, unspecified: Secondary | ICD-10-CM | POA: Insufficient documentation

## 2011-08-18 LAB — COMPREHENSIVE METABOLIC PANEL
AST: 33 U/L (ref 0–37)
Alkaline Phosphatase: 101 U/L (ref 39–117)
BUN: 8 mg/dL (ref 6–23)
Glucose, Bld: 109 mg/dL — ABNORMAL HIGH (ref 70–99)
Sodium: 129 mEq/L — ABNORMAL LOW (ref 135–145)
Total Bilirubin: 0.3 mg/dL (ref 0.3–1.2)

## 2011-08-18 LAB — CBC WITH DIFFERENTIAL/PLATELET
Basophils Absolute: 0 10*3/uL (ref 0.0–0.1)
Eosinophils Relative: 2 % (ref 0–5)
Lymphocytes Relative: 15 % (ref 12–46)
MCV: 78.9 fL (ref 78.0–100.0)
Neutrophils Relative %: 78 % — ABNORMAL HIGH (ref 43–77)
Platelets: 308 10*3/uL (ref 150–400)
RBC: 4.17 MIL/uL (ref 3.87–5.11)
RDW: 20.4 % — ABNORMAL HIGH (ref 11.5–15.5)
WBC: 6.7 10*3/uL (ref 4.0–10.5)

## 2011-08-18 LAB — IBC PANEL
%SAT: 18 % — ABNORMAL LOW (ref 20–55)
TIBC: 429 ug/dL (ref 250–470)

## 2011-08-18 LAB — IRON: Iron: 78 ug/dL (ref 42–145)

## 2011-08-18 LAB — TSH: TSH: 0.993 u[IU]/mL (ref 0.350–4.500)

## 2011-08-20 ENCOUNTER — Encounter: Payer: Self-pay | Admitting: Family Medicine

## 2011-08-20 ENCOUNTER — Other Ambulatory Visit: Payer: Self-pay | Admitting: Family Medicine

## 2011-08-20 DIAGNOSIS — E559 Vitamin D deficiency, unspecified: Secondary | ICD-10-CM | POA: Insufficient documentation

## 2011-08-20 LAB — ANA: Anti Nuclear Antibody(ANA): POSITIVE — AB

## 2011-08-20 LAB — TISSUE TRANSGLUTAMINASE, IGA: Tissue Transglutaminase Ab, IgA: 7.9 U/mL (ref <20)

## 2011-08-20 LAB — ANTI-NUCLEAR AB-TITER (ANA TITER): ANA Titer 1: NEGATIVE

## 2011-08-20 LAB — VITAMIN D 25 HYDROXY (VIT D DEFICIENCY, FRACTURES): Vit D, 25-Hydroxy: <10 ng/mL — ABNORMAL LOW (ref 30–89)

## 2011-08-20 MED ORDER — VITAMIN D (ERGOCALCIFEROL) 1.25 MG (50000 UNIT) PO CAPS: 50000.0000 [IU] | ORAL_CAPSULE | ORAL | Status: DC

## 2011-08-22 ENCOUNTER — Other Ambulatory Visit: Payer: 59

## 2011-08-22 DIAGNOSIS — D649 Anemia, unspecified: Secondary | ICD-10-CM

## 2011-08-22 LAB — HEMOCCULT SLIDES (X 3 CARDS): OCCULT 1: NEGATIVE

## 2011-08-23 NOTE — Progress Notes (Signed)
Quick Note:  Ok to try 2 iron tabs a day and hold off on iron infusion and GI referral at this time. Recheck labs in 6 wks to see if her iron levels/reticulocyte count are responding appropriately. If the repeat labs still show no change then I'll order referral back to GI for further e/m of possible malabsorption syndrome. (This 6 wk lab recheck can be done while here at a routine f/u visit--not the f/u on 1/31 but the f/u that will be a month after that one.) ______

## 2011-08-28 ENCOUNTER — Encounter (HOSPITAL_COMMUNITY)
Admission: RE | Admit: 2011-08-28 | Discharge: 2011-08-28 | Disposition: A | Discharge status: Home / Self Care | Payer: 59 | Source: Ambulatory Visit | Attending: Family Medicine | Admitting: Family Medicine

## 2011-08-28 ENCOUNTER — Encounter (HOSPITAL_COMMUNITY): Payer: Self-pay

## 2011-08-28 DIAGNOSIS — R110 Nausea: Secondary | ICD-10-CM | POA: Insufficient documentation

## 2011-08-28 DIAGNOSIS — K219 Gastro-esophageal reflux disease without esophagitis: Secondary | ICD-10-CM

## 2011-08-28 DIAGNOSIS — R109 Unspecified abdominal pain: Secondary | ICD-10-CM | POA: Insufficient documentation

## 2011-08-28 MED ORDER — TECHNETIUM TC 99M SULFUR COLLOID
2.0000 | Freq: Once | INTRAVENOUS | Status: AC | PRN
  Administered 2011-08-28: 2 via ORAL

## 2011-09-06 ENCOUNTER — Ambulatory Visit (INDEPENDENT_AMBULATORY_CARE_PROVIDER_SITE_OTHER): Payer: 59 | Admitting: Family Medicine

## 2011-09-06 ENCOUNTER — Encounter: Payer: Self-pay | Admitting: Family Medicine

## 2011-09-06 DIAGNOSIS — IMO0001 Reserved for inherently not codable concepts without codable children: Secondary | ICD-10-CM

## 2011-09-06 DIAGNOSIS — A689 Relapsing fever, unspecified: Secondary | ICD-10-CM

## 2011-09-06 DIAGNOSIS — D509 Iron deficiency anemia, unspecified: Secondary | ICD-10-CM

## 2011-09-06 DIAGNOSIS — R51 Headache: Secondary | ICD-10-CM

## 2011-09-06 DIAGNOSIS — M797 Fibromyalgia: Secondary | ICD-10-CM

## 2011-09-06 DIAGNOSIS — E042 Nontoxic multinodular goiter: Secondary | ICD-10-CM

## 2011-09-06 DIAGNOSIS — R110 Nausea: Secondary | ICD-10-CM

## 2011-09-06 MED ORDER — OXYCODONE HCL 10 MG PO TABS: ORAL_TABLET | ORAL | Status: DC

## 2011-09-06 MED ORDER — CLONAZEPAM 1 MG PO TABS: 1.0000 mg | ORAL_TABLET | ORAL | Status: DC

## 2011-09-06 MED ORDER — OXYCODONE HCL 60 MG PO TB12: 1.0000 | ORAL_TABLET | Freq: Two times a day (BID) | ORAL | Status: DC

## 2011-09-06 MED ORDER — POLYETHYLENE GLYCOL 3350 17 GM/SCOOP PO POWD: ORAL | Status: DC

## 2011-09-06 NOTE — Assessment & Plan Note (Signed)
Chronic, unclear etiology. Gastric emptying study this month was normal. Continue anti-emetics. GI referral likely soon, esp if iron levels continue to stay down despite bid iron.

## 2011-09-06 NOTE — Progress Notes (Signed)
OFFICE VISIT  09/06/2011   CC:  Chief Complaint  Patient presents with  . Follow-up     HPI:    Patient is a 46 y.o. Caucasian female who presents for f/u chronic pain/fibromyalgia, depression, migraines, hx of recurrent febrile episodes of unknown etiology, and iron def anemia without evidence of GI, urinary, or vaginal blood loss. Pain is about as per her usual: mildly improved with taking her current level of narcotics and muscle relaxers, using ice alt/w heat.  She is still only able to do light housework daily.  Worst locations are upper and lower back, hips, thighs, and lately her left thumb area near radial styloid.  No swelling or redness or warmth of any joints.  No rash.  No oral ulcers. Had myofascial steroid injections in upper back region at [facility] recently, helped some.  Had another weeklong episode of migraines, imitrex trial helped minimally.  These have now resolved.  Has had two febrile episodes since I last saw her a month ago, most recent was yesterday: classic sx's for her--T 102, worsened nausea, worsened full body pain, malaise, resolved as of today.  Her chronic nausea and early satiety problems continue.  Gastric emptying scan earlier this month was normal.    Past Medical History  Diagnosis Date  . DDD (degenerative disc disease), lumbar   . Atypical chest pain     Question of coronary vasospasm; cath 10/20/10 NORMAL  . Anxiety   . Fibromyalgia     Dr. Hope Pigeon [phone]  . Interstitial cystitis   . Obesity   . Insomnia   . Transaminasemia 03/2010    (ALT 49), normal on f/u testing off of daily tylenol uuse.  Marland Kitchen GERD (gastroesophageal reflux disease) 06/01/10    Dyspepsia/gastritis, EGD: gastritis (NSAIDS)  . Rectal bleeding 06/01/10    Anorectal bleeding/BRBPR--colonoscopy: internal hemorrhoids  . Recurrent UTI     renal u/s normal 07/2007  . Weight loss     CT abd/pelf 08/22/10 remakable only for bile duct 9mm (unchanged from 03/2004 PRIOR to ERCP  w/spincterotomy, bile duct balloon dredging and lap chole)  . Recurrent fever     W/u unrevealing, including ID consult.    . Asthma   . Osteopenia     bone densitometry 2006 per pt  . 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 Christella Hartigan  . Multinodular goiter 11/2010    TSH normal  . Chest pain   . Rectal prolapse 02/2011    repaired lap [id number]  . Vitamin d deficiency 08/20/2011    Past Surgical History  Procedure Date  . Bunionectomy     right foot  . Abdominal hysterectomy   . Bilateral salpingoophorectomy 2003    endometriosis  . Cholecystectomy 2005    lap--(cholelithiasis w/choledocholithiasis on u/s 2005--PreOperative ERCP showed NO stone or other obstruction in the biliary tract, but CBD messured 9mm near head of pancreas.  S sphincterotomy and bile duct balloon dredging was done at that time.  MRCP 10/2010 NORMAL.  Marland Kitchen Orif metatarsal fracture 2006    Right 2nd and 3rd metatarsals (Dr. Romeo Apple)  . Biopsy thyroid 11/2010    Fine needle: NONmalignant goiter  . Rectal prolapse repair, rectopexy 05July2012    with sigmoid colectomy    Outpatient Prescriptions Prior to Visit  Medication Sig Dispense Refill  . albuterol (PROAIR HFA) 108 (90 BASE) MCG/ACT inhaler Inhale 2 puffs into the lungs every 4 (four) hours as needed.        Marland Kitchen  amitriptyline (ELAVIL) 150 MG tablet Take 1 tablet (150 mg total) by mouth at bedtime.  90 tablet  3  . amLODipine (NORVASC) 5 MG tablet Take 1 tablet (5 mg total) by mouth daily.  90 tablet  3  . Ascorbic Acid (VITAMIN C) 500 MG tablet Take 500 mg by mouth daily.        . Calcium Carbonate (CALCIUM 500 PO) Take 1 tablet by mouth daily.        . carisoprodol (SOMA) 250 MG tablet 1 tab po tid prn  270 tablet  0  . esomeprazole (NEXIUM) 40 MG capsule 1 tab po bid  180 capsule  3  . Fish Oil OIL Take 2 capsules by mouth daily.        . Fluticasone-Salmeterol (ADVAIR DISKUS) 250-50 MCG/DOSE AEPB Inhale 1 puff into the lungs every  12 (twelve) hours.        . ondansetron (ZOFRAN-ODT) 8 MG disintegrating tablet Take 1 tablet (8 mg total) by mouth every 8 (eight) hours as needed. For nausea  20 tablet  3  . promethazine (PHENERGAN) 25 MG tablet Take 1-2 tablets every 6 hours as needed for nausea  90 tablet  3  . SUMAtriptan (IMITREX) 50 MG tablet 1-2 tabs po q2h prn migraine HA, max in 24h is 300 mg  9 tablet  3  . valACYclovir (VALTREX) 500 MG tablet 1 tab po bid x 3d prn outbreak  6 tablet  3  . Vitamin D, Ergocalciferol, (DRISDOL) 50000 UNITS CAPS Take 1 capsule (50,000 Units total) by mouth every 7 (seven) days.  12 capsule  1  . zolpidem (AMBIEN) 10 MG tablet Take 1 tablet (10 mg total) by mouth at bedtime as needed.  90 tablet  1  . clonazePAM (KLONOPIN) 1 MG tablet Take 1 tablet (1 mg total) by mouth as directed. Take 1 tab po q8h  270 tablet  0  . oxyCODONE 10 MG TABS Take 1/2-1 tablets by mouth every 4 hours as needed for breakthrough pain  150 tablet  0  . Oxycodone HCl (OXYCONTIN) 60 MG TB12 Take 1 tablet (60 mg total) by mouth 2 (two) times daily. Pain contract in chart  60 each  0  . polyethylene glycol powder (GLYCOLAX/MIRALAX) powder Take 17 g by mouth daily.  1530 g  3    No Known Allergies  ROS As per HPI  PE: Blood pressure 130/84, pulse 84, resp. rate 16, weight 164 lb (74.39 kg). Gen: Alert, well appearing.  Patient is oriented to person, place, time, and situation. Pleasant affect.  Lucid thought and conversation.  No further exam today.  LABS:  None today  IMPRESSION AND PLAN:  FIBROMYALGIA, SEVERE Stable.  Continue current measures: ice/heat, muscle relaxers, pain meds.  RFs given today.  FEVER, RECURRENT Two more episodes in the last month. Still no clear etiology.  Hx of extensive w/u. No new w/u or meds for this today.  NAUSEA Chronic, unclear etiology. Gastric emptying study this month was normal. Continue anti-emetics. GI referral likely soon, esp if iron levels continue to stay  down despite bid iron.  Anemia, iron deficiency No sign of blood loss has been detected.  Her iron levels didn't increase any on 1 iron tab a day.  She has been on iron bid for a couple of weeks and we plan on rechecking CBC, retic, and iron levels/ferritin at next f/u in 1 mo. If not showing response to bid iron, will refer to GI for  further eval for possible malabsorption syndrome (of note, celiac blood testing has been negative).  Chronic headaches Imitrex was minimally helpful. We'll make no changes in this right now.     FOLLOW UP: Return in about 1 month (around 10/04/2011) for f/u chronic pain, iron deficiency anemia.

## 2011-09-06 NOTE — Assessment & Plan Note (Signed)
Imitrex was minimally helpful. We'll make no changes in this right now.

## 2011-09-06 NOTE — Assessment & Plan Note (Signed)
No sign of blood loss has been detected.  Her iron levels didn't increase any on 1 iron tab a day.  She has been on iron bid for a couple of weeks and we plan on rechecking CBC, retic, and iron levels/ferritin at next f/u in 1 mo. If not showing response to bid iron, will refer to GI for further eval for possible malabsorption syndrome (of note, celiac blood testing has been negative).

## 2011-09-06 NOTE — Assessment & Plan Note (Signed)
Two more episodes in the last month. Still no clear etiology.  Hx of extensive w/u. No new w/u or meds for this today.

## 2011-09-06 NOTE — Assessment & Plan Note (Signed)
Stable.  Continue current measures: ice/heat, muscle relaxers, pain meds.  RFs given today.

## 2011-09-06 NOTE — Patient Instructions (Signed)
De Quervain's Disease Suzette Battiest disease is a condition often seen in racquet sports where there is a soreness (inflammation) in the cord like structures (tendons) which attach muscle to bone on the thumb side of the wrist. There may be a tightening of the tissuesaround the tendons. This condition is often helped by giving up or modifying the activity which caused it. When conservative treatment does not help, surgery may be required. Conservative treatment could include changes in the activity which brought about the problem or made it worse. Anti-inflammatory medications and injections may be used to help decrease the inflammation and help with pain control. Your caregiver will help you determine which is best for you. DIAGNOSIS  Often the diagnosis (learning what is wrong) can be made by examination. Sometimes x-rays are required. HOME CARE INSTRUCTIONS   Apply ice to the sore area for 15 to 20 minutes, 3 to 4 times per day while awake. Put the ice in a plastic bag and place a towel between the bag of ice and your skin. This is especially helpful if it can be done after all activities involving the sore wrist.   Temporary splinting may help.   Only take over-the-counter or prescription medicines for pain, discomfort or fever as directed by your caregiver.  SEEK MEDICAL CARE IF:   Pain relief is not obtained with medications, or if you have increasing pain and seem to be getting worse rather than better.  MAKE SURE YOU:   Understand these instructions.   Will watch your condition.   Will get help right away if you are not doing well or get worse.  Document Released: 04/17/2001 Document Revised: 04/04/2011 Document Reviewed: 07/23/2005 [facility] Patient Information 2012 [place], [place].

## 2011-10-02 ENCOUNTER — Ambulatory Visit: Payer: 59 | Admitting: Family Medicine

## 2011-10-03 ENCOUNTER — Encounter: Payer: Self-pay | Admitting: Family Medicine

## 2011-10-03 ENCOUNTER — Ambulatory Visit (INDEPENDENT_AMBULATORY_CARE_PROVIDER_SITE_OTHER): Payer: 59 | Admitting: Family Medicine

## 2011-10-03 VITALS — BP 129/86 | HR 94 | Temp 98.6°F | Ht 64.5 in | Wt 166.0 lb

## 2011-10-03 DIAGNOSIS — IMO0001 Reserved for inherently not codable concepts without codable children: Secondary | ICD-10-CM

## 2011-10-03 DIAGNOSIS — D509 Iron deficiency anemia, unspecified: Secondary | ICD-10-CM

## 2011-10-03 DIAGNOSIS — E042 Nontoxic multinodular goiter: Secondary | ICD-10-CM

## 2011-10-03 DIAGNOSIS — R110 Nausea: Secondary | ICD-10-CM

## 2011-10-03 DIAGNOSIS — R51 Headache: Secondary | ICD-10-CM

## 2011-10-03 DIAGNOSIS — K5909 Other constipation: Secondary | ICD-10-CM

## 2011-10-03 DIAGNOSIS — K5900 Constipation, unspecified: Secondary | ICD-10-CM

## 2011-10-03 DIAGNOSIS — M797 Fibromyalgia: Secondary | ICD-10-CM

## 2011-10-03 LAB — CBC WITH DIFFERENTIAL/PLATELET
Basophils Relative: 0.6 % (ref 0.0–3.0)
Eosinophils Relative: 1.9 % (ref 0.0–5.0)
HCT: 38 % (ref 36.0–46.0)
Hemoglobin: 12.6 g/dL (ref 12.0–15.0)
Lymphocytes Relative: 17.5 % (ref 12.0–46.0)
Lymphs Abs: 1.1 10*3/uL (ref 0.7–4.0)
Monocytes Relative: 7 % (ref 3.0–12.0)
Neutro Abs: 4.6 10*3/uL (ref 1.4–7.7)
RBC: 4.35 Mil/uL (ref 3.87–5.11)
RDW: 21 % — ABNORMAL HIGH (ref 11.5–14.6)

## 2011-10-03 LAB — FOLATE: Folate: 13.1 ng/mL (ref >5.9)

## 2011-10-03 LAB — FERRITIN: Ferritin: 18.1 ng/mL (ref 10.0–291.0)

## 2011-10-03 LAB — IBC PANEL
Saturation Ratios: 31 % (ref 20.0–50.0)
Transferrin: 276.3 mg/dL (ref 212.0–360.0)

## 2011-10-03 LAB — RETICULOCYTES
RBC.: 4.53 MIL/uL (ref 3.87–5.11)
Retic Ct Pct: 0.8 % (ref 0.4–2.3)

## 2011-10-03 MED ORDER — OXYCODONE HCL 10 MG PO TABS: ORAL_TABLET | ORAL | Status: DC

## 2011-10-03 MED ORDER — HYOSCYAMINE SULFATE 0.125 MG SL SUBL: SUBLINGUAL_TABLET | SUBLINGUAL | Status: DC

## 2011-10-03 MED ORDER — OXYCODONE HCL 60 MG PO TB12: 1.0000 | ORAL_TABLET | Freq: Two times a day (BID) | ORAL | Status: DC

## 2011-10-03 MED ORDER — CARISOPRODOL 250 MG PO TABS: ORAL_TABLET | ORAL | Status: DC

## 2011-10-03 MED ORDER — ZOLPIDEM TARTRATE 10 MG PO TABS: 10.0000 mg | ORAL_TABLET | Freq: Every evening | ORAL | Status: DC | PRN

## 2011-10-03 NOTE — Assessment & Plan Note (Signed)
Tension mostly but some migrainous as well (2-3 per month) with mild/mod response to imitrex for abortive med. She wants to stick with this med for now.

## 2011-10-03 NOTE — Assessment & Plan Note (Signed)
Problem stable.  Continue current medications and diet appropriate for this condition.  We have reviewed our general long term plan for this problem and also reviewed symptoms and signs that should prompt the patient to call or return to the office.  

## 2011-10-03 NOTE — Progress Notes (Signed)
OFFICE VISIT  10/03/2011   CC:  Chief Complaint  Patient presents with  . Follow-up    fibromyalgia, anemia     HPI:    Patient is a 46 y.o. Caucasian female who presents for f/u fibromyalgia/chronic pain med monitoring, also iron def anemia w/out any identified area of bleeding--suspicion of poor iron absorption. She's been on bid ferrous sulfate for about 6 wks.  Pain unchanged for the most part: 8/10 intensity brought down to 5/10 with current pain meds, frequent heat alt/w ice.  Does light housework with frequent rests.  Back, hips (extending down thighs lately) and knees ache the worst.  Currently doing no PT. Has hx of migraine HA's: had 2 in the last month, one lasted a couple of days, mild response to imitrex.  Still with frequent/chronic nausea w/out vomiting.  Constipation fairly well controlled lately with use of miralax in varying doses according to need.  Amitiza did not work out for her.    Some stomach cramping more in the last month, had to use some old levsin I had rx'd for her long ago.  She does have hx of IBS.    Past Medical History  Diagnosis Date  . DDD (degenerative disc disease), lumbar   . Atypical chest pain     Question of coronary vasospasm; cath 10/20/10 NORMAL  . Anxiety   . Fibromyalgia     Dr. Hope Pigeon [phone]  . Interstitial cystitis   . Obesity   . Insomnia   . Transaminasemia 03/2010    (ALT 49), normal on f/u testing off of daily tylenol uuse.  Marland Kitchen GERD (gastroesophageal reflux disease) 06/01/10    Dyspepsia/gastritis, EGD 05/2010: gastritis (NSAIDS)  . Rectal bleeding 06/01/10    Anorectal bleeding/BRBPR--colonoscopy: internal hemorrhoids  . Recurrent UTI     renal u/s normal 07/2007  . Weight loss     CT abd/pelf 08/22/10 remakable only for bile duct 9mm (unchanged from 03/2004 PRIOR to ERCP w/spincterotomy, bile duct balloon dredging and lap chole)  . Recurrent fever     W/u unrevealing, including ID consult.    . Asthma   .  Osteopenia     bone densitometry 2006 per pt  . 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 Christella Hartigan  . Multinodular goiter 11/2010    TSH normal  . Chest pain   . Rectal prolapse 02/2011    repaired lap [id number]  . Vitamin d deficiency 08/20/2011  . History of migraine headaches     Dx'd around 2009    Past Surgical History  Procedure Date  . Bunionectomy     right foot  . Abdominal hysterectomy   . Bilateral salpingoophorectomy 2003    endometriosis  . Cholecystectomy 2005    lap--(cholelithiasis w/choledocholithiasis on u/s 2005--PreOperative ERCP showed NO stone or other obstruction in the biliary tract, but CBD messured 9mm near head of pancreas.  S sphincterotomy and bile duct balloon dredging was done at that time.  MRCP 10/2010 NORMAL.  Marland Kitchen Orif metatarsal fracture 2006    Right 2nd and 3rd metatarsals (Dr. Romeo Apple)  . Biopsy thyroid 11/2010    Fine needle: NONmalignant goiter  . Rectal prolapse repair, rectopexy 05July2012    with sigmoid colectomy    Outpatient Prescriptions Prior to Visit  Medication Sig Dispense Refill  . albuterol (PROAIR HFA) 108 (90 BASE) MCG/ACT inhaler Inhale 2 puffs into the lungs every 4 (four) hours as needed.        Marland Kitchen  amitriptyline (ELAVIL) 150 MG tablet Take 1 tablet (150 mg total) by mouth at bedtime.  90 tablet  3  . amLODipine (NORVASC) 5 MG tablet Take 1 tablet (5 mg total) by mouth daily.  90 tablet  3  . Ascorbic Acid (VITAMIN C) 500 MG tablet Take 500 mg by mouth daily.        . Calcium Carbonate (CALCIUM 500 PO) Take 1 tablet by mouth daily.        . clonazePAM (KLONOPIN) 1 MG tablet Take 1 tablet (1 mg total) by mouth as directed. Take 1 tab po q8h  270 tablet  0  . esomeprazole (NEXIUM) 40 MG capsule 1 tab po bid  180 capsule  3  . Fish Oil OIL Take 2 capsules by mouth daily.        . Fluticasone-Salmeterol (ADVAIR DISKUS) 250-50 MCG/DOSE AEPB Inhale 1 puff into the lungs every 12 (twelve) hours.        .  ondansetron (ZOFRAN-ODT) 8 MG disintegrating tablet Take 1 tablet (8 mg total) by mouth every 8 (eight) hours as needed. For nausea  20 tablet  3  . polyethylene glycol powder (GLYCOLAX/MIRALAX) powder 1 capful one to three times per day as needed for constipation  1530 g  3  . promethazine (PHENERGAN) 25 MG tablet Take 1-2 tablets every 6 hours as needed for nausea  90 tablet  3  . SUMAtriptan (IMITREX) 50 MG tablet 1-2 tabs po q2h prn migraine HA, max in 24h is 300 mg  9 tablet  3  . valACYclovir (VALTREX) 500 MG tablet 1 tab po bid x 3d prn outbreak  6 tablet  3  . Vitamin D, Ergocalciferol, (DRISDOL) 50000 UNITS CAPS Take 1 capsule (50,000 Units total) by mouth every 7 (seven) days.  12 capsule  1  . carisoprodol (SOMA) 250 MG tablet 1 tab po tid prn  270 tablet  0  . Oxycodone HCl (OXYCONTIN) 60 MG TB12 Take 1 tablet (60 mg total) by mouth 2 (two) times daily. Pain contract in chart  60 each  0  . Oxycodone HCl 10 MG TABS Take 1/2-1 tablets by mouth every 4 hours as needed for breakthrough pain  150 tablet  0  . zolpidem (AMBIEN) 10 MG tablet Take 1 tablet (10 mg total) by mouth at bedtime as needed.  90 tablet  1    No Known Allergies  ROS As per HPI  PE: Blood pressure 129/86, pulse 94, temperature 98.6 F (37 C), temperature source Temporal, height 5' 4.5" (1.638 m), weight 166 lb (75.297 kg). Gen: Alert, well appearing.  Patient is oriented to person, place, time, and situation. Pleasant affect.  No further exam today.  LABS:  None today  IMPRESSION AND PLAN:  FIBROMYALGIA, SEVERE Problem stable.  Continue current medications and diet appropriate for this condition.  We have reviewed our general long term plan for this problem and also reviewed symptoms and signs that should prompt the patient to call or return to the office.   Anemia, iron deficiency Hemoccults NEG x 3 in December 2012. Recheck iron labs today to see if she responds adequately to oral iron. If not, we'll  proceed with GI referral for suspicion of malabsorption (of note celiac blood testing in recent past was neg).  Constipation, chronic Problem stable.  Continue current medications and diet appropriate for this condition.  We have reviewed our general long term plan for this problem and also reviewed symptoms and signs that should  prompt the patient to call or return to the office.   Chronic headaches Tension mostly but some migrainous as well (2-3 per month) with mild/mod response to imitrex for abortive med. She wants to stick with this med for now.     FOLLOW UP: Return in about 1 month (around 10/31/2011) for f/u fibromyalgia, anemia.

## 2011-10-03 NOTE — Assessment & Plan Note (Signed)
Hemoccults NEG x 3 in December 2012. Recheck iron labs today to see if she responds adequately to oral iron. If not, we'll proceed with GI referral for suspicion of malabsorption (of note celiac blood testing in recent past was neg).

## 2011-10-17 ENCOUNTER — Encounter: Payer: Self-pay | Admitting: *Deleted

## 2011-10-29 ENCOUNTER — Ambulatory Visit: Payer: 59 | Admitting: Family Medicine

## 2011-10-29 ENCOUNTER — Encounter: Payer: Self-pay | Admitting: Family Medicine

## 2011-10-29 ENCOUNTER — Other Ambulatory Visit: Payer: Self-pay | Admitting: Emergency Medicine

## 2011-10-29 ENCOUNTER — Ambulatory Visit (INDEPENDENT_AMBULATORY_CARE_PROVIDER_SITE_OTHER): Payer: 59 | Admitting: Family Medicine

## 2011-10-29 VITALS — BP 126/86 | HR 96 | Temp 98.8°F

## 2011-10-29 DIAGNOSIS — G43909 Migraine, unspecified, not intractable, without status migrainosus: Secondary | ICD-10-CM

## 2011-10-29 DIAGNOSIS — D509 Iron deficiency anemia, unspecified: Secondary | ICD-10-CM

## 2011-10-29 DIAGNOSIS — IMO0001 Reserved for inherently not codable concepts without codable children: Secondary | ICD-10-CM

## 2011-10-29 DIAGNOSIS — J029 Acute pharyngitis, unspecified: Secondary | ICD-10-CM

## 2011-10-29 DIAGNOSIS — M797 Fibromyalgia: Secondary | ICD-10-CM

## 2011-10-29 DIAGNOSIS — R609 Edema, unspecified: Secondary | ICD-10-CM

## 2011-10-29 DIAGNOSIS — E042 Nontoxic multinodular goiter: Secondary | ICD-10-CM

## 2011-10-29 DIAGNOSIS — R110 Nausea: Secondary | ICD-10-CM

## 2011-10-29 MED ORDER — FUROSEMIDE 20 MG PO TABS: 20.0000 mg | ORAL_TABLET | Freq: Every day | ORAL | Status: DC

## 2011-10-29 MED ORDER — OXYCODONE HCL 60 MG PO TB12: 1.0000 | ORAL_TABLET | Freq: Two times a day (BID) | ORAL | Status: DC

## 2011-10-29 MED ORDER — FUROSEMIDE 20 MG PO TABS: 20.0000 mg | ORAL_TABLET | Freq: Every day | ORAL | Status: DC | PRN

## 2011-10-29 MED ORDER — CEPHALEXIN 500 MG PO CAPS: 500.0000 mg | ORAL_CAPSULE | Freq: Four times a day (QID) | ORAL | Status: DC

## 2011-10-29 MED ORDER — OXYCODONE HCL 5 MG PO TABS: ORAL_TABLET | ORAL | Status: DC

## 2011-10-29 NOTE — Patient Instructions (Signed)
Edema Edema is an abnormal build-up of fluids in tissues. Because this is partly dependent on gravity (water flows to the lowest place), it is more common in the leg sand thighs (lower extremities). It is also common in the looser tissues, like around the eyes. Painless swelling of the feet and ankles is common and increases as a person ages. It may affect both legs and may include the calves or even thighs. When squeezed, the fluid may move out of the affected area and may leave a dent for a few moments. CAUSES   Prolonged standing or sitting in one place for extended periods of time. Movement helps pump tissue fluid into the veins, and absence of movement prevents this, resulting in edema.   Varicose veins. The valves in the veins do not work as well as they should. This causes fluid to leak into the tissues.   Fluid and salt overload.   Injury, burn, or surgery to the leg, ankle, or foot, may damage veins and allow fluid to leak out.   Sunburn damages vessels. Leaky vessels allow fluid to go out into the sunburned tissues.   Allergies (from insect bites or stings, medications or chemicals) cause swelling by allowing vessels to become leaky.   Protein in the blood helps keep fluid in your vessels. Low protein, as in malnutrition, allows fluid to leak out.   Hormonal changes, including pregnancy and menstruation, cause fluid retention. This fluid may leak out of vessels and cause edema.   Medications that cause fluid retention. Examples are sex hormones, blood pressure medications, steroid treatment, or anti-depressants.   Some illnesses cause edema, especially heart failure, kidney disease, or liver disease.   Surgery that cuts veins or lymph nodes, such as surgery done for the heart or for breast cancer, may result in edema.  DIAGNOSIS  Your caregiver is usually easily able to determine what is causing your swelling (edema) by simply asking what is wrong (getting a history) and examining  you (doing a physical). Sometimes x-rays, EKG (electrocardiogram or heart tracing), and blood work may be done to evaluate for underlying medical illness. TREATMENT  General treatment includes:  Leg elevation (or elevation of the affected body part).   Restriction of fluid intake.   Prevention of fluid overload.   Compression of the affected body part. Compression with elastic bandages or support stockings squeezes the tissues, preventing fluid from entering and forcing it back into the blood vessels.   Diuretics (also called water pills or fluid pills) pull fluid out of your body in the form of increased urination. These are effective in reducing the swelling, but can have side effects and must be used only under your caregiver's supervision. Diuretics are appropriate only for some types of edema.  The specific treatment can be directed at any underlying causes discovered. Heart, liver, or kidney disease should be treated appropriately. HOME CARE INSTRUCTIONS   Elevate the legs (or affected body part) above the level of the heart, while lying down.   Avoid sitting or standing still for prolonged periods of time.   Avoid putting anything directly under the knees when lying down, and do not wear constricting clothing or garters on the upper legs.   Exercising the legs causes the fluid to work back into the veins and lymphatic channels. This may help the swelling go down.   The pressure applied by elastic bandages or support stockings can help reduce ankle swelling.   A low-salt diet may help reduce fluid  retention and decrease the ankle swelling.   Take any medications exactly as prescribed.  SEEK MEDICAL CARE IF:  Your edema is not responding to recommended treatments. SEEK IMMEDIATE MEDICAL CARE IF:   You develop shortness of breath or chest pain.   You cannot breathe when you lay down; or if, while lying down, you have to get up and go to the window to get your breath.   You  are having increasing swelling without relief from treatment.   You develop a fever over 102 F (38.9 C).   You develop pain or redness in the areas that are swollen.   Tell your caregiver right away if you have gained 3 lb/1.4 kg in 1 day or 5 lb/2.3 kg in a week.  MAKE SURE YOU:   Understand these instructions.   Will watch your condition.   Will get help right away if you are not doing well or get worse.  Document Released: 07/23/2005 Document Revised: 07/12/2011 Document Reviewed: 03/10/2008 [facility] Patient Information 2012 [place], [place].   Put feet above heart for 15 minutes at least three times daily, try Jobst stockings, knee highs if edema persists on in am off in pm, lightweight is good. 10-20 mmhg on website or at [facility] medical [facility]

## 2011-10-29 NOTE — Telephone Encounter (Signed)
Per note created by Baldemar Lenis at 9:38 am: pt said it takes 50 mins for her to get to the office and could you call her when you start working on the prescriptions so she can pick them up  Pt appt was rescheduled today due to Dr. Milinda Cave being out of the office.  Pt appt has been rescheduled for tomorrow if Dr. Milinda Cave back in office.  Pt will run out of pain meds tonight and would like refill today.  Pt also complains of  "cellulitis" in feet.  Pt was offered appt today at 2 pm, but she is unable to come at that time.  If pt considers bad enough she will go to [facility].  Pt called while I was updating this note.  Appt made with Diane for 2pm.  Refills can be discussed at that time.

## 2011-10-30 ENCOUNTER — Ambulatory Visit: Payer: 59 | Admitting: Family Medicine

## 2011-10-31 ENCOUNTER — Encounter: Payer: Self-pay | Admitting: Family Medicine

## 2011-10-31 DIAGNOSIS — J029 Acute pharyngitis, unspecified: Secondary | ICD-10-CM | POA: Insufficient documentation

## 2011-10-31 DIAGNOSIS — R609 Edema, unspecified: Secondary | ICD-10-CM

## 2011-10-31 HISTORY — DX: Edema, unspecified: R60.9

## 2011-10-31 NOTE — Assessment & Plan Note (Addendum)
Trace b/l feet, encouraged her to avoid sodium raise feet above heart for 15 minutes twice daily. Jobst stockings are encouraged. She has been debriding her feet but there are no signs of  cellulitis

## 2011-10-31 NOTE — Progress Notes (Signed)
Patient ID: Alexandra Reid, female   DOB: [birth year], 46 y.o.   MRN: [id number] Alexandra Reid [id number] [birth year] 10/31/2011      Progress Note-Follow Up  Subjective  Chief Complaint  Chief Complaint  Patient presents with  . Foot Pain    cellulitis after using callus stone  . Medication Refill    oxycodone    HPI  Patient is a 46 yo caucasian female in today concerned about edema and discomfort in her feet. She is concerned that she has developed a mild cellulitis from debriding the skin on her feet with a metal tool. No discharge or severe pain. She notes her feet hurt when steps on them after sitting for a while. She has also been struggling with fevers to a tmax of 104 with HA, congestion, nausea. Sore throat. Chest tightness is also noted.  Past Medical History  Diagnosis Date  . DDD (degenerative disc disease), lumbar   . Atypical chest pain     Question of coronary vasospasm; cath 10/20/10 NORMAL  . Anxiety   . Fibromyalgia     Dr. Hope Pigeon [phone]  . Interstitial cystitis   . Obesity   . Insomnia   . Transaminasemia 03/2010    (ALT 49), normal on f/u testing off of daily tylenol uuse.  Marland Kitchen GERD (gastroesophageal reflux disease) 06/01/10    Dyspepsia/gastritis, EGD 05/2010: gastritis (NSAIDS)  . Rectal bleeding 06/01/10    Anorectal bleeding/BRBPR--colonoscopy: internal hemorrhoids  . Recurrent UTI     renal u/s normal 07/2007  . Weight loss     CT abd/pelf 08/22/10 remakable only for bile duct 9mm (unchanged from 03/2004 PRIOR to ERCP w/spincterotomy, bile duct balloon dredging and lap chole)  . Recurrent fever     W/u unrevealing, including ID consult.    . Asthma   . Osteopenia     bone densitometry 2006 per pt  . 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 Christella Hartigan  . Multinodular goiter 11/2010    TSH normal  . Chest pain   . Rectal prolapse 02/2011    repaired lap [id number]  . Vitamin d deficiency 08/20/2011  . History of  migraine headaches     Dx'd around 2009  . Edema 10/31/2011  . Pharyngitis 10/31/2011    Past Surgical History  Procedure Date  . Bunionectomy     right foot  . Abdominal hysterectomy   . Bilateral salpingoophorectomy 2003    endometriosis  . Cholecystectomy 2005    lap--(cholelithiasis w/choledocholithiasis on u/s 2005--PreOperative ERCP showed NO stone or other obstruction in the biliary tract, but CBD messured 9mm near head of pancreas.  S sphincterotomy and bile duct balloon dredging was done at that time.  MRCP 10/2010 NORMAL.  Marland Kitchen Orif metatarsal fracture 2006    Right 2nd and 3rd metatarsals (Dr. Romeo Apple)  . Biopsy thyroid 11/2010    Fine needle: NONmalignant goiter  . Rectal prolapse repair, rectopexy 05July2012    with sigmoid colectomy    Family History  Problem Relation Age of Onset  . Dementia Mother     Picks Disease  . Hypertension Mother   . Cancer Father     testicular/ melanoma  . Thyroid disease Sister   . Alcohol abuse Maternal Grandmother   . Other Maternal Grandmother     brain tumor (unsure if cancerous)  . Heart attack Maternal Grandfather   . Cancer Paternal Grandfather     colon    History   Social  History  . Marital Status: Married    Spouse Name: N/A    Number of Children: 2  . Years of Education: N/A   Occupational History  . Nurse [employer]   Social History Main Topics  . Smoking status: Never Smoker   . Smokeless tobacco: Never Used  . Alcohol Use: Yes     special occasion  . Drug Use: No  . Sexually Active: Yes -- Female partner(s)     Married, 2 children.  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  No exercise.   Other Topics Concern  . Not on file   Social History Narrative  . No narrative on file    Current Outpatient Prescriptions on File Prior to Visit  Medication Sig Dispense Refill  . albuterol (PROAIR HFA) 108 (90 BASE) MCG/ACT inhaler Inhale 2 puffs into the lungs every 4 (four) hours as needed.        Marland Kitchen amitriptyline  (ELAVIL) 150 MG tablet Take 1 tablet (150 mg total) by mouth at bedtime.  90 tablet  3  . Ascorbic Acid (VITAMIN C) 500 MG tablet Take 500 mg by mouth daily.        . Calcium Carbonate (CALCIUM 500 PO) Take 1 tablet by mouth daily.        . carisoprodol (SOMA) 250 MG tablet 1 tab po tid prn  270 tablet  0  . clonazePAM (KLONOPIN) 1 MG tablet Take 1 tablet (1 mg total) by mouth as directed. Take 1 tab po q8h  270 tablet  0  . esomeprazole (NEXIUM) 40 MG capsule 1 tab po bid  180 capsule  3  . Fish Oil OIL Take 2 capsules by mouth daily.        . Fluticasone-Salmeterol (ADVAIR DISKUS) 250-50 MCG/DOSE AEPB Inhale 1 puff into the lungs every 12 (twelve) hours.        . ondansetron (ZOFRAN-ODT) 8 MG disintegrating tablet Take 1 tablet (8 mg total) by mouth every 8 (eight) hours as needed. For nausea  20 tablet  3  . polyethylene glycol powder (GLYCOLAX/MIRALAX) powder 1 capful one to three times per day as needed for constipation  1530 g  3  . promethazine (PHENERGAN) 25 MG tablet Take 1-2 tablets every 6 hours as needed for nausea  90 tablet  3  . SUMAtriptan (IMITREX) 50 MG tablet 1-2 tabs po q2h prn migraine HA, max in 24h is 300 mg  9 tablet  3  . valACYclovir (VALTREX) 500 MG tablet 1 tab po bid x 3d prn outbreak  6 tablet  3  . Vitamin D, Ergocalciferol, (DRISDOL) 50000 UNITS CAPS Take 1 capsule (50,000 Units total) by mouth every 7 (seven) days.  12 capsule  1  . zolpidem (AMBIEN) 10 MG tablet Take 1 tablet (10 mg total) by mouth at bedtime as needed.  90 tablet  1  . amLODipine (NORVASC) 5 MG tablet Take 1 tablet (5 mg total) by mouth daily.  90 tablet  3  . hyoscyamine (LEVSIN SL) 0.125 MG SL tablet 1-2 tabs po q6h prn stomach cramps  30 tablet  3    No Known Allergies  Review of Systems  Review of Systems  Constitutional: Positive for fever, chills and malaise/fatigue.  HENT: Positive for congestion and sore throat.   Eyes: Negative for discharge.  Respiratory: Negative for shortness  of breath.   Cardiovascular: Positive for leg swelling. Negative for chest pain and palpitations.  Gastrointestinal: Positive for nausea. Negative for abdominal pain  and diarrhea.  Genitourinary: Negative for dysuria.  Musculoskeletal: Positive for myalgias. Negative for falls.  Skin: Positive for rash.  Neurological: Positive for headaches. Negative for loss of consciousness.  Endo/Heme/Allergies: Negative for polydipsia.  Psychiatric/Behavioral: Negative for depression and suicidal ideas. The patient is not nervous/anxious and does not have insomnia.     Objective  BP 126/86  Pulse 96  Temp(Src) 98.8 F (37.1 C) (Temporal)  Physical Exam  Physical Exam  Constitutional: She is oriented to person, place, and time and well-developed, well-nourished, and in no distress. No distress.  HENT:  Head: Normocephalic and atraumatic.       Oropharynx erythematous and edematous  Eyes: Conjunctivae are normal.  Neck: Neck supple. No thyromegaly present.  Cardiovascular: Normal rate, regular rhythm and normal heart sounds.   No murmur heard. Pulmonary/Chest: Effort normal and breath sounds normal. She has no wheezes.  Abdominal: She exhibits no distension and no mass.  Musculoskeletal: She exhibits edema.       Trace pedal edema b/l feet.   Lymphadenopathy:    She has cervical adenopathy.  Neurological: She is alert and oriented to person, place, and time.  Skin: Skin is warm and dry. No rash noted. She is not diaphoretic.  Psychiatric: Memory, affect and judgment normal.    Lab Results  Component Value Date   TSH 0.993 08/17/2011   Lab Results  Component Value Date   WBC 6.3 10/03/2011   HGB 12.6 10/03/2011   HCT 38.0 10/03/2011   MCV 87.4 10/03/2011   PLT 220.0 10/03/2011   Lab Results  Component Value Date   CREATININE 0.65 08/17/2011   BUN 8 08/17/2011   NA 129* 08/17/2011   K 4.2 08/17/2011   CL 94* 08/17/2011   CO2 27 08/17/2011   Lab Results  Component Value Date   ALT 17  08/17/2011   AST 33 08/17/2011   ALKPHOS 101 08/17/2011   BILITOT 0.3 08/17/2011   Lab Results  Component Value Date   CHOL  Value: 160        ATP III CLASSIFICATION:  <200     mg/dL   Desirable  161-096  mg/dL   Borderline High  >=045    mg/dL   High        11/12/8117   Lab Results  Component Value Date   HDL 55 10/20/2010   Lab Results  Component Value Date   LDLCALC  Value: 92        Total Cholesterol/HDL:CHD Risk Coronary Heart Disease Risk Table                     Men   Women  1/2 Average Risk   3.4   3.3  Average Risk       5.0   4.4  2 X Average Risk   9.6   7.1  3 X Average Risk  23.4   11.0        Use the calculated Patient Ratio above and the CHD Risk Table to determine the patient's CHD Risk.        ATP III CLASSIFICATION (LDL):  <100     mg/dL   Optimal  147-829  mg/dL   Near or Above                    Optimal  130-159  mg/dL   Borderline  562-130  mg/dL   High  >865     mg/dL   Very  High 10/20/2010   Lab Results  Component Value Date   TRIG 63 10/20/2010   Lab Results  Component Value Date   CHOLHDL 2.9 10/20/2010     Assessment & Plan  Edema Trace b/l feet, encouraged her to avoid sodium raise feet above heart for 15 minutes twice daily. Jobst stockings are encouraged. She has been debriding her feet but there are no signs of  cellulitis  Pharyngitis Patient reporting temp to max of 104 with some nausea and HA. Given Keflex 500mg  qid, start a probiotic and push clear fluids.   Is given a small amount of Lasix to use prn as well

## 2011-10-31 NOTE — Assessment & Plan Note (Signed)
Patient reporting temp to max of 104 with some nausea and HA. Given Keflex 500mg  qid, start a probiotic and push clear fluids.

## 2011-11-07 ENCOUNTER — Encounter: Payer: Self-pay | Admitting: Family Medicine

## 2011-11-07 ENCOUNTER — Ambulatory Visit (INDEPENDENT_AMBULATORY_CARE_PROVIDER_SITE_OTHER): Payer: 59 | Admitting: Family Medicine

## 2011-11-07 VITALS — BP 124/81 | HR 106 | Temp 98.4°F | Ht 64.5 in | Wt 163.0 lb

## 2011-11-07 DIAGNOSIS — E042 Nontoxic multinodular goiter: Secondary | ICD-10-CM

## 2011-11-07 DIAGNOSIS — M79672 Pain in left foot: Secondary | ICD-10-CM

## 2011-11-07 DIAGNOSIS — IMO0001 Reserved for inherently not codable concepts without codable children: Secondary | ICD-10-CM

## 2011-11-07 DIAGNOSIS — D509 Iron deficiency anemia, unspecified: Secondary | ICD-10-CM

## 2011-11-07 DIAGNOSIS — M79609 Pain in unspecified limb: Secondary | ICD-10-CM

## 2011-11-07 DIAGNOSIS — G2581 Restless legs syndrome: Secondary | ICD-10-CM

## 2011-11-07 DIAGNOSIS — A689 Relapsing fever, unspecified: Secondary | ICD-10-CM

## 2011-11-07 DIAGNOSIS — M654 Radial styloid tenosynovitis [de Quervain]: Secondary | ICD-10-CM

## 2011-11-07 DIAGNOSIS — R110 Nausea: Secondary | ICD-10-CM

## 2011-11-07 DIAGNOSIS — M797 Fibromyalgia: Secondary | ICD-10-CM

## 2011-11-07 MED ORDER — PRAMIPEXOLE DIHYDROCHLORIDE 0.125 MG PO TABS: ORAL_TABLET | ORAL | Status: DC

## 2011-11-07 MED ORDER — PROMETHAZINE HCL 25 MG PO TABS: ORAL_TABLET | ORAL | Status: DC

## 2011-11-07 MED ORDER — AMLODIPINE BESYLATE 5 MG PO TABS: 5.0000 mg | ORAL_TABLET | Freq: Every day | ORAL | Status: DC

## 2011-11-07 MED ORDER — ONDANSETRON 8 MG PO TBDP: 8.0000 mg | ORAL_TABLET | Freq: Three times a day (TID) | ORAL | Status: DC | PRN

## 2011-11-07 MED ORDER — HYOSCYAMINE SULFATE 0.125 MG SL SUBL: 0.1250 mg | SUBLINGUAL_TABLET | Freq: Four times a day (QID) | SUBLINGUAL | Status: DC | PRN

## 2011-11-07 MED ORDER — AMITRIPTYLINE HCL 100 MG PO TABS: 100.0000 mg | ORAL_TABLET | Freq: Every day | ORAL | Status: DC

## 2011-11-07 NOTE — Progress Notes (Signed)
OFFICE VISIT  11/11/2011   CC:  Chief Complaint  Patient presents with  . Follow-up     HPI:    Patient is a 46 y.o. Caucasian female who presents for routine f/u for severe fibromyalgia.  She came last week (10/29/11) for her routinely scheduled appt and I was out sick; got her pain meds RF'd and discussed some feet pain with Dr. Abner Greenspan.  She has been using 1/2 of lasix tab occasionally to help with swelling: she reports onset of pain, swelling, and mottled color to skin of bottom of feet ever since using an abrasive instrument to scraped off thickened skin.  This was about 2 wks ago and persists.  Burning sensation described, worse with wt bearing but present even when off of feet.  Lower legs and ankles seem unaffected.    Fibromyalgia pain unchanged: reasonably well controlled with current pain med regimen, still able to do only light housework for 15-20 min straight, uses ice/heat a lot; main areas --diffuse musculoskeletal pain.  No swelling or redness or warmth of any joints.  No rashes.  Has had one febrile episode since her last visit with me: T 104, worsened nausea, worsened body aches and headaches, worsened fatigue.  Of note, she had a prolonged URI when Dr. Abner Greenspan saw her last week and was given a rx for keflex and she says she feels like this did not help anything, although pt admits she thought it was given to help her feet.  IBS sx's worse lately, having to use more levsin than usual but finds it helpful and asks for RF. Nausea also "bad" this month, no vomiting.    Has had one migraine "episode", which is her way of describing on/off migraines over the course of days (4 in this instance), uses maxalt intermittently during this time and finds it pretty helpful.  Says her left thumb pain is persisting, worsening, constant--we have dx'd her with Tommi Rumps Quervain's tenosynovitis in the past and she has been wearing a splint much of the time and using ice frequently--still no help, pt asks  what is next step.  Finally, she says her RLS is consistently worse lately and asks for trial of mirapex.  Past Medical History  Diagnosis Date  . DDD (degenerative disc disease), lumbar   . Atypical chest pain     Question of coronary vasospasm; cath 10/20/10 NORMAL  . Anxiety   . Fibromyalgia     Dr. Hope Pigeon [phone]  . Interstitial cystitis   . Obesity   . Insomnia   . Transaminasemia 03/2010    (ALT 49), normal on f/u testing off of daily tylenol uuse.  Marland Kitchen GERD (gastroesophageal reflux disease) 06/01/10    Dyspepsia/gastritis, EGD 05/2010: gastritis (NSAIDS)  . Rectal bleeding 06/01/10    Anorectal bleeding/BRBPR--colonoscopy: internal hemorrhoids  . Recurrent UTI     renal u/s normal 07/2007  . Weight loss     CT abd/pelf 08/22/10 remakable only for bile duct 9mm (unchanged from 03/2004 PRIOR to ERCP w/spincterotomy, bile duct balloon dredging and lap chole)  . Recurrent fever     W/u unrevealing, including ID consult.    . Asthma   . Osteopenia     bone densitometry 2006 per pt  . 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 Christella Hartigan  . Multinodular goiter 11/2010    TSH normal  . Chest pain   . Rectal prolapse 02/2011    repaired lap [id number]  . Vitamin d deficiency  08/20/2011  . History of migraine headaches     Dx'd around 2009  . Edema 10/31/2011  . Pharyngitis 10/31/2011    Past Surgical History  Procedure Date  . Bunionectomy     right foot  . Abdominal hysterectomy   . Bilateral salpingoophorectomy 2003    endometriosis  . Cholecystectomy 2005    lap--(cholelithiasis w/choledocholithiasis on u/s 2005--PreOperative ERCP showed NO stone or other obstruction in the biliary tract, but CBD messured 9mm near head of pancreas.  S sphincterotomy and bile duct balloon dredging was done at that time.  MRCP 10/2010 NORMAL.  Marland Kitchen Orif metatarsal fracture 2006    Right 2nd and 3rd metatarsals (Dr. Romeo Apple)  . Biopsy thyroid 11/2010    Fine needle:  NONmalignant goiter  . Rectal prolapse repair, rectopexy 05July2012    with sigmoid colectomy    Outpatient Prescriptions Prior to Visit  Medication Sig Dispense Refill  . albuterol (PROAIR HFA) 108 (90 BASE) MCG/ACT inhaler Inhale 2 puffs into the lungs every 4 (four) hours as needed.        . Ascorbic Acid (VITAMIN C) 500 MG tablet Take 500 mg by mouth daily.        . Calcium Carbonate (CALCIUM 500 PO) Take 1 tablet by mouth daily.        . carisoprodol (SOMA) 250 MG tablet 1 tab po tid prn  270 tablet  0  . clonazePAM (KLONOPIN) 1 MG tablet Take 1 tablet (1 mg total) by mouth as directed. Take 1 tab po q8h  270 tablet  0  . esomeprazole (NEXIUM) 40 MG capsule 1 tab po bid  180 capsule  3  . Fish Oil OIL Take 2 capsules by mouth daily.        . Fluticasone-Salmeterol (ADVAIR DISKUS) 250-50 MCG/DOSE AEPB Inhale 1 puff into the lungs every 12 (twelve) hours.        . furosemide (LASIX) 20 MG tablet Take 1 tablet (20 mg total) by mouth daily as needed (edema).  30 tablet  3  . MAGNESIUM PO Take 1 tablet by mouth daily.      Marland Kitchen oxyCODONE (ROXICODONE) 5 MG immediate release tablet 1-2 tabs po q 4 hours prn breakthrough pain  300 tablet  0  . Oxycodone HCl (OXYCONTIN) 60 MG TB12 Take 1 tablet (60 mg total) by mouth 2 (two) times daily. Pain contract in chart  60 each  0  . polyethylene glycol powder (GLYCOLAX/MIRALAX) powder 1 capful one to three times per day as needed for constipation  1530 g  3  . SUMAtriptan (IMITREX) 50 MG tablet 1-2 tabs po q2h prn migraine HA, max in 24h is 300 mg  9 tablet  3  . valACYclovir (VALTREX) 500 MG tablet 1 tab po bid x 3d prn outbreak  6 tablet  3  . Vitamin D, Ergocalciferol, (DRISDOL) 50000 UNITS CAPS Take 1 capsule (50,000 Units total) by mouth every 7 (seven) days.  12 capsule  1  . zolpidem (AMBIEN) 10 MG tablet Take 1 tablet (10 mg total) by mouth at bedtime as needed.  90 tablet  1  . amitriptyline (ELAVIL) 150 MG tablet Take 1 tablet (150 mg total) by  mouth at bedtime.  90 tablet  3  . amLODipine (NORVASC) 5 MG tablet Take 1 tablet (5 mg total) by mouth daily.  90 tablet  3  . hyoscyamine (LEVSIN SL) 0.125 MG SL tablet 1-2 tabs po q6h prn stomach cramps  30 tablet  3  .  ondansetron (ZOFRAN-ODT) 8 MG disintegrating tablet Take 1 tablet (8 mg total) by mouth every 8 (eight) hours as needed. For nausea  20 tablet  3  . promethazine (PHENERGAN) 25 MG tablet Take 1-2 tablets every 6 hours as needed for nausea  90 tablet  3  . cephALEXin (KEFLEX) 500 MG capsule Take 1 capsule (500 mg total) by mouth 4 (four) times daily.  40 capsule  0  **No longer on cephalexin. She takes iron 325mg  bid  No Known Allergies  ROS As per HPI  PE: Blood pressure 124/81, pulse 106, temperature 98.4 F (36.9 C), temperature source Temporal, height 5' 4.5" (1.638 m), weight 163 lb (73.936 kg). Gen: Alert, well appearing.  Patient is oriented to person, place, time, and situation. ENT: Ears: EACs clear, normal epithelium.  TMs with good light reflex and landmarks bilaterally.  Eyes: no injection, icteris, swelling, or exudate.  EOMI, PERRLA. Nose: no drainage or turbinate edema/swelling.  No injection or focal lesion.  Mouth: lips without lesion/swelling.  Oral mucosa pink and moist.  Dentition intact and without obvious caries or gingival swelling.  Oropharynx without erythema, exudate, or swelling.  Neck - No masses or thyromegaly or limitation in range of motion CV: RRR, no m/r/g.   LUNGS: CTA bilat, nonlabored resps, good aeration in all lung fields. EXT: no clubbing or cyanosis.  Hands without edema or color change. Plantar surface of both feet have pitting edema, mottled yellowish appearance when pressure is applied, and are very sensitive to touch.  No ankle edema or edema on dorsum of feet.  No erythema, no ankle warmth or ROM pain/stiffness.  DP and PT pulses 1+ bilat.   LABS:  none  IMPRESSION AND PLAN:  RESTLESS LEG SYNDROME Pt desires trial of  mirapex: start 0.125mg  qhs, titrate up 1 tab q7d up to max of 4 tabs qhs.  Therapeutic expectations and side effect profile of medication discussed today.  Patient's questions answered. She willl slowly ween off of amitriptyline : change to 100 mg qd dosing for the next 1 month and then likely step down to 50mg  qhs for a month. Currently we are working on building her iron stores back up, which hopefully will help her RLS as well.   FIBROMYALGIA, SEVERE Problem stable.  Continue current medications and diet appropriate for this condition.  We have reviewed our general long term plan for this problem and also reviewed symptoms and signs that should prompt the patient to call or return to the office. No narcotic rx rf needed today (this was done 10/29/11).  FEVER, RECURRENT I have no explanation for this.  Extensive w/u in the past has been unrevealing.  Anemia, iron deficiency Possibly diet related.  Hemoccults have been neg.  No vag bleeding (s/p hysterectomy). Celiac testing negative.  Labs showed mild response to bid ferrous sulfate 325mg  bid about 5 wks ago.  Recheck labs next f/u.  Continue bid iron.  De Quervain's tenosynovitis, left Has failed conservative mgmt. Will do steroid injection for this at separate appt at her convenience.  Pain in both feet Question of reflex sympathetic dystrophy.   Will see how her symptoms go over the next month or so and if no improvement then will see if neuro will see her for further e/m of this problem.     FOLLOW UP: Return in about 1 month (around 12/07/2011) for routine fibromyalgia f/u.  Marland Kitchen

## 2011-11-09 ENCOUNTER — Ambulatory Visit: Payer: 59 | Admitting: Family Medicine

## 2011-11-11 DIAGNOSIS — M654 Radial styloid tenosynovitis [de Quervain]: Secondary | ICD-10-CM | POA: Insufficient documentation

## 2011-11-11 DIAGNOSIS — M79671 Pain in right foot: Secondary | ICD-10-CM | POA: Insufficient documentation

## 2011-11-11 HISTORY — DX: Radial styloid tenosynovitis (de quervain): M65.4

## 2011-11-11 NOTE — Assessment & Plan Note (Signed)
I have no explanation for this.  Extensive w/u in the past has been unrevealing.

## 2011-11-11 NOTE — Assessment & Plan Note (Signed)
Problem stable.  Continue current medications and diet appropriate for this condition.  We have reviewed our general long term plan for this problem and also reviewed symptoms and signs that should prompt the patient to call or return to the office. No narcotic rx rf needed today (this was done 10/29/11).

## 2011-11-11 NOTE — Assessment & Plan Note (Signed)
Has failed conservative mgmt. Will do steroid injection for this at separate appt at her convenience.

## 2011-11-11 NOTE — Assessment & Plan Note (Signed)
Pt desires trial of mirapex: start 0.125mg  qhs, titrate up 1 tab q7d up to max of 4 tabs qhs.  Therapeutic expectations and side effect profile of medication discussed today.  Patient's questions answered. She willl slowly ween off of amitriptyline : change to 100 mg qd dosing for the next 1 month and then likely step down to 50mg  qhs for a month. Currently we are working on building her iron stores back up, which hopefully will help her RLS as well.

## 2011-11-11 NOTE — Assessment & Plan Note (Signed)
Possibly diet related.  Hemoccults have been neg.  No vag bleeding (s/p hysterectomy). Celiac testing negative.  Labs showed mild response to bid ferrous sulfate 325mg  bid about 5 wks ago.  Recheck labs next f/u.  Continue bid iron.

## 2011-11-11 NOTE — Assessment & Plan Note (Signed)
Question of reflex sympathetic dystrophy.   Will see how her symptoms go over the next month or so and if no improvement then will see if neuro will see her for further e/m of this problem.

## 2011-11-26 ENCOUNTER — Other Ambulatory Visit: Payer: Self-pay | Admitting: Family Medicine

## 2011-11-26 DIAGNOSIS — E042 Nontoxic multinodular goiter: Secondary | ICD-10-CM

## 2011-11-26 DIAGNOSIS — R110 Nausea: Secondary | ICD-10-CM

## 2011-11-26 DIAGNOSIS — M797 Fibromyalgia: Secondary | ICD-10-CM

## 2011-11-26 DIAGNOSIS — D509 Iron deficiency anemia, unspecified: Secondary | ICD-10-CM

## 2011-11-26 MED ORDER — OXYCODONE HCL 60 MG PO TB12: 1.0000 | ORAL_TABLET | Freq: Two times a day (BID) | ORAL | Status: DC

## 2011-11-26 MED ORDER — OXYCODONE HCL 5 MG PO TABS: ORAL_TABLET | ORAL | Status: DC

## 2011-11-26 NOTE — Telephone Encounter (Signed)
Please advise 

## 2011-11-26 NOTE — Telephone Encounter (Signed)
Shredded first RX's due to it saying Dr Milinda Cave will reprint

## 2011-11-26 NOTE — Telephone Encounter (Signed)
OK to refill Oxycodone and Oxycontin when due with same sig, same # as last month

## 2011-11-26 NOTE — Telephone Encounter (Signed)
RX's printed and pt can pick up wed afternoon (4-24). Left a message for pt to return my call.

## 2011-11-26 NOTE — Telephone Encounter (Signed)
pts RX's were refilled on 10-29-11. Oxycodone quantity 300 and Oxycontin quantity 60?

## 2011-11-27 ENCOUNTER — Other Ambulatory Visit: Payer: Self-pay

## 2011-11-27 MED ORDER — PRAMIPEXOLE DIHYDROCHLORIDE 0.125 MG PO TABS: ORAL_TABLET | ORAL | Status: DC

## 2011-11-27 NOTE — Telephone Encounter (Signed)
Patient informed. 

## 2011-11-27 NOTE — Telephone Encounter (Signed)
The Hycosamine has 3 refills and the Mirapex I will send a refill in.  Left a message for patient to return my call. The Oxycodone can be picked up tomorrow afternoon (4-24)

## 2011-11-29 ENCOUNTER — Other Ambulatory Visit: Payer: Self-pay | Admitting: Family Medicine

## 2011-11-30 ENCOUNTER — Ambulatory Visit: Payer: 59 | Admitting: Family Medicine

## 2011-11-30 NOTE — Telephone Encounter (Signed)
Pt was given RX to have on hand in 10/2010.  One refill sent to pharmacy.

## 2011-12-03 ENCOUNTER — Ambulatory Visit: Payer: 59 | Admitting: Family Medicine

## 2011-12-04 ENCOUNTER — Other Ambulatory Visit: Payer: Self-pay | Admitting: Family Medicine

## 2011-12-04 ENCOUNTER — Encounter: Payer: Self-pay | Admitting: Family Medicine

## 2011-12-04 ENCOUNTER — Ambulatory Visit (INDEPENDENT_AMBULATORY_CARE_PROVIDER_SITE_OTHER): Payer: 59 | Admitting: Family Medicine

## 2011-12-04 VITALS — BP 128/83 | HR 104 | Temp 98.3°F | Ht 64.5 in | Wt 162.0 lb

## 2011-12-04 DIAGNOSIS — R300 Dysuria: Secondary | ICD-10-CM

## 2011-12-04 DIAGNOSIS — IMO0001 Reserved for inherently not codable concepts without codable children: Secondary | ICD-10-CM

## 2011-12-04 DIAGNOSIS — M791 Myalgia, unspecified site: Secondary | ICD-10-CM

## 2011-12-04 DIAGNOSIS — M654 Radial styloid tenosynovitis [de Quervain]: Secondary | ICD-10-CM

## 2011-12-04 DIAGNOSIS — M797 Fibromyalgia: Secondary | ICD-10-CM

## 2011-12-04 DIAGNOSIS — D509 Iron deficiency anemia, unspecified: Secondary | ICD-10-CM

## 2011-12-04 DIAGNOSIS — N3090 Cystitis, unspecified without hematuria: Secondary | ICD-10-CM

## 2011-12-04 DIAGNOSIS — M79609 Pain in unspecified limb: Secondary | ICD-10-CM

## 2011-12-04 DIAGNOSIS — E042 Nontoxic multinodular goiter: Secondary | ICD-10-CM

## 2011-12-04 DIAGNOSIS — A689 Relapsing fever, unspecified: Secondary | ICD-10-CM

## 2011-12-04 DIAGNOSIS — R509 Fever, unspecified: Secondary | ICD-10-CM

## 2011-12-04 DIAGNOSIS — R5383 Other fatigue: Secondary | ICD-10-CM

## 2011-12-04 DIAGNOSIS — R5381 Other malaise: Secondary | ICD-10-CM

## 2011-12-04 DIAGNOSIS — G2581 Restless legs syndrome: Secondary | ICD-10-CM

## 2011-12-04 DIAGNOSIS — M79671 Pain in right foot: Secondary | ICD-10-CM

## 2011-12-04 LAB — CBC WITH DIFFERENTIAL/PLATELET
Basophils Absolute: 0 10*3/uL (ref 0.0–0.1)
Eosinophils Absolute: 0.1 10*3/uL (ref 0.0–0.7)
Hemoglobin: 13 g/dL (ref 12.0–15.0)
Lymphocytes Relative: 13.6 % (ref 12.0–46.0)
Lymphs Abs: 0.9 10*3/uL (ref 0.7–4.0)
MCHC: 33 g/dL (ref 30.0–36.0)
Neutro Abs: 5.3 10*3/uL (ref 1.4–7.7)
RDW: 14.1 % (ref 11.5–14.6)

## 2011-12-04 LAB — COMPREHENSIVE METABOLIC PANEL
AST: 26 U/L (ref 0–37)
Alkaline Phosphatase: 94 U/L (ref 39–117)
BUN: 5 mg/dL — ABNORMAL LOW (ref 6–23)
Creatinine, Ser: 0.7 mg/dL (ref 0.4–1.2)
Potassium: 4.3 mEq/L (ref 3.5–5.1)

## 2011-12-04 LAB — POCT URINALYSIS DIPSTICK
Bilirubin, UA: NEGATIVE
Glucose, UA: NEGATIVE
Nitrite, UA: POSITIVE
pH, UA: 7

## 2011-12-04 LAB — T3: T3, Total: 98.3 ng/dL (ref 80.0–204.0)

## 2011-12-04 LAB — T4, FREE: Free T4: 0.88 ng/dL (ref 0.60–1.60)

## 2011-12-04 LAB — FERRITIN: Ferritin: 22.1 ng/mL (ref 10.0–291.0)

## 2011-12-04 MED ORDER — SUMATRIPTAN SUCCINATE 50 MG PO TABS: ORAL_TABLET | ORAL | Status: DC

## 2011-12-04 MED ORDER — CLONAZEPAM 1 MG PO TABS: 1.0000 mg | ORAL_TABLET | ORAL | Status: DC

## 2011-12-04 MED ORDER — MELOXICAM 7.5 MG PO TABS: 7.5000 mg | ORAL_TABLET | Freq: Every day | ORAL | Status: DC

## 2011-12-04 MED ORDER — FLUCONAZOLE 150 MG PO TABS: 150.0000 mg | ORAL_TABLET | Freq: Once | ORAL | Status: AC

## 2011-12-04 NOTE — Progress Notes (Signed)
OFFICE VISIT  12/05/2011   CC:  Chief Complaint  Patient presents with  . med refill     HPI:    Patient is a 46 y.o. Caucasian female who presents with her sister today for routine monthly follow up for severe fibromyalgia syndrome and chronic narcotic pain med use for this, also has hx of recurrent febrile illness of unknown etiology--extensive w/u for malignancy, infectious dz, and autoimmune dz has been unrevealing. Pain med rx's were done 11/26/11 by Dr. Abner Greenspan when I was out.  Fibromyalgia pain has been unchanged on chronic med regimen. Still having lots of hands and feet mottling, pain, subtle swelling--thinks maybe she has raynaud's now and not RSD. She has been having more "low grade fevers" off and on, some mouth ulcers.  Describes recent n/v illness this past w/e, more migraines at that time.  Asks for 90 day supply of her migraine med.  She comes out directly and asks for repeat evaluation for Lupus.  Dr. Rayburn Ma injected her left De Quervaine's tenosynovitis 5 days ago and it is improving.  She has weened down to 75mg  amitriptyline and takes 2 mirapex tabs qhs and says RLS is much better.  Also says at end of visit that she is having more urinary burning, urgency, and frequency in the last few days and she has been taking more pyridium lately (last dose about 36h ago)--wonders about flare of her IS vs acute UTI--she gave a urine sample as she left the office today.  The urine did have a mild orange color.  Past Medical History  Diagnosis Date  . DDD (degenerative disc disease), lumbar   . Atypical chest pain     Question of coronary vasospasm; cath 10/20/10 NORMAL  . Anxiety   . Fibromyalgia     Dr. Hope Pigeon [phone]  . Interstitial cystitis   . Obesity   . Insomnia   . Transaminasemia 03/2010    (ALT 49), normal on f/u testing off of daily tylenol uuse.  Marland Kitchen GERD (gastroesophageal reflux disease) 06/01/10    Dyspepsia/gastritis, EGD 05/2010: gastritis (NSAIDS)  .  Rectal bleeding 06/01/10    Anorectal bleeding/BRBPR--colonoscopy: internal hemorrhoids  . Recurrent UTI     renal u/s normal 07/2007  . Weight loss     CT abd/pelf 08/22/10 remakable only for bile duct 9mm (unchanged from 03/2004 PRIOR to ERCP w/spincterotomy, bile duct balloon dredging and lap chole)  . Recurrent fever     W/u unrevealing, including ID consult.    . Asthma   . Osteopenia     bone densitometry 2006 per pt  . 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 Christella Hartigan  . Multinodular goiter 11/2010    TSH normal  . Chest pain   . Rectal prolapse 02/2011    repaired lap [id number]  . Vitamin d deficiency 08/20/2011  . History of migraine headaches     Dx'd around 2009  . Edema 10/31/2011  . Pharyngitis 10/31/2011    Past Surgical History  Procedure Date  . Bunionectomy     right foot  . Abdominal hysterectomy   . Bilateral salpingoophorectomy 2003    endometriosis  . Cholecystectomy 2005    lap--(cholelithiasis w/choledocholithiasis on u/s 2005--PreOperative ERCP showed NO stone or other obstruction in the biliary tract, but CBD messured 9mm near head of pancreas.  S sphincterotomy and bile duct balloon dredging was done at that time.  MRCP 10/2010 NORMAL.  Marland Kitchen Orif metatarsal fracture 2006  Right 2nd and 3rd metatarsals (Dr. Romeo Apple)  . Biopsy thyroid 11/2010    Fine needle: NONmalignant goiter  . Rectal prolapse repair, rectopexy 05July2012    with sigmoid colectomy    Outpatient Prescriptions Prior to Visit  Medication Sig Dispense Refill  . albuterol (PROAIR HFA) 108 (90 BASE) MCG/ACT inhaler Inhale 2 puffs into the lungs every 4 (four) hours as needed.        Marland Kitchen amitriptyline (ELAVIL) 100 MG tablet Take 1 tablet (100 mg total) by mouth at bedtime.  30 tablet  1  . amLODipine (NORVASC) 5 MG tablet Take 1 tablet (5 mg total) by mouth daily.  90 tablet  1  . Ascorbic Acid (VITAMIN C) 500 MG tablet Take 500 mg by mouth daily.        . Calcium  Carbonate (CALCIUM 500 PO) Take 1 tablet by mouth daily.        . carisoprodol (SOMA) 250 MG tablet 1 tab po tid prn  270 tablet  0  . esomeprazole (NEXIUM) 40 MG capsule 1 tab po bid  180 capsule  3  . Fish Oil OIL Take 2 capsules by mouth daily.        . Fluticasone-Salmeterol (ADVAIR DISKUS) 250-50 MCG/DOSE AEPB Inhale 1 puff into the lungs every 12 (twelve) hours.        . furosemide (LASIX) 20 MG tablet Take 1 tablet (20 mg total) by mouth daily as needed (edema).  30 tablet  3  . hyoscyamine (LEVSIN SL) 0.125 MG SL tablet Take 1-2 tablets (0.125-0.25 mg total) by mouth every 6 (six) hours as needed for cramping.  90 tablet  3  . MAGNESIUM PO Take 1 tablet by mouth daily.      . ondansetron (ZOFRAN-ODT) 8 MG disintegrating tablet Take 1 tablet (8 mg total) by mouth every 8 (eight) hours as needed. For nausea  20 tablet  3  . oxyCODONE (ROXICODONE) 5 MG immediate release tablet 1-2 tabs po q 4 hours prn breakthrough pain  300 tablet  0  . Oxycodone HCl (OXYCONTIN) 60 MG TB12 Take 1 tablet (60 mg total) by mouth 2 (two) times daily. Pain contract in chart  60 each  0  . phenazopyridine (PYRIDIUM) 200 MG tablet TAKE 1 TABLET BY MOUTH 3 TIMES DAILY AS NEEDED FOR PAIN.  20 tablet  0  . polyethylene glycol powder (GLYCOLAX/MIRALAX) powder 1 capful one to three times per day as needed for constipation  1530 g  3  . promethazine (PHENERGAN) 25 MG tablet Take 1-2 tablets every 6 hours as needed for nausea  360 tablet  1  . valACYclovir (VALTREX) 500 MG tablet 1 tab po bid x 3d prn outbreak  6 tablet  3  . Vitamin D, Ergocalciferol, (DRISDOL) 50000 UNITS CAPS Take 1 capsule (50,000 Units total) by mouth every 7 (seven) days.  12 capsule  1  . zolpidem (AMBIEN) 10 MG tablet Take 1 tablet (10 mg total) by mouth at bedtime as needed.  90 tablet  1  . clonazePAM (KLONOPIN) 1 MG tablet Take 1 tablet (1 mg total) by mouth as directed. Take 1 tab po q8h  270 tablet  0  . SUMAtriptan (IMITREX) 50 MG tablet 1-2  tabs po q2h prn migraine HA, max in 24h is 300 mg  9 tablet  3  . pramipexole (MIRAPEX) 0.125 MG tablet 1-4 tabs po qhs  60 tablet  0    No Known Allergies  ROS As per HPI  PE: Blood pressure 128/83, pulse 104, temperature 98.3 F (36.8 C), temperature source Temporal, height 5' 4.5" (1.638 m), weight 162 lb (73.483 kg), SpO2 94.00%. Gen: Alert, well appearing.  Patient is oriented to person, place, time, and situation. Affect is pleasant.  Thought and conversation is lucid. Palms and soles with subtle pinkish mottled appearance, without pallor or cyanosis.  Skin temp normal warmth to touch in these areas as well as areas that are not mottled.  No rashes. No joint erythema, warmth, or swelling.     LABS:  CC UA today showed orange color, + nitrite, small LEU, otherwise normal.  IMPRESSION AND PLAN:  FIBROMYALGIA, SEVERE Stable: essentially normal waxing and waning of her pain, continues to have no objective findings to support an alternate diagnosis. Her reported symptoms that may suggest the possibility of autoimmune disease (mouth sores, unexplained fevers) have never been documented in the office on exam. She has had a rash similar to malar rash noted in the office before but subsequent rheumatologist follow up for this symptom resulted in dx of rosacea and she was told that this was not a rash consistent with the classic malar rash of SLE. Miriya is frustrated, continues to push for repeated autoimmune-type work-up.  I recommended we continue current meds, obtain more routine + rheum labs, and ask another rheumatologist for a second opinion regarding the possibility of autoimmune dz as an explanation for her compilation of symptoms. She remains disabled.  Will continue monthly follow up here in office.  Pain in both feet Mild symptoms in feet>hands possibly suggestive of Raynaud's dz vs reflex sympathetic dystrophy. No intervention/work up at this time. It is an odd coincidence  that these sx's started immediately after she used an abrasive on her feet to get the superficial/flaky skin off.  RESTLESS LEG SYNDROME Improved as we ween amitriptyline down and she takes 2 requip nightly. She continues to replace her iron and hopefully this will help alleviate some of her RLS as well.  FEVER, RECURRENT Again, unclear etiology.  I have never seen her in the office at the time of one of her episodes.   Extensive lab, imaging, + multiple specialists eval has been unrevealing as to a cause.  Cystitis IS vs infectious. Given Azo taken lately I will not start abx based just on the UA in the office today. Sent urine for c/s today.  Anemia, iron deficiency Unknown etiology: no sign of blood loss on testing, she showed minimally adequate response to oral iron replacement recently so I am less suspicious of malabsorption syndrome.  Will continue iron bid and repeat labs.  De Quervain's tenosynovitis, left Improved s/p injection by Dr. Rayburn Ma. Encouraged her to still wear her splint for a couple of weeks post-injection.     FOLLOW UP: Return in about 1 month (around 01/03/2012) for f/u chronic issues.

## 2011-12-05 DIAGNOSIS — N3090 Cystitis, unspecified without hematuria: Secondary | ICD-10-CM | POA: Insufficient documentation

## 2011-12-05 LAB — ANTI-NUCLEAR AB-TITER (ANA TITER)

## 2011-12-05 LAB — IBC PANEL
Iron: 111 ug/dL (ref 42–145)
Transferrin: 247.5 mg/dL (ref 212.0–360.0)

## 2011-12-05 LAB — CYCLIC CITRUL PEPTIDE ANTIBODY, IGG: Cyclic Citrullin Peptide Ab: <2 U/mL (ref 0.0–5.0)

## 2011-12-05 NOTE — Assessment & Plan Note (Signed)
IS vs infectious. Given Azo taken lately I will not start abx based just on the UA in the office today. Sent urine for c/s today.

## 2011-12-05 NOTE — Assessment & Plan Note (Signed)
Improved as we ween amitriptyline down and she takes 2 requip nightly. She continues to replace her iron and hopefully this will help alleviate some of her RLS as well.

## 2011-12-05 NOTE — Assessment & Plan Note (Signed)
Unknown etiology: no sign of blood loss on testing, she showed minimally adequate response to oral iron replacement recently so I am less suspicious of malabsorption syndrome.  Will continue iron bid and repeat labs.

## 2011-12-05 NOTE — Assessment & Plan Note (Signed)
Mild symptoms in feet>hands possibly suggestive of Raynaud's dz vs reflex sympathetic dystrophy. No intervention/work up at this time. It is an odd coincidence that these sx's started immediately after she used an abrasive on her feet to get the superficial/flaky skin off.

## 2011-12-05 NOTE — Assessment & Plan Note (Signed)
Stable: essentially normal waxing and waning of her pain, continues to have no objective findings to support an alternate diagnosis. Her reported symptoms that may suggest the possibility of autoimmune disease (mouth sores, unexplained fevers) have never been documented in the office on exam. She has had a rash similar to malar rash noted in the office before but subsequent rheumatologist follow up for this symptom resulted in dx of rosacea and she was told that this was not a rash consistent with the classic malar rash of SLE. Drema is frustrated, continues to push for repeated autoimmune-type work-up.  I recommended we continue current meds, obtain more routine + rheum labs, and ask another rheumatologist for a second opinion regarding the possibility of autoimmune dz as an explanation for her compilation of symptoms. She remains disabled.  Will continue monthly follow up here in office.

## 2011-12-05 NOTE — Assessment & Plan Note (Signed)
Improved s/p injection by Dr. Rayburn Ma. Encouraged her to still wear her splint for a couple of weeks post-injection.

## 2011-12-05 NOTE — Assessment & Plan Note (Signed)
Again, unclear etiology.  I have never seen her in the office at the time of one of her episodes.   Extensive lab, imaging, + multiple specialists eval has been unrevealing as to a cause.

## 2011-12-06 LAB — URINE CULTURE: Colony Count: 50000

## 2011-12-22 ENCOUNTER — Inpatient Hospital Stay (HOSPITAL_COMMUNITY)
Admission: EM | Admit: 2011-12-22 | Discharge: 2011-12-24 | DRG: 195 | Disposition: A | Discharge status: Home / Self Care | Payer: 59 | Attending: Internal Medicine | Admitting: Internal Medicine

## 2011-12-22 ENCOUNTER — Emergency Department (HOSPITAL_COMMUNITY): Payer: 59

## 2011-12-22 ENCOUNTER — Encounter (HOSPITAL_COMMUNITY): Payer: Self-pay | Admitting: Emergency Medicine

## 2011-12-22 DIAGNOSIS — M654 Radial styloid tenosynovitis [de Quervain]: Secondary | ICD-10-CM

## 2011-12-22 DIAGNOSIS — R748 Abnormal levels of other serum enzymes: Secondary | ICD-10-CM | POA: Diagnosis present

## 2011-12-22 DIAGNOSIS — R5383 Other fatigue: Secondary | ICD-10-CM

## 2011-12-22 DIAGNOSIS — R609 Edema, unspecified: Secondary | ICD-10-CM

## 2011-12-22 DIAGNOSIS — R5382 Chronic fatigue, unspecified: Secondary | ICD-10-CM | POA: Diagnosis present

## 2011-12-22 DIAGNOSIS — R300 Dysuria: Secondary | ICD-10-CM

## 2011-12-22 DIAGNOSIS — R1013 Epigastric pain: Secondary | ICD-10-CM

## 2011-12-22 DIAGNOSIS — E876 Hypokalemia: Secondary | ICD-10-CM

## 2011-12-22 DIAGNOSIS — M79672 Pain in left foot: Secondary | ICD-10-CM

## 2011-12-22 DIAGNOSIS — IMO0001 Reserved for inherently not codable concepts without codable children: Secondary | ICD-10-CM | POA: Diagnosis present

## 2011-12-22 DIAGNOSIS — Z87440 Personal history of urinary (tract) infections: Secondary | ICD-10-CM

## 2011-12-22 DIAGNOSIS — J189 Pneumonia, unspecified organism: Principal | ICD-10-CM | POA: Diagnosis present

## 2011-12-22 DIAGNOSIS — E042 Nontoxic multinodular goiter: Secondary | ICD-10-CM

## 2011-12-22 DIAGNOSIS — K219 Gastro-esophageal reflux disease without esophagitis: Secondary | ICD-10-CM

## 2011-12-22 DIAGNOSIS — A413 Sepsis due to Hemophilus influenzae: Secondary | ICD-10-CM

## 2011-12-22 DIAGNOSIS — A419 Sepsis, unspecified organism: Secondary | ICD-10-CM

## 2011-12-22 DIAGNOSIS — R110 Nausea: Secondary | ICD-10-CM

## 2011-12-22 DIAGNOSIS — F329 Major depressive disorder, single episode, unspecified: Secondary | ICD-10-CM

## 2011-12-22 DIAGNOSIS — K5909 Other constipation: Secondary | ICD-10-CM

## 2011-12-22 DIAGNOSIS — A689 Relapsing fever, unspecified: Secondary | ICD-10-CM | POA: Diagnosis present

## 2011-12-22 DIAGNOSIS — D696 Thrombocytopenia, unspecified: Secondary | ICD-10-CM

## 2011-12-22 DIAGNOSIS — M79671 Pain in right foot: Secondary | ICD-10-CM

## 2011-12-22 DIAGNOSIS — G9332 Myalgic encephalomyelitis/chronic fatigue syndrome: Secondary | ICD-10-CM | POA: Diagnosis present

## 2011-12-22 DIAGNOSIS — R509 Fever, unspecified: Secondary | ICD-10-CM | POA: Diagnosis present

## 2011-12-22 DIAGNOSIS — R5381 Other malaise: Secondary | ICD-10-CM

## 2011-12-22 DIAGNOSIS — F411 Generalized anxiety disorder: Secondary | ICD-10-CM | POA: Diagnosis present

## 2011-12-22 DIAGNOSIS — D509 Iron deficiency anemia, unspecified: Secondary | ICD-10-CM

## 2011-12-22 DIAGNOSIS — G2581 Restless legs syndrome: Secondary | ICD-10-CM

## 2011-12-22 DIAGNOSIS — G8929 Other chronic pain: Secondary | ICD-10-CM

## 2011-12-22 DIAGNOSIS — I959 Hypotension, unspecified: Secondary | ICD-10-CM | POA: Diagnosis present

## 2011-12-22 DIAGNOSIS — J45909 Unspecified asthma, uncomplicated: Secondary | ICD-10-CM | POA: Diagnosis present

## 2011-12-22 DIAGNOSIS — IMO0002 Reserved for concepts with insufficient information to code with codable children: Secondary | ICD-10-CM | POA: Diagnosis present

## 2011-12-22 HISTORY — DX: Irritable bowel syndrome, unspecified: K58.9

## 2011-12-22 LAB — COMPREHENSIVE METABOLIC PANEL
AST: 26 U/L (ref 0–37)
Albumin: 3.5 g/dL (ref 3.5–5.2)
Alkaline Phosphatase: 95 U/L (ref 39–117)
Chloride: 94 mEq/L — ABNORMAL LOW (ref 96–112)
Potassium: 2.4 mEq/L — CL (ref 3.5–5.1)
Total Bilirubin: 0.3 mg/dL (ref 0.3–1.2)
Total Protein: 6.5 g/dL (ref 6.0–8.3)

## 2011-12-22 LAB — BASIC METABOLIC PANEL
GFR calc Af Amer: >90 mL/min (ref >90)
GFR calc non Af Amer: >90 mL/min (ref >90)
Glucose, Bld: 126 mg/dL — ABNORMAL HIGH (ref 70–99)
Potassium: 4.1 mEq/L (ref 3.5–5.1)
Sodium: 134 mEq/L — ABNORMAL LOW (ref 135–145)

## 2011-12-22 LAB — URINALYSIS, ROUTINE W REFLEX MICROSCOPIC
Bilirubin Urine: NEGATIVE
Nitrite: NEGATIVE
Specific Gravity, Urine: <1.005 — ABNORMAL LOW (ref 1.005–1.030)
Urobilinogen, UA: 0.2 mg/dL (ref 0.0–1.0)
pH: 6 (ref 5.0–8.0)

## 2011-12-22 LAB — CBC
MCHC: 34.4 g/dL (ref 30.0–36.0)
Platelets: 192 10*3/uL (ref 150–400)
RDW: 13.5 % (ref 11.5–15.5)
WBC: 8.9 10*3/uL (ref 4.0–10.5)

## 2011-12-22 LAB — INFLUENZA PANEL BY PCR (TYPE A & B): Influenza A By PCR: NEGATIVE

## 2011-12-22 LAB — URINE MICROSCOPIC-ADD ON

## 2011-12-22 LAB — LIPASE, BLOOD: Lipase: 14 U/L (ref 11–59)

## 2011-12-22 MED ORDER — POLYETHYLENE GLYCOL 3350 17 GM/SCOOP PO POWD: 17.0000 g | Freq: Every day | ORAL | Status: DC | PRN

## 2011-12-22 MED ORDER — ACETAMINOPHEN 500 MG PO TABS
1000.0000 mg | ORAL_TABLET | Freq: Once | ORAL | Status: AC
  Administered 2011-12-22: 1000 mg via ORAL
  Filled 2011-12-22: qty 2

## 2011-12-22 MED ORDER — PANTOPRAZOLE SODIUM 40 MG PO TBEC
40.0000 mg | DELAYED_RELEASE_TABLET | Freq: Every day | ORAL | Status: DC
  Administered 2011-12-22 – 2011-12-24 (×3): 40 mg via ORAL
  Filled 2011-12-22 (×3): qty 1

## 2011-12-22 MED ORDER — ALBUTEROL SULFATE HFA 108 (90 BASE) MCG/ACT IN AERS
2.0000 | INHALATION_SPRAY | RESPIRATORY_TRACT | Status: DC | PRN
  Administered 2011-12-23 (×2): 2 via RESPIRATORY_TRACT
  Filled 2011-12-22 (×2): qty 6.7

## 2011-12-22 MED ORDER — SODIUM CHLORIDE 0.9 % IV SOLN
INTRAVENOUS | Status: DC
  Administered 2011-12-22 – 2011-12-24 (×3): via INTRAVENOUS

## 2011-12-22 MED ORDER — IOHEXOL 300 MG/ML  SOLN
40.0000 mL | Freq: Once | INTRAMUSCULAR | Status: AC | PRN
  Administered 2011-12-22: 40 mL via ORAL

## 2011-12-22 MED ORDER — LIDOCAINE HCL (CARDIAC) 20 MG/ML IV SOLN
INTRAVENOUS | Status: AC
  Filled 2011-12-22: qty 5

## 2011-12-22 MED ORDER — LIDOCAINE HCL 2 % EX GEL
Freq: Once | CUTANEOUS | Status: AC
  Administered 2011-12-22: 10 via TOPICAL

## 2011-12-22 MED ORDER — SODIUM CHLORIDE 0.9 % IV SOLN: INTRAVENOUS | Status: AC

## 2011-12-22 MED ORDER — AZITHROMYCIN 500 MG IV SOLR
500.0000 mg | INTRAVENOUS | Status: DC
  Administered 2011-12-22 – 2011-12-23 (×2): 500 mg via INTRAVENOUS
  Filled 2011-12-22 (×4): qty 500

## 2011-12-22 MED ORDER — HEPARIN SODIUM (PORCINE) 5000 UNIT/ML IJ SOLN
5000.0000 [IU] | Freq: Three times a day (TID) | INTRAMUSCULAR | Status: DC
  Administered 2011-12-22 – 2011-12-24 (×5): 5000 [IU] via SUBCUTANEOUS
  Filled 2011-12-22 (×5): qty 1

## 2011-12-22 MED ORDER — POTASSIUM CHLORIDE 10 MEQ/100ML IV SOLN
10.0000 meq | Freq: Once | INTRAVENOUS | Status: AC
  Administered 2011-12-22: 10 meq via INTRAVENOUS
  Filled 2011-12-22 (×2): qty 100

## 2011-12-22 MED ORDER — POTASSIUM CHLORIDE CRYS ER 20 MEQ PO TBCR
40.0000 meq | EXTENDED_RELEASE_TABLET | Freq: Once | ORAL | Status: AC
Start: 2011-12-22 — End: 2011-12-22
  Administered 2011-12-22: 40 meq via ORAL
  Filled 2011-12-22: qty 2

## 2011-12-22 MED ORDER — FLUTICASONE-SALMETEROL 250-50 MCG/DOSE IN AEPB
INHALATION_SPRAY | RESPIRATORY_TRACT | Status: AC
  Filled 2011-12-22: qty 14

## 2011-12-22 MED ORDER — DEXTROSE 5 % IV SOLN
1.0000 g | INTRAVENOUS | Status: DC
  Administered 2011-12-23: 1 g via INTRAVENOUS
  Filled 2011-12-22 (×3): qty 10

## 2011-12-22 MED ORDER — IOHEXOL 300 MG/ML  SOLN
100.0000 mL | Freq: Once | INTRAMUSCULAR | Status: AC | PRN
  Administered 2011-12-22: 100 mL via INTRAVENOUS

## 2011-12-22 MED ORDER — SODIUM CHLORIDE 0.9 % IV BOLUS (SEPSIS)
1000.0000 mL | Freq: Once | INTRAVENOUS | Status: AC
  Administered 2011-12-22: 1000 mL via INTRAVENOUS

## 2011-12-22 MED ORDER — VANCOMYCIN HCL IN DEXTROSE 1-5 GM/200ML-% IV SOLN: 1000.0000 mg | Freq: Once | INTRAVENOUS | Status: DC

## 2011-12-22 MED ORDER — SUMATRIPTAN SUCCINATE 50 MG PO TABS
50.0000 mg | ORAL_TABLET | ORAL | Status: DC | PRN
  Filled 2011-12-22: qty 1

## 2011-12-22 MED ORDER — PRAMIPEXOLE DIHYDROCHLORIDE 0.25 MG PO TABS
0.1250 mg | ORAL_TABLET | Freq: Every day | ORAL | Status: DC
  Administered 2011-12-22: 0.5 mg via ORAL
  Administered 2011-12-23: 0.25 mg via ORAL
  Filled 2011-12-22: qty 2
  Filled 2011-12-22: qty 4
  Filled 2011-12-22 (×2): qty 2

## 2011-12-22 MED ORDER — OXYCODONE HCL 60 MG PO TB12: 1.0000 | ORAL_TABLET | Freq: Two times a day (BID) | ORAL | Status: DC

## 2011-12-22 MED ORDER — PIPERACILLIN-TAZOBACTAM 3.375 G IVPB
3.3750 g | Freq: Once | INTRAVENOUS | Status: AC
  Administered 2011-12-22: 3.375 g via INTRAVENOUS
  Filled 2011-12-22: qty 50

## 2011-12-22 MED ORDER — CARISOPRODOL 250 MG PO TABS: 250.0000 mg | ORAL_TABLET | ORAL | Status: DC | PRN

## 2011-12-22 MED ORDER — SODIUM CHLORIDE 0.9 % IV BOLUS (SEPSIS)
500.0000 mL | Freq: Once | INTRAVENOUS | Status: AC
  Administered 2011-12-22: 500 mL via INTRAVENOUS

## 2011-12-22 MED ORDER — CARISOPRODOL 350 MG PO TABS
350.0000 mg | ORAL_TABLET | Freq: Three times a day (TID) | ORAL | Status: DC | PRN
  Administered 2011-12-23 (×2): 350 mg via ORAL
  Filled 2011-12-22 (×4): qty 1

## 2011-12-22 MED ORDER — ONDANSETRON HCL 4 MG/2ML IJ SOLN
4.0000 mg | Freq: Once | INTRAMUSCULAR | Status: AC
  Administered 2011-12-22: 4 mg via INTRAVENOUS
  Filled 2011-12-22: qty 2

## 2011-12-22 MED ORDER — DOPAMINE-DEXTROSE 3.2-5 MG/ML-% IV SOLN
2.0000 ug/kg/min | INTRAVENOUS | Status: DC
  Administered 2011-12-22: 2 ug/kg/min via INTRAVENOUS
  Filled 2011-12-22: qty 250

## 2011-12-22 MED ORDER — HYDROMORPHONE HCL PF 1 MG/ML IJ SOLN
1.0000 mg | Freq: Once | INTRAMUSCULAR | Status: AC
  Administered 2011-12-22: 1 mg via INTRAVENOUS
  Filled 2011-12-22: qty 1

## 2011-12-22 MED ORDER — ONDANSETRON HCL 4 MG/2ML IJ SOLN
4.0000 mg | Freq: Three times a day (TID) | INTRAMUSCULAR | Status: AC | PRN
  Administered 2011-12-22: 4 mg via INTRAVENOUS
  Filled 2011-12-22: qty 2

## 2011-12-22 MED ORDER — AMITRIPTYLINE HCL 25 MG PO TABS
50.0000 mg | ORAL_TABLET | Freq: Every day | ORAL | Status: DC
  Administered 2011-12-22 – 2011-12-23 (×2): 50 mg via ORAL
  Filled 2011-12-22 (×2): qty 2

## 2011-12-22 MED ORDER — FLUTICASONE-SALMETEROL 250-50 MCG/DOSE IN AEPB
1.0000 | INHALATION_SPRAY | Freq: Two times a day (BID) | RESPIRATORY_TRACT | Status: DC
  Administered 2011-12-23 – 2011-12-24 (×3): 1 via RESPIRATORY_TRACT
  Filled 2011-12-22: qty 14

## 2011-12-22 MED ORDER — CEFTRIAXONE SODIUM 1 G IJ SOLR
1.0000 g | Freq: Once | INTRAMUSCULAR | Status: AC
  Administered 2011-12-22: 1 g via INTRAVENOUS
  Filled 2011-12-22: qty 10

## 2011-12-22 MED ORDER — POTASSIUM CHLORIDE 10 MEQ/100ML IV SOLN
10.0000 meq | Freq: Once | INTRAVENOUS | Status: AC
  Administered 2011-12-22: 10 meq via INTRAVENOUS

## 2011-12-22 MED ORDER — DIPHENHYDRAMINE HCL 25 MG PO CAPS
25.0000 mg | ORAL_CAPSULE | ORAL | Status: DC | PRN
  Administered 2011-12-22: 25 mg via ORAL
  Filled 2011-12-22: qty 1

## 2011-12-22 MED ORDER — CLONAZEPAM 0.5 MG PO TABS
1.0000 mg | ORAL_TABLET | Freq: Three times a day (TID) | ORAL | Status: DC | PRN
  Administered 2011-12-23 – 2011-12-24 (×2): 1 mg via ORAL
  Filled 2011-12-22 (×2): qty 2

## 2011-12-22 MED ORDER — ZOLPIDEM TARTRATE 5 MG PO TABS
10.0000 mg | ORAL_TABLET | Freq: Every day | ORAL | Status: DC
  Administered 2011-12-22 – 2011-12-23 (×2): 10 mg via ORAL
  Filled 2011-12-22 (×2): qty 2

## 2011-12-22 MED ORDER — POLYETHYLENE GLYCOL 3350 17 G PO PACK
17.0000 g | PACK | Freq: Every day | ORAL | Status: DC | PRN
  Administered 2011-12-24: 17 g via ORAL
  Filled 2011-12-22: qty 1

## 2011-12-22 MED ORDER — DEXTROSE 5 % IV SOLN: 500.0000 mg | INTRAVENOUS | Status: DC

## 2011-12-22 MED ORDER — OXYCODONE HCL 20 MG PO TB12
60.0000 mg | ORAL_TABLET | Freq: Two times a day (BID) | ORAL | Status: DC
  Administered 2011-12-22 – 2011-12-24 (×5): 60 mg via ORAL
  Filled 2011-12-22: qty 3
  Filled 2011-12-22: qty 1
  Filled 2011-12-22 (×4): qty 3

## 2011-12-22 MED ORDER — OXYCODONE HCL 5 MG PO TABS
5.0000 mg | ORAL_TABLET | ORAL | Status: DC | PRN
  Administered 2011-12-22 – 2011-12-23 (×3): 10 mg via ORAL
  Administered 2011-12-23: 5 mg via ORAL
  Administered 2011-12-23: 10 mg via ORAL
  Administered 2011-12-24: 5 mg via ORAL
  Filled 2011-12-22 (×4): qty 2
  Filled 2011-12-22: qty 1
  Filled 2011-12-22 (×2): qty 2

## 2011-12-22 NOTE — Progress Notes (Signed)
Paged Dr. Karilyn Cota to let him know about pt's current BP of 79/44.  Will continue to monitor and give NS 500cc bolus as ordered.

## 2011-12-22 NOTE — ED Notes (Signed)
Pt reports that she is beginning to experience a "sharp pain in my lungs" with breathing  Dr. Deretha Emory notified,

## 2011-12-22 NOTE — H&P (Signed)
DONNETTE MACMULLEN MRN: [id number] DOB/AGE: 1966-03-03 46 y.o. Primary Care Physician:MCGOWEN,PHILIP H, MD, MD Admit date: 12/22/2011 Chief Complaint: Fever, dry cough, dyspnea. HPI: This 46 year old lady has had the above symptoms for the last 12-18 hours. She has a history of fevers dating back to October of 2012. She has been investigated by infectious disease and no source was found. However this fever started again as above. She came to the emergency room and was found to be hypotensive. A chest x-ray showing right lower lobe pneumonia. CT scan of the chest confirmed right middle and lower lobe pneumonia. She has also had epigastric pain which is likely related to her pneumonia. She has a history of fibromyalgia. She is requiring supplemental oxygen in the emergency room.  Past Medical History  Diagnosis Date  . DDD (degenerative disc disease), lumbar   . Atypical chest pain     Question of coronary vasospasm; cath 10/20/10 NORMAL  . Anxiety   . Fibromyalgia     Dr. Hope Pigeon [phone]  . Interstitial cystitis   . Obesity   . Insomnia   . Transaminasemia 03/2010    (ALT 49), normal on f/u testing off of daily tylenol uuse.  Marland Kitchen GERD (gastroesophageal reflux disease) 06/01/10    Dyspepsia/gastritis, EGD 05/2010: gastritis (NSAIDS)  . Rectal bleeding 06/01/10    Anorectal bleeding/BRBPR--colonoscopy: internal hemorrhoids  . Recurrent UTI     renal u/s normal 07/2007  . Weight loss     CT abd/pelf 08/22/10 remakable only for bile duct 9mm (unchanged from 03/2004 PRIOR to ERCP w/spincterotomy, bile duct balloon dredging and lap chole)  . Recurrent fever     W/u unrevealing, including ID consult.    . Asthma   . Osteopenia     bone densitometry 2006 per pt  . 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 Christella Hartigan  . Multinodular goiter 11/2010    TSH normal  . Chest pain   . Rectal prolapse 02/2011    repaired lap [id number]  . Vitamin d deficiency 08/20/2011  .  History of migraine headaches     Dx'd around 2009  . Edema 10/31/2011  . Pharyngitis 10/31/2011  . IBS (irritable bowel syndrome)    Past Surgical History  Procedure Date  . Bunionectomy     right foot  . Abdominal hysterectomy   . Bilateral salpingoophorectomy 2003    endometriosis  . Cholecystectomy 2005    lap--(cholelithiasis w/choledocholithiasis on u/s 2005--PreOperative ERCP showed NO stone or other obstruction in the biliary tract, but CBD messured 9mm near head of pancreas.  S sphincterotomy and bile duct balloon dredging was done at that time.  MRCP 10/2010 NORMAL.  Marland Kitchen Orif metatarsal fracture 2006    Right 2nd and 3rd metatarsals (Dr. Romeo Apple)  . Biopsy thyroid 11/2010    Fine needle: NONmalignant goiter  . Rectal prolapse repair, rectopexy 05July2012    with sigmoid colectomy        Family History  Problem Relation Age of Onset  . Dementia Mother     Picks Disease  . Hypertension Mother   . Cancer Father     testicular/ melanoma  . Thyroid disease Sister   . Alcohol abuse Maternal Grandmother   . Other Maternal Grandmother     brain tumor (unsure if cancerous)  . Heart attack Maternal Grandfather   . Cancer Paternal Grandfather     colon    Social History:  Married, lives with her husband. Does not smoke cigarettes.  Does not drink alcohol.  Allergies: No Known Allergies       ZOX:WRUEA from the symptoms mentioned above,there are no other symptoms referable to all systems reviewed.  Physical Exam: Blood pressure 111/71, pulse 104, temperature 100.4 F (38 C), temperature source Oral, resp. rate 24, height 5' 4.5" (1.638 m), weight 71.668 kg (158 lb), SpO2 95.00%. She is apyrexial. She does not look particularly toxic-appearing. She is very alert and orientated. Heart sounds are present with a resting tachycardia in sinus rhythm. Lung fields are actually completely clear with no evidence of wheezing, crackles or bronchial breathing. Abdomen is soft and  nontender. There is no hepatosplenomegaly. There no focal neurological signs. She is not delirious.    Basename 12/22/11 1030  WBC 8.9  NEUTROABS --  HGB 11.6*  HCT 33.7*  MCV 88.7  PLT 192    Basename 12/22/11 1030  NA 132*  K 2.4*  CL 94*  CO2 23  GLUCOSE 123*  BUN 10  CREATININE 0.66  CALCIUM 9.0  MG --      Recent Results (from the past 240 hour(s))  CULTURE, BLOOD (ROUTINE X 2)     Status: Normal (Preliminary result)   Collection Time   12/22/11 10:36 AM      Component Value Range Status Comment   Specimen Description BLOOD LEFT FOREARM   Final    Special Requests     Final    Value: BOTTLES DRAWN AEROBIC AND ANAEROBIC 8CC EACH BOTTLE   Culture PENDING   Incomplete    Report Status PENDING   Incomplete   CULTURE, BLOOD (ROUTINE X 2)     Status: Normal (Preliminary result)   Collection Time   12/22/11 10:37 AM      Component Value Range Status Comment   Specimen Description BLOOD RIGHT ARM   Final    Special Requests     Final    Value: BOTTLES DRAWN AEROBIC AND ANAEROBIC 8CC EACH BOTTLE   Culture PENDING   Incomplete    Report Status PENDING   Incomplete      Ct Abdomen Pelvis W Contrast  12/22/2011  *RADIOLOGY REPORT*  Clinical Data: Upper abdominal pain.  High fever.  CT ABDOMEN AND PELVIS WITH CONTRAST  Technique:  Multidetector CT imaging of the abdomen and pelvis was performed following the standard protocol during bolus administration of intravenous contrast.  Contrast: OMNIPAQUE IOHEXOL 300 MG/ML  SOLN  Comparison: CT of abdomen and pelvis 08/22/2010.  Findings:  Lung Bases: There is extensive air space consolidation throughout the visualized portions of both the right middle and right lower lobe, compatible with pneumonia.  Left lung base appears relatively clear.  Abdomen/Pelvis:  There is a small amount of pneumobilia in the left hepatic bile ducts.  A small amount of periportal edema is noted throughout the liver.  The patient is status post  cholecystectomy. The common bile duct is 8 mm within the porta hepatis (within normal limits in this postcholecystectomy patient).  Enhanced appearance of the pancreas, spleen, bilateral adrenal glands and left kidney is unremarkable.  There is a right-sided extrarenal pelvis (normal anatomical variant) incidentally noted.  There is a Foley balloon catheter in place, and a small amount of gas in the nondependent portion of the urinary bladder, presumably iatrogenic.  The patient is status post hysterectomy.  Ovaries are not confidently identified and may be either atrophic or surgically absent.  There is no significant volume of ascites.  No pneumoperitoneum. Multiple borderline and mildly  dilated loops of small bowel are noted, measuring up to approximately 3.1 cm in diameter.  However, there is gas and stool scattered throughout the colon extending to the distal rectum.  Numerous reactive size lymph nodes are noted throughout the retroperitoneum and the abdominal mesentery, but none of these meat CT criteria for pathologic enlargement.  Musculoskeletal: There appears to be a surgical mesh along the anterior surface of the sacrum, likely from prior rectopexy. There are no aggressive appearing lytic or blastic lesions noted in the visualized portions of the skeleton.  IMPRESSION: 1.  Extensive air space consolidation throughout the right middle and lower lobes, compatible with pneumonia.  This likely accounts for the patient's high fever. 2.  Small amount of pneumobilia which is new compared to the prior study.  However, this is presumably related to the patient's history of prior sphincterotomy performed in 2005.  3.  In addition, there is periportal edema which is nonspecific, but can be seen in the setting of acute hepatitis.  Clinical correlation is recommended. 4.  Multiple borderline dilated and mildly dilated loops of small bowel.  This is highly nonspecific, and not favored to represent obstruction as there  is extensive gas and stool noted throughout the colon.  However, clinical correlation is recommended. 5. Status post cholecystectomy, hysterectomy and rectopexy.  Original Report Authenticated By: Florencia Reasons, M.D.   Dg Chest Portable 1 View  12/22/2011  *RADIOLOGY REPORT*  Clinical Data: Fever.  Abdominal pain and chills.  PORTABLE CHEST - 1 VIEW  Comparison: Chest and rib films 05/28/2011.  Findings: The heart size is normal.  A new right lower lobe pneumonia is present.  Minimal airspace disease at the left base may represent atelectasis or pneumonia.  The visualized soft tissues and bony thorax are unremarkable.  IMPRESSION:  1.  Right lower lobe pneumonia. 2.  Minimal left basilar airspace disease may represent atelectasis.  Infection is not excluded.  Original Report Authenticated By: Jamesetta Orleans. MATTERN, M.D.   Impression: 1. Community acquired pneumonia with sepsis. 2. Fibromyalgia. 3. History of recurrent fevers in the past.     Plan: 1. Admit to step down unit. Monitor hemodynamically. 2. Intravenous antibiotics for community-acquired pneumonia. 3. Supplemental oxygen as required. Further recommendations will depend on patient's hospital progress.      Wilson Singer Pager [phone]  12/22/2011, 1:25 PM

## 2011-12-22 NOTE — ED Notes (Signed)
Pt c/o low grade fever and abd pain since last night.

## 2011-12-22 NOTE — ED Provider Notes (Signed)
History   This chart was scribed for Shelda Jakes, MD by Clarita Crane. The patient was seen in room APA06/APA06. Patient's care was started at 0934.    CSN: [id number]  Arrival date & time 12/22/11  6295   First MD Initiated Contact with Patient 12/22/11 0935      Chief Complaint  Patient presents with  . Fever  . Abdominal Pain  . Chills    (Consider location/radiation/quality/duration/timing/severity/associated sxs/prior treatment) HPI Alexandra Reid is a 46 y.o. female who presents to the Emergency Department complaining of constant moderate to severe upper abdominal pain radiating to back onset 10.5 hours ago and persistent since with associated nausea, chills, fever which began this morning and has been measured as high as 105.3, HA, chest pain, mild congestion and wheezing. Rates abdominal pain an 8 out of 10 currently. States current abdominal pain is not similar to that previously experienced with IBS and gastritis. Reports nausea was improved with use of Zofran. Denies vomiting, diarrhea, sore throat, SOB, visual changes, dysuria, rash, swelling of joints/extremities. Patient with h/o DDD, fibromyalgia, interstitial cystitis, GERD, UTI, gastritis, migraines, cholecystectomy, hysterectomy, rectal prolapse repair performed by Dr. Luiz Blare.   Past Medical History  Diagnosis Date  . DDD (degenerative disc disease), lumbar   . Atypical chest pain     Question of coronary vasospasm; cath 10/20/10 NORMAL  . Anxiety   . Fibromyalgia     Dr. Hope Pigeon [phone]  . Interstitial cystitis   . Obesity   . Insomnia   . Transaminasemia 03/2010    (ALT 49), normal on f/u testing off of daily tylenol uuse.  Marland Kitchen GERD (gastroesophageal reflux disease) 06/01/10    Dyspepsia/gastritis, EGD 05/2010: gastritis (NSAIDS)  . Rectal bleeding 06/01/10    Anorectal bleeding/BRBPR--colonoscopy: internal hemorrhoids  . Recurrent UTI     renal u/s normal 07/2007  . Weight loss     CT abd/pelf  08/22/10 remakable only for bile duct 9mm (unchanged from 03/2004 PRIOR to ERCP w/spincterotomy, bile duct balloon dredging and lap chole)  . Recurrent fever     W/u unrevealing, including ID consult.    . Asthma   . Osteopenia     bone densitometry 2006 per pt  . 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 Christella Hartigan  . Multinodular goiter 11/2010    TSH normal  . Chest pain   . Rectal prolapse 02/2011    repaired lap [id number]  . Vitamin d deficiency 08/20/2011  . History of migraine headaches     Dx'd around 2009  . Edema 10/31/2011  . Pharyngitis 10/31/2011  . IBS (irritable bowel syndrome)     Past Surgical History  Procedure Date  . Bunionectomy     right foot  . Abdominal hysterectomy   . Bilateral salpingoophorectomy 2003    endometriosis  . Cholecystectomy 2005    lap--(cholelithiasis w/choledocholithiasis on u/s 2005--PreOperative ERCP showed NO stone or other obstruction in the biliary tract, but CBD messured 9mm near head of pancreas.  S sphincterotomy and bile duct balloon dredging was done at that time.  MRCP 10/2010 NORMAL.  Marland Kitchen Orif metatarsal fracture 2006    Right 2nd and 3rd metatarsals (Dr. Romeo Apple)  . Biopsy thyroid 11/2010    Fine needle: NONmalignant goiter  . Rectal prolapse repair, rectopexy 05July2012    with sigmoid colectomy    Family History  Problem Relation Age of Onset  . Dementia Mother     Picks Disease  . Hypertension  Mother   . Cancer Father     testicular/ melanoma  . Thyroid disease Sister   . Alcohol abuse Maternal Grandmother   . Other Maternal Grandmother     brain tumor (unsure if cancerous)  . Heart attack Maternal Grandfather   . Cancer Paternal Grandfather     colon    History  Substance Use Topics  . Smoking status: Never Smoker   . Smokeless tobacco: Never Used  . Alcohol Use: No     special occasion    OB History    Grav Para Term Preterm Abortions TAB SAB Ect Mult Living                   Review of Systems  Constitutional: Positive for fever and chills.  HENT: Positive for congestion. Negative for rhinorrhea.   Eyes: Negative for pain.  Respiratory: Positive for wheezing. Negative for cough and shortness of breath.   Cardiovascular: Positive for chest pain.  Gastrointestinal: Positive for nausea and abdominal pain. Negative for vomiting and diarrhea.  Genitourinary: Negative for dysuria.  Musculoskeletal: Negative for back pain.  Skin: Negative for rash.  Neurological: Positive for headaches. Negative for weakness.    Allergies  Review of patient's allergies indicates no known allergies.  Home Medications   Current Outpatient Rx  Name Route Sig Dispense Refill  . ALBUTEROL SULFATE HFA 108 (90 BASE) MCG/ACT IN AERS Inhalation Inhale 2 puffs into the lungs every 4 (four) hours as needed. Shortness of breath    . AMITRIPTYLINE HCL 100 MG PO TABS Oral Take 50 mg by mouth at bedtime.    Marland Kitchen AMLODIPINE BESYLATE 5 MG PO TABS Oral Take 5 mg by mouth daily.    Marland Kitchen VITAMIN C 500 MG PO TABS Oral Take 500 mg by mouth daily.     Marland Kitchen CALCIUM 500 PO Oral Take 1 tablet by mouth daily.     Marland Kitchen CARISOPRODOL 250 MG PO TABS Oral Take 250 mg by mouth as needed. As needed for pain    . CLONAZEPAM 1 MG PO TABS Oral Take 1 mg by mouth 3 (three) times daily as needed. As needed for anxiety, takes one every night for sleep    . ESOMEPRAZOLE MAGNESIUM 40 MG PO CPDR Oral Take 40 mg by mouth 2 (two) times daily. 1 tab po bid    . FISH OIL OIL Oral Take 2 capsules by mouth daily.     Marland Kitchen FLUTICASONE-SALMETEROL 250-50 MCG/DOSE IN AEPB Inhalation Inhale 1 puff into the lungs every 12 (twelve) hours.     Marland Kitchen HYOSCYAMINE SULFATE 0.125 MG SL SUBL Oral Take 1-2 tablets (0.125-0.25 mg total) by mouth every 6 (six) hours as needed for cramping. 90 tablet 3  . MAGNESIUM PO Oral Take 1 tablet by mouth daily. Unknown strength    . MELOXICAM 7.5 MG PO TABS Oral Take 7.5 mg by mouth daily.    Marland Kitchen ONDANSETRON 8 MG PO  TBDP Oral Take 1 tablet (8 mg total) by mouth every 8 (eight) hours as needed. For nausea 20 tablet 3  . OXYCODONE HCL 5 MG PO TABS Oral Take 5-10 mg by mouth every 4 (four) hours as needed. 1-2 tabs po q 4 hours prn breakthrough pain    . OXYCODONE HCL ER 60 MG PO TB12 Oral Take 1 tablet by mouth 2 (two) times daily. Pain contract in chart    . PHENAZOPYRIDINE HCL 200 MG PO TABS      . POLYETHYLENE GLYCOL 3350  PO POWD Oral Take 17 g by mouth daily as needed. for constipation    . PRAMIPEXOLE DIHYDROCHLORIDE 0.125 MG PO TABS Oral Take 0.125-0.5 mg by mouth at bedtime. 1-4 tabs po qhs    . SUMATRIPTAN SUCCINATE 50 MG PO TABS Oral Take 50 mg by mouth every 2 (two) hours as needed. 1-2 tabs po q2h prn migraine HA, max in 24h is 300 mg    . ZOLPIDEM TARTRATE 10 MG PO TABS Oral Take 10 mg by mouth at bedtime.    . FUROSEMIDE 20 MG PO TABS Oral Take 20 mg by mouth daily as needed. For swelling      BP 111/71  Pulse 104  Temp(Src) 100.4 F (38 C) (Oral)  Resp 24  Ht 5' 4.5" (1.638 m)  Wt 158 lb (71.668 kg)  BMI 26.70 kg/m2  SpO2 95%  Physical Exam  Nursing note and vitals reviewed. Constitutional: She is oriented to person, place, and time. She appears well-developed and well-nourished. No distress.  HENT:  Head: Normocephalic and atraumatic.  Eyes: EOM are normal. Pupils are equal, round, and reactive to light.  Neck: Neck supple. No tracheal deviation present.  Cardiovascular: Regular rhythm.  Tachycardia present.  Exam reveals no gallop and no friction rub.   No murmur heard.      Cap refill intact. DP pulses intact bilaterally.   Pulmonary/Chest: Effort normal. No respiratory distress. She has no wheezes. She has no rales.  Abdominal: Soft. Bowel sounds are normal. She exhibits no distension and no mass. There is tenderness in the right upper quadrant and left upper quadrant. There is no rebound and no guarding.  Musculoskeletal: Normal range of motion. She exhibits no edema.   Neurological: She is alert and oriented to person, place, and time. No sensory deficit.  Skin: Skin is warm and dry. No rash noted.       Diffuse redness to face and chest which blanches to touch.   Psychiatric: She has a normal mood and affect. Her behavior is normal.  Vitals- Hypotensive, Febrile, Tachycardic  ED Course  Procedures (including critical care time)  DIAGNOSTIC STUDIES: Oxygen Saturation is 94% on room air, adequate by my interpretation.    COORDINATION OF CARE: 10:15AM- Patient informed of current plan for treatment and evaluation and agrees with plan at this time.  10:54AM- Patient still tachycardic at this time, 123 BPM, blood pressure measured 109/59 and oxygen saturation 88% on room air. Patient informed of probable admission. 12:51PM- Consult complete with hospitalist. Patient case explained and discussed.   Results for orders placed during the hospital encounter of 12/22/11  CBC      Component Value Range   WBC 8.9  4.0 - 10.5 (K/uL)   RBC 3.80 (*) 3.87 - 5.11 (MIL/uL)   Hemoglobin 11.6 (*) 12.0 - 15.0 (g/dL)   HCT 16.1 (*) 09.6 - 46.0 (%)   MCV 88.7  78.0 - 100.0 (fL)   MCH 30.5  26.0 - 34.0 (pg)   MCHC 34.4  30.0 - 36.0 (g/dL)   RDW 04.5  40.9 - 81.1 (%)   Platelets 192  150 - 400 (K/uL)  COMPREHENSIVE METABOLIC PANEL      Component Value Range   Sodium 132 (*) 135 - 145 (mEq/L)   Potassium 2.4 (*) 3.5 - 5.1 (mEq/L)   Chloride 94 (*) 96 - 112 (mEq/L)   CO2 23  19 - 32 (mEq/L)   Glucose, Bld 123 (*) 70 - 99 (mg/dL)   BUN 10  6 - 23 (mg/dL)   Creatinine, Ser 1.02  0.50 - 1.10 (mg/dL)   Calcium 9.0  8.4 - 72.5 (mg/dL)   Total Protein 6.5  6.0 - 8.3 (g/dL)   Albumin 3.5  3.5 - 5.2 (g/dL)   AST 26  0 - 37 (U/L)   ALT 20  0 - 35 (U/L)   Alkaline Phosphatase 95  39 - 117 (U/L)   Total Bilirubin 0.3  0.3 - 1.2 (mg/dL)   GFR calc non Af Amer >90  >90 (mL/min)   GFR calc Af Amer >90  >90 (mL/min)  LACTIC ACID, PLASMA      Component Value Range   Lactic  Acid, Venous 2.7 (*) 0.5 - 2.2 (mmol/L)  LIPASE, BLOOD      Component Value Range   Lipase 14  11 - 59 (U/L)  URINALYSIS, ROUTINE W REFLEX MICROSCOPIC      Component Value Range   Color, Urine YELLOW  YELLOW    APPearance CLEAR  CLEAR    Specific Gravity, Urine <1.005 (*) 1.005 - 1.030    pH 6.0  5.0 - 8.0    Glucose, UA NEGATIVE  NEGATIVE (mg/dL)   Hgb urine dipstick TRACE (*) NEGATIVE    Bilirubin Urine NEGATIVE  NEGATIVE    Ketones, ur NEGATIVE  NEGATIVE (mg/dL)   Protein, ur NEGATIVE  NEGATIVE (mg/dL)   Urobilinogen, UA 0.2  0.0 - 1.0 (mg/dL)   Nitrite NEGATIVE  NEGATIVE    Leukocytes, UA NEGATIVE  NEGATIVE   CULTURE, BLOOD (ROUTINE X 2)      Component Value Range   Specimen Description BLOOD LEFT FOREARM     Special Requests       Value: BOTTLES DRAWN AEROBIC AND ANAEROBIC 8CC EACH BOTTLE   Culture PENDING     Report Status PENDING    CULTURE, BLOOD (ROUTINE X 2)      Component Value Range   Specimen Description BLOOD RIGHT ARM     Special Requests       Value: BOTTLES DRAWN AEROBIC AND ANAEROBIC 8CC EACH BOTTLE   Culture PENDING     Report Status PENDING    PROCALCITONIN      Component Value Range   Procalcitonin 3.06    URINE MICROSCOPIC-ADD ON      Component Value Range   WBC, UA 0-2  <3 (WBC/hpf)   RBC / HPF 0-2  <3 (RBC/hpf)   Bacteria, UA RARE  RARE     Ct Abdomen Pelvis W Contrast  12/22/2011  *RADIOLOGY REPORT*  Clinical Data: Upper abdominal pain.  High fever.  CT ABDOMEN AND PELVIS WITH CONTRAST  Technique:  Multidetector CT imaging of the abdomen and pelvis was performed following the standard protocol during bolus administration of intravenous contrast.  Contrast: OMNIPAQUE IOHEXOL 300 MG/ML  SOLN  Comparison: CT of abdomen and pelvis 08/22/2010.  Findings:  Lung Bases: There is extensive air space consolidation throughout the visualized portions of both the right middle and right lower lobe, compatible with pneumonia.  Left lung base appears  relatively clear.  Abdomen/Pelvis:  There is a small amount of pneumobilia in the left hepatic bile ducts.  A small amount of periportal edema is noted throughout the liver.  The patient is status post cholecystectomy. The common bile duct is 8 mm within the porta hepatis (within normal limits in this postcholecystectomy patient).  Enhanced appearance of the pancreas, spleen, bilateral adrenal glands and left kidney is unremarkable.  There is a right-sided  extrarenal pelvis (normal anatomical variant) incidentally noted.  There is a Foley balloon catheter in place, and a small amount of gas in the nondependent portion of the urinary bladder, presumably iatrogenic.  The patient is status post hysterectomy.  Ovaries are not confidently identified and may be either atrophic or surgically absent.  There is no significant volume of ascites.  No pneumoperitoneum. Multiple borderline and mildly dilated loops of small bowel are noted, measuring up to approximately 3.1 cm in diameter.  However, there is gas and stool scattered throughout the colon extending to the distal rectum.  Numerous reactive size lymph nodes are noted throughout the retroperitoneum and the abdominal mesentery, but none of these meat CT criteria for pathologic enlargement.  Musculoskeletal: There appears to be a surgical mesh along the anterior surface of the sacrum, likely from prior rectopexy. There are no aggressive appearing lytic or blastic lesions noted in the visualized portions of the skeleton.  IMPRESSION: 1.  Extensive air space consolidation throughout the right middle and lower lobes, compatible with pneumonia.  This likely accounts for the patient's high fever. 2.  Small amount of pneumobilia which is new compared to the prior study.  However, this is presumably related to the patient's history of prior sphincterotomy performed in 2005.  3.  In addition, there is periportal edema which is nonspecific, but can be seen in the setting of acute  hepatitis.  Clinical correlation is recommended. 4.  Multiple borderline dilated and mildly dilated loops of small bowel.  This is highly nonspecific, and not favored to represent obstruction as there is extensive gas and stool noted throughout the colon.  However, clinical correlation is recommended. 5. Status post cholecystectomy, hysterectomy and rectopexy.  Original Report Authenticated By: Florencia Reasons, M.D.   Dg Chest Portable 1 View  12/22/2011  *RADIOLOGY REPORT*  Clinical Data: Fever.  Abdominal pain and chills.  PORTABLE CHEST - 1 VIEW  Comparison: Chest and rib films 05/28/2011.  Findings: The heart size is normal.  A new right lower lobe pneumonia is present.  Minimal airspace disease at the left base may represent atelectasis or pneumonia.  The visualized soft tissues and bony thorax are unremarkable.  IMPRESSION:  1.  Right lower lobe pneumonia. 2.  Minimal left basilar airspace disease may represent atelectasis.  Infection is not excluded.  Original Report Authenticated By: Jamesetta Orleans. MATTERN, M.D.     Date: 12/22/2011  Rate: 127  Rhythm: sinus tachycardia  QRS Axis: normal  Intervals: normal  ST/T Wave abnormalities: nonspecific ST/T changes  Conduction Disutrbances:none  Narrative Interpretation:   Old EKG Reviewed: none available    1. CAP (community acquired pneumonia)   2. Sepsis     CRITICAL CARE Performed by: Shelda Jakes.   Total critical care time: 60  Critical care time was exclusive of separately billable procedures and treating other patients.  Critical care was necessary to treat or prevent imminent or life-threatening deterioration.  Critical care was time spent personally by me on the following activities: development of treatment plan with patient and/or surrogate as well as nursing, discussions with consultants, evaluation of patient's response to treatment, examination of patient, obtaining history from patient or surrogate, ordering and  performing treatments and interventions, ordering and review of laboratory studies, ordering and review of radiographic studies, pulse oximetry and re-evaluation of patient's condition.   MDM  Patient presented with fever significant tachycardia tachypnea and hypotension with blood pressure 85/50. Patient presumed to be septic, septic protocol initiated 2 L  of IV fluids normal saline 1 in cultures obtained and patient given Zosyn antibiotic. Patient was mentating fine throughout this after 1 L of fluid blood pressure improved to the low 100 systolic. Tachycardia persisted. Patient continued to mentate fine. Chest x-ray was consistent with a right-sided pneumonia CT scan was done of the abdomen for diffuse abdominal pain that also verified right-sided pneumonia right middle lobe and right lower lobe. Patient has not been in the hospital recently so this would be a community-acquired pneumonia. Once pneumonia was identified the patient was given 1 g of Rocephin and IV Zithromax to cover the community acquired pneumonia protocol requirements. Patient's blood pressure remained good Lois a guide after the initial period of hypotension was a systolic of 96 patient's mentation remained good point blood pressure went down to 96 patient had another liter of IV normal saline. Hospitalist team was called for admission to ICU.  Of significance on her labs no evidence of urinary tract infection pro calcitonin was normal, Lactic acid was elevated, potassium was significantly decreased at 2.4 for this the patient received 40 mEq of potassium by mouth and 10 mEq of potassium IV piggyback x2.      I personally performed the services described in this documentation, which was scribed in my presence. The recorded information has been reviewed and considered.     Shelda Jakes, MD 12/22/11 [phone]

## 2011-12-22 NOTE — ED Notes (Signed)
CRITICAL VALUE ALERT  Critical value received: potassium 2.4 Date of notification:  12/22/2011 Time of notification: 11:37 Critical value read back: yes Nurse who received alert:  Juliette Alcide, RN  MD notified (1st page):  Dr. Deretha Emory   Time of first page:    MD notified (2nd page):  Time of second page:  Responding MD:  Dr. Deretha Emory   Time MD responded:

## 2011-12-22 NOTE — ED Notes (Signed)
Pt presents to er with c/o fever, abd pain, lower back pain, pt admits to cough, denies any sob,  pt states that she began to experience constant cramping type upper abd pain last night around 6 pm, states that she was able to go to bed, woke up this am with fever of 105.3 per home thermometer, admits to some problems with urination but pt states that they are no different than what she usually has with her interstitial cystitis. Dr. Deretha Emory at bedside upon pt arrival to tx room, pt also states that she did take two motrin tablets at home after waking this am,.

## 2011-12-22 NOTE — ED Notes (Signed)
Pt requesting something for pain, Dr. Deretha Emory notified,

## 2011-12-23 ENCOUNTER — Inpatient Hospital Stay (HOSPITAL_COMMUNITY): Payer: 59

## 2011-12-23 DIAGNOSIS — A413 Sepsis due to Hemophilus influenzae: Secondary | ICD-10-CM

## 2011-12-23 DIAGNOSIS — D696 Thrombocytopenia, unspecified: Secondary | ICD-10-CM

## 2011-12-23 LAB — COMPREHENSIVE METABOLIC PANEL
ALT: 109 U/L — ABNORMAL HIGH (ref 0–35)
BUN: 5 mg/dL — ABNORMAL LOW (ref 6–23)
CO2: 27 mEq/L (ref 19–32)
Calcium: 8.9 mg/dL (ref 8.4–10.5)
Creatinine, Ser: 0.53 mg/dL (ref 0.50–1.10)
GFR calc Af Amer: >90 mL/min (ref >90)
GFR calc non Af Amer: >90 mL/min (ref >90)
Glucose, Bld: 113 mg/dL — ABNORMAL HIGH (ref 70–99)

## 2011-12-23 LAB — URINE CULTURE: Culture  Setup Time: 201305181954

## 2011-12-23 LAB — CBC
Hemoglobin: 10.7 g/dL — ABNORMAL LOW (ref 12.0–15.0)
MCHC: 32.6 g/dL (ref 30.0–36.0)
WBC: 16.4 10*3/uL — ABNORMAL HIGH (ref 4.0–10.5)

## 2011-12-23 LAB — HIV ANTIBODY (ROUTINE TESTING W REFLEX): HIV: NONREACTIVE

## 2011-12-23 LAB — LEGIONELLA ANTIGEN, URINE

## 2011-12-23 MED ORDER — SODIUM CHLORIDE 0.9 % IJ SOLN
INTRAMUSCULAR | Status: AC
  Filled 2011-12-23: qty 3

## 2011-12-23 MED ORDER — ACETAMINOPHEN 325 MG PO TABS
650.0000 mg | ORAL_TABLET | Freq: Four times a day (QID) | ORAL | Status: DC | PRN
  Administered 2011-12-23: 650 mg via ORAL
  Filled 2011-12-23: qty 2

## 2011-12-23 MED ORDER — ONDANSETRON HCL 4 MG/2ML IJ SOLN
INTRAMUSCULAR | Status: AC
  Filled 2011-12-23: qty 2

## 2011-12-23 MED ORDER — ONDANSETRON HCL 4 MG/2ML IJ SOLN
4.0000 mg | Freq: Four times a day (QID) | INTRAMUSCULAR | Status: DC | PRN
  Administered 2011-12-23 (×3): 4 mg via INTRAVENOUS
  Filled 2011-12-23 (×2): qty 2

## 2011-12-23 MED ORDER — ACETAMINOPHEN 325 MG PO TABS
ORAL_TABLET | ORAL | Status: AC
  Administered 2011-12-23: 650 mg
  Filled 2011-12-23: qty 2

## 2011-12-23 NOTE — Progress Notes (Signed)
Pt requesting zofren for nausea and some graham crackers to eat. Explained to pt that normally patients are npo if they are nauseous. Told her she could have graham crackers but nothing for nausea if she could eat. 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 Again explained she could have one or the other. Pt decided she wanted zofren. No vomiting has been noted

## 2011-12-23 NOTE — Progress Notes (Signed)
Subjective: This lady was started on dopamine view of her low blood pressure. However, renal functions remained extremely good and she has been maintaining well. She denies being dyspneic particularly although the comments that she is slightly congested.           Physical Exam: Blood pressure 100/58, pulse 83, temperature 100.1 F (37.8 C), temperature source Core (Comment), resp. rate 18, height 5\' 4"  (1.626 m), weight 82.5 kg (181 lb 14.1 oz), SpO2 94.00%. She does look systemically well. Her peripheries are warm and she does not look clinically shock. Lung fields are still clear. She is alert and orientated.   Investigations:  Recent Results (from the past 240 hour(s))  CULTURE, BLOOD (ROUTINE X 2)     Status: Normal (Preliminary result)   Collection Time   12/22/11 10:36 AM      Component Value Range Status Comment   Specimen Description BLOOD LEFT FOREARM   Final    Special Requests     Final    Value: BOTTLES DRAWN AEROBIC AND ANAEROBIC 8CC EACH BOTTLE   Culture NO GROWTH <24 HRS   Final    Report Status PENDING   Incomplete   CULTURE, BLOOD (ROUTINE X 2)     Status: Normal (Preliminary result)   Collection Time   12/22/11 10:37 AM      Component Value Range Status Comment   Specimen Description BLOOD RIGHT ARM   Final    Special Requests     Final    Value: BOTTLES DRAWN AEROBIC AND ANAEROBIC 8CC EACH BOTTLE   Culture NO GROWTH <24 HRS   Final    Report Status PENDING   Incomplete   MRSA PCR SCREENING     Status: Normal   Collection Time   12/22/11  4:30 PM      Component Value Range Status Comment   MRSA by PCR NEGATIVE  NEGATIVE  Final      Basic Metabolic Panel:  Basename 12/23/11 0500 12/22/11 1845  NA 132* 134*  K 3.5 4.1  CL 97 103  CO2 27 25  GLUCOSE 113* 126*  BUN 5* 7  CREATININE 0.53 0.61  CALCIUM 8.9 7.7*  MG -- --  PHOS -- --   Liver Function Tests:  Basename 12/23/11 0500 12/22/11 1030  AST 78* 26  ALT 109* 20  ALKPHOS 121* 95    BILITOT 0.5 0.3  PROT 6.9 6.5  ALBUMIN 3.4* 3.5     CBC:  Basename 12/23/11 0500 12/22/11 1030  WBC 16.4* 8.9  NEUTROABS -- --  HGB 10.7* 11.6*  HCT 32.8* 33.7*  MCV 92.4 88.7  PLT 184 192    Ct Abdomen Pelvis W Contrast  12/22/2011  *RADIOLOGY REPORT*  Clinical Data: Upper abdominal pain.  High fever.  CT ABDOMEN AND PELVIS WITH CONTRAST  Technique:  Multidetector CT imaging of the abdomen and pelvis was performed following the standard protocol during bolus administration of intravenous contrast.  Contrast: OMNIPAQUE IOHEXOL 300 MG/ML  SOLN  Comparison: CT of abdomen and pelvis 08/22/2010.  Findings:  Lung Bases: There is extensive air space consolidation throughout the visualized portions of both the right middle and right lower lobe, compatible with pneumonia.  Left lung base appears relatively clear.  Abdomen/Pelvis:  There is a small amount of pneumobilia in the left hepatic bile ducts.  A small amount of periportal edema is noted throughout the liver.  The patient is status post cholecystectomy. The common bile duct is 8 mm within  the porta hepatis (within normal limits in this postcholecystectomy patient).  Enhanced appearance of the pancreas, spleen, bilateral adrenal glands and left kidney is unremarkable.  There is a right-sided extrarenal pelvis (normal anatomical variant) incidentally noted.  There is a Foley balloon catheter in place, and a small amount of gas in the nondependent portion of the urinary bladder, presumably iatrogenic.  The patient is status post hysterectomy.  Ovaries are not confidently identified and may be either atrophic or surgically absent.  There is no significant volume of ascites.  No pneumoperitoneum. Multiple borderline and mildly dilated loops of small bowel are noted, measuring up to approximately 3.1 cm in diameter.  However, there is gas and stool scattered throughout the colon extending to the distal rectum.  Numerous reactive size lymph nodes  are noted throughout the retroperitoneum and the abdominal mesentery, but none of these meat CT criteria for pathologic enlargement.  Musculoskeletal: There appears to be a surgical mesh along the anterior surface of the sacrum, likely from prior rectopexy. There are no aggressive appearing lytic or blastic lesions noted in the visualized portions of the skeleton.  IMPRESSION: 1.  Extensive air space consolidation throughout the right middle and lower lobes, compatible with pneumonia.  This likely accounts for the patient's high fever. 2.  Small amount of pneumobilia which is new compared to the prior study.  However, this is presumably related to the patient's history of prior sphincterotomy performed in 2005.  3.  In addition, there is periportal edema which is nonspecific, but can be seen in the setting of acute hepatitis.  Clinical correlation is recommended. 4.  Multiple borderline dilated and mildly dilated loops of small bowel.  This is highly nonspecific, and not favored to represent obstruction as there is extensive gas and stool noted throughout the colon.  However, clinical correlation is recommended. 5. Status post cholecystectomy, hysterectomy and rectopexy.  Original Report Authenticated By: Florencia Reasons, M.D.   Dg Chest Portable 1 View  12/22/2011  *RADIOLOGY REPORT*  Clinical Data: Fever.  Abdominal pain and chills.  PORTABLE CHEST - 1 VIEW  Comparison: Chest and rib films 05/28/2011.  Findings: The heart size is normal.  A new right lower lobe pneumonia is present.  Minimal airspace disease at the left base may represent atelectasis or pneumonia.  The visualized soft tissues and bony thorax are unremarkable.  IMPRESSION:  1.  Right lower lobe pneumonia. 2.  Minimal left basilar airspace disease may represent atelectasis.  Infection is not excluded.  Original Report Authenticated By: Jamesetta Orleans. MATTERN, M.D.      Medications: I have reviewed the patient's current  medications.  Impression: 1. Community-acquired pneumonia with sepsis syndrome. 2. Fibromyalgia. 3. History of recurrent fevers. 4. Elevated liver enzymes, unclear etiology, possibly secondary to hypotension.     Plan: 1. Try to wean off dopamine and discontinue as I think she is doing well and renal function is normal. 2. Continue with antibiotics. 3. Discontinue Foley catheter is this is irritating her at the present time and her renal function is good and her urine output can still be monitored adequately.     LOS: 1 day   Wilson Singer Pager [phone]  12/23/2011, 7:44 AM

## 2011-12-24 DIAGNOSIS — D696 Thrombocytopenia, unspecified: Secondary | ICD-10-CM

## 2011-12-24 DIAGNOSIS — A413 Sepsis due to Hemophilus influenzae: Secondary | ICD-10-CM

## 2011-12-24 LAB — COMPREHENSIVE METABOLIC PANEL
ALT: 64 U/L — ABNORMAL HIGH (ref 0–35)
Alkaline Phosphatase: 99 U/L (ref 39–117)
CO2: 27 mEq/L (ref 19–32)
GFR calc Af Amer: >90 mL/min (ref >90)
GFR calc non Af Amer: >90 mL/min (ref >90)
Glucose, Bld: 91 mg/dL (ref 70–99)
Potassium: 3.3 mEq/L — ABNORMAL LOW (ref 3.5–5.1)
Sodium: 131 mEq/L — ABNORMAL LOW (ref 135–145)
Total Bilirubin: 0.4 mg/dL (ref 0.3–1.2)

## 2011-12-24 LAB — CBC
Hemoglobin: 10.4 g/dL — ABNORMAL LOW (ref 12.0–15.0)
MCH: 30.6 pg (ref 26.0–34.0)
RBC: 3.4 MIL/uL — ABNORMAL LOW (ref 3.87–5.11)

## 2011-12-24 MED ORDER — LEVOFLOXACIN 750 MG PO TABS: 750.0000 mg | ORAL_TABLET | Freq: Every day | ORAL | Status: AC

## 2011-12-24 MED ORDER — POTASSIUM CHLORIDE CRYS ER 20 MEQ PO TBCR
40.0000 meq | EXTENDED_RELEASE_TABLET | Freq: Once | ORAL | Status: AC
  Administered 2011-12-24: 40 meq via ORAL
  Filled 2011-12-24: qty 2

## 2011-12-24 NOTE — Progress Notes (Signed)
UR Chart Review Completed  

## 2011-12-24 NOTE — Progress Notes (Signed)
Pt dischagre instructions given.rx for Levaquin given. Pt happy to be going home. Afebrile at this time.

## 2011-12-24 NOTE — Discharge Summary (Signed)
Physician Discharge Summary  Patient ID: Alexandra Reid MRN: [id number] DOB/AGE: [birth year] 46 y.o. Primary Care Physician:MCGOWEN,PHILIP H, MD, MD Admit date: 12/22/2011 Discharge date: 12/24/2011    Discharge Diagnoses:  1. Community-acquired right middle and lower lobe pneumonia, clinically improved. 2. Fibromyalgia. 3. Recurrent fevers, unclear etiology.   Medication List  As of 12/24/2011  7:34 AM   STOP taking these medications         vitamin C 500 MG tablet         TAKE these medications         ADVAIR DISKUS 250-50 MCG/DOSE Aepb   Generic drug: Fluticasone-Salmeterol   Inhale 1 puff into the lungs every 12 (twelve) hours.      amitriptyline 100 MG tablet   Commonly known as: ELAVIL   Take 50 mg by mouth at bedtime.      amLODipine 5 MG tablet   Commonly known as: NORVASC   Take 5 mg by mouth daily.      CALCIUM 500 PO   Take 1 tablet by mouth daily.      carisoprodol 250 MG tablet   Commonly known as: SOMA   Take 250 mg by mouth as needed. As needed for pain      clonazePAM 1 MG tablet   Commonly known as: KLONOPIN   Take 1 mg by mouth 3 (three) times daily as needed. As needed for anxiety, takes one every night for sleep      esomeprazole 40 MG capsule   Commonly known as: NEXIUM   Take 40 mg by mouth 2 (two) times daily. 1 tab po bid      Fish Oil Oil   Take 2 capsules by mouth daily.      furosemide 20 MG tablet   Commonly known as: LASIX   Take 20 mg by mouth daily as needed. For swelling      hyoscyamine 0.125 MG SL tablet   Commonly known as: LEVSIN SL   Take 1-2 tablets (0.125-0.25 mg total) by mouth every 6 (six) hours as needed for cramping.      levofloxacin 750 MG tablet   Commonly known as: LEVAQUIN   Take 1 tablet (750 mg total) by mouth daily.      MAGNESIUM PO   Take 1 tablet by mouth daily. Unknown strength      meloxicam 7.5 MG tablet   Commonly known as: MOBIC   Take 7.5 mg by mouth daily.      ondansetron 8 MG  disintegrating tablet   Commonly known as: ZOFRAN-ODT   Take 1 tablet (8 mg total) by mouth every 8 (eight) hours as needed. For nausea      oxyCODONE 5 MG immediate release tablet   Commonly known as: Oxy IR/ROXICODONE   Take 5-10 mg by mouth every 4 (four) hours as needed. 1-2 tabs po q 4 hours prn breakthrough pain      Oxycodone HCl 60 MG Tb12   Take 1 tablet by mouth 2 (two) times daily. Pain contract in chart      phenazopyridine 200 MG tablet   Commonly known as: PYRIDIUM      polyethylene glycol powder powder   Commonly known as: GLYCOLAX/MIRALAX   Take 17 g by mouth daily as needed. for constipation      pramipexole 0.125 MG tablet   Commonly known as: MIRAPEX   Take 0.125-0.5 mg by mouth at bedtime. 1-4 tabs po qhs      PROAIR  HFA 108 (90 BASE) MCG/ACT inhaler   Generic drug: albuterol   Inhale 2 puffs into the lungs every 4 (four) hours as needed. Shortness of breath      SUMAtriptan 50 MG tablet   Commonly known as: IMITREX   Take 50 mg by mouth every 2 (two) hours as needed. 1-2 tabs po q2h prn migraine HA, max in 24h is 300 mg      zolpidem 10 MG tablet   Commonly known as: AMBIEN   Take 10 mg by mouth at bedtime.            Discharged Condition: Stable and improved.    Consults: None.  Significant Diagnostic Studies: Ct Abdomen Pelvis W Contrast  12/22/2011  *RADIOLOGY REPORT*  Clinical Data: Upper abdominal pain.  High fever.  CT ABDOMEN AND PELVIS WITH CONTRAST  Technique:  Multidetector CT imaging of the abdomen and pelvis was performed following the standard protocol during bolus administration of intravenous contrast.  Contrast: OMNIPAQUE IOHEXOL 300 MG/ML  SOLN  Comparison: CT of abdomen and pelvis 08/22/2010.  Findings:  Lung Bases: There is extensive air space consolidation throughout the visualized portions of both the right middle and right lower lobe, compatible with pneumonia.  Left lung base appears relatively clear.  Abdomen/Pelvis:   There is a small amount of pneumobilia in the left hepatic bile ducts.  A small amount of periportal edema is noted throughout the liver.  The patient is status post cholecystectomy. The common bile duct is 8 mm within the porta hepatis (within normal limits in this postcholecystectomy patient).  Enhanced appearance of the pancreas, spleen, bilateral adrenal glands and left kidney is unremarkable.  There is a right-sided extrarenal pelvis (normal anatomical variant) incidentally noted.  There is a Foley balloon catheter in place, and a small amount of gas in the nondependent portion of the urinary bladder, presumably iatrogenic.  The patient is status post hysterectomy.  Ovaries are not confidently identified and may be either atrophic or surgically absent.  There is no significant volume of ascites.  No pneumoperitoneum. Multiple borderline and mildly dilated loops of small bowel are noted, measuring up to approximately 3.1 cm in diameter.  However, there is gas and stool scattered throughout the colon extending to the distal rectum.  Numerous reactive size lymph nodes are noted throughout the retroperitoneum and the abdominal mesentery, but none of these meat CT criteria for pathologic enlargement.  Musculoskeletal: There appears to be a surgical mesh along the anterior surface of the sacrum, likely from prior rectopexy. There are no aggressive appearing lytic or blastic lesions noted in the visualized portions of the skeleton.  IMPRESSION: 1.  Extensive air space consolidation throughout the right middle and lower lobes, compatible with pneumonia.  This likely accounts for the patient's high fever. 2.  Small amount of pneumobilia which is new compared to the prior study.  However, this is presumably related to the patient's history of prior sphincterotomy performed in 2005.  3.  In addition, there is periportal edema which is nonspecific, but can be seen in the setting of acute hepatitis.  Clinical correlation is  recommended. 4.  Multiple borderline dilated and mildly dilated loops of small bowel.  This is highly nonspecific, and not favored to represent obstruction as there is extensive gas and stool noted throughout the colon.  However, clinical correlation is recommended. 5. Status post cholecystectomy, hysterectomy and rectopexy.  Original Report Authenticated By: Florencia Reasons, M.D.   Dg Chest Port 1  View  12/23/2011  *RADIOLOGY REPORT*  Clinical Data: Pneumonia  PORTABLE CHEST - 1 VIEW  Comparison: 12/22/2011  Findings: Right lower lobe infiltrate shows partial clearing. Decreased lung volume bilaterally.  No pleural effusion is present. Negative for heart failure.  IMPRESSION: Improving right lower lobe pneumonia.  Hypoventilation.  Original Report Authenticated By: Camelia Phenes, M.D.   Dg Chest Portable 1 View  12/22/2011  *RADIOLOGY REPORT*  Clinical Data: Fever.  Abdominal pain and chills.  PORTABLE CHEST - 1 VIEW  Comparison: Chest and rib films 05/28/2011.  Findings: The heart size is normal.  A new right lower lobe pneumonia is present.  Minimal airspace disease at the left base may represent atelectasis or pneumonia.  The visualized soft tissues and bony thorax are unremarkable.  IMPRESSION:  1.  Right lower lobe pneumonia. 2.  Minimal left basilar airspace disease may represent atelectasis.  Infection is not excluded.  Original Report Authenticated By: Jamesetta Orleans. MATTERN, M.D.    Lab Results: Basic Metabolic Panel:  Basename 12/24/11 0433 12/23/11 0500  NA 131* 132*  K 3.3* 3.5  CL 94* 97  CO2 27 27  GLUCOSE 91 113*  BUN 6 5*  CREATININE 0.64 0.53  CALCIUM 8.8 8.9  MG -- --  PHOS -- --   Liver Function Tests:  Basename 12/24/11 0433 12/23/11 0500  AST 28 78*  ALT 64* 109*  ALKPHOS 99 121*  BILITOT 0.4 0.5  PROT 6.5 6.9  ALBUMIN 3.1* 3.4*     CBC:  Basename 12/24/11 0433 12/23/11 0500  WBC 11.1* 16.4*  NEUTROABS -- --  HGB 10.4* 10.7*  HCT 30.8* 32.8*  MCV  90.6 92.4  PLT 180 184    Recent Results (from the past 240 hour(s))  CULTURE, BLOOD (ROUTINE X 2)     Status: Normal (Preliminary result)   Collection Time   12/22/11 10:36 AM      Component Value Range Status Comment   Specimen Description BLOOD LEFT FOREARM   Final    Special Requests     Final    Value: BOTTLES DRAWN AEROBIC AND ANAEROBIC 8CC EACH BOTTLE   Culture NO GROWTH 1 DAY   Final    Report Status PENDING   Incomplete   CULTURE, BLOOD (ROUTINE X 2)     Status: Normal (Preliminary result)   Collection Time   12/22/11 10:37 AM      Component Value Range Status Comment   Specimen Description BLOOD RIGHT ARM   Final    Special Requests     Final    Value: BOTTLES DRAWN AEROBIC AND ANAEROBIC 8CC EACH BOTTLE   Culture NO GROWTH 1 DAY   Final    Report Status PENDING   Incomplete   URINE CULTURE     Status: Normal   Collection Time   12/22/11 10:48 AM      Component Value Range Status Comment   Specimen Description URINE, CATHETERIZED   Final    Special Requests NONE   Final    Culture  Setup Time [id number]   Final    Colony Count NO GROWTH   Final    Culture NO GROWTH   Final    Report Status 12/23/2011 FINAL   Final   MRSA PCR SCREENING     Status: Normal   Collection Time   12/22/11  4:30 PM      Component Value Range Status Comment   MRSA by PCR NEGATIVE  NEGATIVE  Final  Hospital Course: This 46 year old lady presented to the hospital with symptoms of fever, dry cough and dyspnea. The symptoms have been present for the last 12-18 hours prior to hospitalization. Interestingly, she has had fevers reaching back to October 2012 and has been investigated apparently in extensively by the infectious disease department at [facility]. On this occasion she clearly had a etiology of her fever in that she had a right middle and lower lobe pneumonia. She was treated appropriately with intravenous ceftriaxone and intravenous Zithromax. She improved, although initially  she was hypotensive and did require dopamine for a very short period of time. Today she feels extremely well, she is not requiring any supplemental oxygen, she does not have a resting tachycardia and does not have increased work of breathing. She is keen to go home. Her chest x-ray also has improved.  Discharge Exam: Blood pressure 114/70, pulse 95, temperature 98 F (36.7 C), temperature source Oral, resp. rate 24, height 5\' 4"  (1.626 m), weight 83.2 kg (183 lb 6.8 oz), SpO2 95.00%. She looks systemically well. She is not toxic or septic. There is no increased work of breathing. There is no peripheral central cyanosis. Lung fields are entirely clear. Heart sounds are present and normal without murmurs. She is alert and orientated without any focal neurological signs.  Disposition: Home. I think in view of the extensive pneumonia on chest x-ray and her initial presentation, she would warrant another 10 days of oral antibiotics. I'm giving her Levaquin. However, I've told her that the overall picture is really not clear. The etiology of her fevers are not clear. I would favor her being investigated further by the infectious disease department.  Discharge Orders    Future Appointments: Provider: Department: Dept Phone: Center:   12/28/2011 1:45 PM Jeoffrey Massed, MD [facility] [phone] None     Future Orders Please Complete By Expires   Diet - low sodium heart healthy      Increase activity slowly           Signed: Wilson Singer Pager [phone]  12/24/2011, 7:34 AM

## 2011-12-26 ENCOUNTER — Encounter: Payer: Self-pay | Admitting: Family Medicine

## 2011-12-27 ENCOUNTER — Other Ambulatory Visit: Payer: Self-pay | Admitting: Family Medicine

## 2011-12-27 LAB — CULTURE, BLOOD (ROUTINE X 2)
Culture: NO GROWTH
Culture: NO GROWTH

## 2011-12-28 ENCOUNTER — Ambulatory Visit (INDEPENDENT_AMBULATORY_CARE_PROVIDER_SITE_OTHER): Payer: 59 | Admitting: Family Medicine

## 2011-12-28 ENCOUNTER — Encounter: Payer: Self-pay | Admitting: Family Medicine

## 2011-12-28 ENCOUNTER — Ambulatory Visit: Payer: 59 | Admitting: Family Medicine

## 2011-12-28 VITALS — BP 100/74 | HR 124 | Temp 99.2°F | Ht 64.5 in | Wt 159.0 lb

## 2011-12-28 DIAGNOSIS — IMO0001 Reserved for inherently not codable concepts without codable children: Secondary | ICD-10-CM

## 2011-12-28 DIAGNOSIS — E860 Dehydration: Secondary | ICD-10-CM | POA: Insufficient documentation

## 2011-12-28 DIAGNOSIS — J189 Pneumonia, unspecified organism: Secondary | ICD-10-CM | POA: Insufficient documentation

## 2011-12-28 LAB — BASIC METABOLIC PANEL
Calcium: 9.3 mg/dL (ref 8.4–10.5)
Creat: 0.75 mg/dL (ref 0.50–1.10)
Glucose, Bld: 88 mg/dL (ref 70–99)
Sodium: 129 mEq/L — ABNORMAL LOW (ref 135–145)

## 2011-12-28 LAB — CBC WITH DIFFERENTIAL/PLATELET
Basophils Relative: 0 % (ref 0–1)
Eosinophils Absolute: 0.3 10*3/uL (ref 0.0–0.7)
MCH: 29 pg (ref 26.0–34.0)
MCHC: 34.5 g/dL (ref 30.0–36.0)
Neutrophils Relative %: 83 % — ABNORMAL HIGH (ref 43–77)
Platelets: 304 10*3/uL (ref 150–400)

## 2011-12-28 MED ORDER — OXYCODONE HCL 5 MG PO TABS: 5.0000 mg | ORAL_TABLET | ORAL | Status: DC | PRN

## 2011-12-28 MED ORDER — OXYCODONE HCL 60 MG PO TB12: 1.0000 | ORAL_TABLET | Freq: Two times a day (BID) | ORAL | Status: DC

## 2011-12-28 MED ORDER — PRAMIPEXOLE DIHYDROCHLORIDE 0.125 MG PO TABS: 0.1250 mg | ORAL_TABLET | Freq: Every day | ORAL | Status: DC

## 2011-12-28 MED ORDER — FLUCONAZOLE 150 MG PO TABS: ORAL_TABLET | ORAL | Status: DC

## 2011-12-28 MED ORDER — MAGIC MOUTHWASH: ORAL | Status: DC

## 2011-12-28 MED ORDER — CARISOPRODOL 250 MG PO TABS: ORAL_TABLET | ORAL | Status: DC

## 2011-12-28 MED ORDER — PHENAZOPYRIDINE HCL 200 MG PO TABS: 200.0000 mg | ORAL_TABLET | Freq: Three times a day (TID) | ORAL | Status: DC | PRN

## 2011-12-28 NOTE — Assessment & Plan Note (Signed)
Encouraged aggressive fluid intake PO. Will check BMET today stat.

## 2011-12-28 NOTE — Progress Notes (Signed)
OFFICE VISIT  12/28/2011   CC:  Chief Complaint  Patient presents with  . Follow-up    chronic issues; hosp Sat-Tues with pneumonia, continues with 10 day course of Levaquin     HPI:    Patient is a 46 y.o. Caucasian female who presents for 1 mo f/u chronic pain/fibromyalgia, also s/p hospitalization 5/18-5/20, 2013 for multilobar pneumonia w/sepsis syndrome.  All blood cultures ended up neg at 5d.  I reviewed her entire hospital record and d/c summary from this hospitalization today. She was d/c'd home on levaquin after she improved quite a bit--was no longer hypoxic and tachycardic and no longer hypotensive or febrile.  She apparently had not ambulated much prior to going home b/c once she got home she says she regretted getting d/c b/c she had periods of extreme fatigue, dizziness with standing up, turning pale, getting T 102+, sweating, and DOE.  Fever resolved 2d after d/c home.  She is still very fatigued and has DOE but no SOB at rest and minimal coughing.  No chest pain, but has some mid abd pain that she has been having ever since the beginning of her pneumonia illness, presumably from her basilar lobar pneumonia affecting her diaphram--her CT abd was otherwise normal in the hospital. No vomiting or nausea, but says eating is difficult for last several days due to burning sensation all throughout her mouth/toungue.  Drinking various liquids fine.   She had constipation in hosp, then took miralax when she got home, now having a bit of loose stool. No rash.  No change in her chronic musculoskeletal pain, needs RFs today--oxycodone, oxycontin, soma, mirapex, miralax, phenazopyridine.  Past Medical History  Diagnosis Date  . DDD (degenerative disc disease), lumbar   . Atypical chest pain     Question of coronary vasospasm; cath 10/20/10 NORMAL  . Anxiety   . Fibromyalgia     Dr. Hope Pigeon [phone]  . Interstitial cystitis   . Obesity   . Insomnia   . Transaminasemia 03/2010   (ALT 49), normal on f/u testing off of daily tylenol uuse.  Marland Kitchen GERD (gastroesophageal reflux disease) 06/01/10    Dyspepsia/gastritis, EGD 05/2010: gastritis (NSAIDS)  . Rectal bleeding 06/01/10    Anorectal bleeding/BRBPR--colonoscopy: internal hemorrhoids  . Recurrent UTI     renal u/s normal 07/2007  . Weight loss     CT abd/pelf 08/22/10 remakable only for bile duct 9mm (unchanged from 03/2004 PRIOR to ERCP w/spincterotomy, bile duct balloon dredging and lap chole)  . Recurrent fever     W/u unrevealing, including ID consult.    . Asthma   . Osteopenia     bone densitometry 2006 per pt  . 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 Christella Hartigan  . Multinodular goiter 11/2010    TSH normal  . Chest pain   . Rectal prolapse 02/2011    repaired lap [id number]  . Vitamin d deficiency 08/20/2011  . History of migraine headaches     Dx'd around 2009  . Edema 10/31/2011  . Lobar pneumonia mid May, 2013    RML and RLL, with sepsis--admission to [facility] 12/2011--responded extremely well to rocephin + zithromax.  . IBS (irritable bowel syndrome)   . De Quervain's tenosynovitis, left 11/11/2011    Past Surgical History  Procedure Date  . Bunionectomy     right foot  . Abdominal hysterectomy   . Bilateral salpingoophorectomy 2003    endometriosis  . Cholecystectomy 2005    lap--(cholelithiasis w/choledocholithiasis  on u/s 2005--PreOperative ERCP showed NO stone or other obstruction in the biliary tract, but CBD messured 9mm near head of pancreas.  S sphincterotomy and bile duct balloon dredging was done at that time.  MRCP 10/2010 NORMAL.  Marland Kitchen Orif metatarsal fracture 2006    Right 2nd and 3rd metatarsals (Dr. Romeo Apple)  . Biopsy thyroid 11/2010    Fine needle: NONmalignant goiter  . Rectal prolapse repair, rectopexy 05July2012    with sigmoid colectomy    Outpatient Prescriptions Prior to Visit  Medication Sig Dispense Refill  . albuterol (PROAIR HFA) 108 (90 BASE) MCG/ACT  inhaler Inhale 2 puffs into the lungs every 4 (four) hours as needed. Shortness of breath      . amitriptyline (ELAVIL) 100 MG tablet Take 50 mg by mouth at bedtime.      Marland Kitchen amLODipine (NORVASC) 5 MG tablet Take 5 mg by mouth daily.      . Calcium Carbonate (CALCIUM 500 PO) Take 1 tablet by mouth daily.       . clonazePAM (KLONOPIN) 1 MG tablet Take 1 mg by mouth 3 (three) times daily as needed. As needed for anxiety, takes one every night for sleep      . esomeprazole (NEXIUM) 40 MG capsule Take 40 mg by mouth 2 (two) times daily. 1 tab po bid      . Fish Oil OIL Take 2 capsules by mouth daily.       . Fluticasone-Salmeterol (ADVAIR DISKUS) 250-50 MCG/DOSE AEPB Inhale 1 puff into the lungs every 12 (twelve) hours.       . hyoscyamine (LEVSIN SL) 0.125 MG SL tablet Take 1-2 tablets (0.125-0.25 mg total) by mouth every 6 (six) hours as needed for cramping.  90 tablet  3  . levofloxacin (LEVAQUIN) 750 MG tablet Take 1 tablet (750 mg total) by mouth daily.  10 tablet  0  . MAGNESIUM PO Take 1 tablet by mouth daily. Unknown strength      . ondansetron (ZOFRAN-ODT) 8 MG disintegrating tablet Take 1 tablet (8 mg total) by mouth every 8 (eight) hours as needed. For nausea  20 tablet  3  . polyethylene glycol powder (GLYCOLAX/MIRALAX) powder Take 17 g by mouth daily as needed. for constipation      . SUMAtriptan (IMITREX) 50 MG tablet Take 50 mg by mouth every 2 (two) hours as needed. 1-2 tabs po q2h prn migraine HA, max in 24h is 300 mg      . zolpidem (AMBIEN) 10 MG tablet Take 10 mg by mouth at bedtime.      . carisoprodol (SOMA) 250 MG tablet Take 250 mg by mouth as needed. As needed for pain      . oxyCODONE (OXY IR/ROXICODONE) 5 MG immediate release tablet Take 5-10 mg by mouth every 4 (four) hours as needed. 1-2 tabs po q 4 hours prn breakthrough pain      . Oxycodone HCl 60 MG TB12 Take 1 tablet by mouth 2 (two) times daily. Pain contract in chart      . phenazopyridine (PYRIDIUM) 200 MG tablet        . pramipexole (MIRAPEX) 0.125 MG tablet Take 0.125-0.5 mg by mouth at bedtime. 1-4 tabs po qhs      . furosemide (LASIX) 20 MG tablet Take 20 mg by mouth daily as needed. For swelling      . meloxicam (MOBIC) 7.5 MG tablet Take 7.5 mg by mouth daily.        No Known Allergies  ROS As per HPI  PE: Blood pressure 100/74, pulse 124, temperature 99.2 F (37.3 C), temperature source Temporal, height 5' 4.5" (1.638 m), weight 159 lb (72.122 kg), SpO2 93.00%.  Orthostatics: supine 112/66, P 89, upright 104/80, P 101, standing 100/74, P 124. Gen: alert, tired appearing but NAD.  Displays lucid thought and speech.  Moves slowly. ENT: Ears: EACs clear, normal epithelium.  TMs with good light reflex and landmarks bilaterally.  Eyes: no injection, icteris, swelling, or exudate.  EOMI, PERRLA. Nose: no drainage or turbinate edema/swelling.  No injection or focal lesion.  Mouth: lips without lesion/swelling.  Oral mucosa pink and moist.  Dentition intact and without obvious caries or gingival swelling.  Oropharynx without erythema, exudate, or swelling.  Mucosa is moist. Neck - No masses or thyromegaly or limitation in range of motion CV: RRR, no m/r/g.   LUNGS: CTA bilat, nonlabored resps, good aeration in all lung fields. EXT: no clubbing, cyanosis, or edema.   LABS:  None today  IMPRESSION AND PLAN:  Pneumonia Improving from respiratory standpoint.  Finish levaquin (she was given a 10d course upon d/c from hospital). Will check stat CBC with BMET today given her ongoing nonspecific sx's and dehydration.  Dehydration Encouraged aggressive fluid intake PO. Will check BMET today stat.  FIBROMYALGIA, SEVERE Problem stable.  Continue current medications and diet appropriate for this condition.  We have reviewed our general long term plan for this problem and also reviewed symptoms and signs that should prompt the patient to call or return to the office. Refilled oxycontin, oxycodon, and soma  today.     FOLLOW UP: Return in about 5 days (around 01/02/2012) for f/u pneumonia.

## 2011-12-28 NOTE — Telephone Encounter (Signed)
RX sent.  Pt follow up on 12/27/05.

## 2011-12-28 NOTE — Assessment & Plan Note (Signed)
Improving from respiratory standpoint.  Finish levaquin (she was given a 10d course upon d/c from hospital). Will check stat CBC with BMET today given her ongoing nonspecific sx's and dehydration.

## 2011-12-28 NOTE — Assessment & Plan Note (Signed)
Problem stable.  Continue current medications and diet appropriate for this condition.  We have reviewed our general long term plan for this problem and also reviewed symptoms and signs that should prompt the patient to call or return to the office. Refilled oxycontin, oxycodon, and soma today.

## 2012-01-02 ENCOUNTER — Ambulatory Visit: Payer: 59 | Admitting: Family Medicine

## 2012-01-04 ENCOUNTER — Ambulatory Visit: Payer: 59 | Admitting: Family Medicine

## 2012-01-07 ENCOUNTER — Ambulatory Visit: Payer: 59 | Admitting: Family Medicine

## 2012-01-25 ENCOUNTER — Ambulatory Visit (INDEPENDENT_AMBULATORY_CARE_PROVIDER_SITE_OTHER): Payer: 59 | Admitting: Family Medicine

## 2012-01-25 ENCOUNTER — Encounter: Payer: Self-pay | Admitting: Family Medicine

## 2012-01-25 VITALS — BP 106/73 | HR 116 | Ht 64.5 in | Wt 152.0 lb

## 2012-01-25 DIAGNOSIS — IMO0001 Reserved for inherently not codable concepts without codable children: Secondary | ICD-10-CM

## 2012-01-25 DIAGNOSIS — E042 Nontoxic multinodular goiter: Secondary | ICD-10-CM

## 2012-01-25 DIAGNOSIS — R51 Headache: Secondary | ICD-10-CM

## 2012-01-25 DIAGNOSIS — G43009 Migraine without aura, not intractable, without status migrainosus: Secondary | ICD-10-CM

## 2012-01-25 DIAGNOSIS — H04129 Dry eye syndrome of unspecified lacrimal gland: Secondary | ICD-10-CM

## 2012-01-25 DIAGNOSIS — M797 Fibromyalgia: Secondary | ICD-10-CM

## 2012-01-25 DIAGNOSIS — A689 Relapsing fever, unspecified: Secondary | ICD-10-CM

## 2012-01-25 LAB — CBC WITH DIFFERENTIAL/PLATELET
Basophils Absolute: 0 10*3/uL (ref 0.0–0.1)
Eosinophils Absolute: 0.1 10*3/uL (ref 0.0–0.7)
Lymphocytes Relative: 12.3 % (ref 12.0–46.0)
MCHC: 33 g/dL (ref 30.0–36.0)
MCV: 92.7 fl (ref 78.0–100.0)
Monocytes Absolute: 0.3 10*3/uL (ref 0.1–1.0)
Neutrophils Relative %: 80.8 % — ABNORMAL HIGH (ref 43.0–77.0)
Platelets: 231 10*3/uL (ref 150.0–400.0)

## 2012-01-25 LAB — COMPREHENSIVE METABOLIC PANEL
ALT: 15 U/L (ref 0–35)
AST: 19 U/L (ref 0–37)
Alkaline Phosphatase: 88 U/L (ref 39–117)
Potassium: 4.2 mEq/L (ref 3.5–5.1)
Sodium: 134 mEq/L — ABNORMAL LOW (ref 135–145)
Total Bilirubin: 0.4 mg/dL (ref 0.3–1.2)
Total Protein: 7.6 g/dL (ref 6.0–8.3)

## 2012-01-25 LAB — SEDIMENTATION RATE: Sed Rate: 14 mm/hr (ref 0–22)

## 2012-01-25 LAB — TSH: TSH: 0.45 u[IU]/mL (ref 0.35–5.50)

## 2012-01-25 MED ORDER — ALBUTEROL SULFATE HFA 108 (90 BASE) MCG/ACT IN AERS: 2.0000 | INHALATION_SPRAY | RESPIRATORY_TRACT | Status: DC | PRN

## 2012-01-25 MED ORDER — PRAMIPEXOLE DIHYDROCHLORIDE 0.125 MG PO TABS: ORAL_TABLET | ORAL | Status: DC

## 2012-01-25 MED ORDER — MELOXICAM 7.5 MG PO TABS: 7.5000 mg | ORAL_TABLET | Freq: Every day | ORAL | Status: AC

## 2012-01-25 MED ORDER — SUCRALFATE 1 G PO TABS: ORAL_TABLET | ORAL | Status: DC

## 2012-01-25 MED ORDER — OXYCODONE HCL 60 MG PO TB12: 1.0000 | ORAL_TABLET | Freq: Two times a day (BID) | ORAL | Status: DC

## 2012-01-25 MED ORDER — PHENAZOPYRIDINE HCL 200 MG PO TABS: 200.0000 mg | ORAL_TABLET | Freq: Three times a day (TID) | ORAL | Status: DC | PRN

## 2012-01-25 MED ORDER — SUMATRIPTAN SUCCINATE 50 MG PO TABS: 100.0000 mg | ORAL_TABLET | ORAL | Status: DC | PRN

## 2012-01-25 MED ORDER — AMLODIPINE BESYLATE 5 MG PO TABS: 5.0000 mg | ORAL_TABLET | Freq: Every day | ORAL | Status: AC

## 2012-01-25 MED ORDER — ESOMEPRAZOLE MAGNESIUM 40 MG PO CPDR: 40.0000 mg | DELAYED_RELEASE_CAPSULE | Freq: Two times a day (BID) | ORAL | Status: DC

## 2012-01-25 MED ORDER — HYOSCYAMINE SULFATE 0.125 MG SL SUBL: 0.1250 mg | SUBLINGUAL_TABLET | Freq: Four times a day (QID) | SUBLINGUAL | Status: DC | PRN

## 2012-01-25 MED ORDER — OXYCODONE HCL 5 MG PO TABS: 5.0000 mg | ORAL_TABLET | ORAL | Status: DC | PRN

## 2012-01-25 MED ORDER — POLYETHYLENE GLYCOL 3350 17 GM/SCOOP PO POWD: ORAL | Status: AC

## 2012-01-25 NOTE — Progress Notes (Signed)
OFFICE VISIT  01/27/2012   CC:  Chief Complaint  Patient presents with  . Follow-up     HPI:    Patient is a 46 y.o. Caucasian female who presents for 1 mo f/u chronic pain/severe fibromyalgia. Of note, she feels like she has fully recovered from her recent episode of pneumonia that required hospitalization.  She goes on to say that she has been under a lot of stress lately and everything health-wise has "flared" as a result. No change in diffuse body pain, still with pain essentially everywhere, brought down to tolerable level and allowed to do 20-30 min of housework with her current med regimen + frequent ice/heat, soaks. Still having recurrent "low grade" fevers and occ Temp 102 or so, but she cannot give many details of when or any pattern.  No symptoms consistently accompany her fevers, and she has never had a fever detected here.   She reiterates her belief that she has some kind of autoimmune disease (although exam and lab/radiology w/u have not shown any positive findings to support this), now brings up her dry eye/irritated eyes problem again and says she has been reading about sjogren's syndrome and asks to be tested for this. HAs, and IBS sx's more prominent over the last month. ROS: still with frequent feeling of dyspepsia, GERD, chronic recurrent nausea, intermittent dysuria, occasionally gets an oral ulcer.  No rashes, no joint swelling/redness/warmth.  No chest pain, no cough, no SOB.  Past Medical History  Diagnosis Date  . DDD (degenerative disc disease), lumbar   . Atypical chest pain     Question of coronary vasospasm; cath 10/20/10 NORMAL  . Anxiety   . Fibromyalgia     Dr. Hope Pigeon [phone]  . Interstitial cystitis   . Obesity   . Insomnia   . Transaminasemia 03/2010    (ALT 49), normal on f/u testing off of daily tylenol uuse.  Marland Kitchen GERD (gastroesophageal reflux disease) 06/01/10    Dyspepsia/gastritis, EGD 05/2010: gastritis (NSAIDS)  . Rectal bleeding  06/01/10    Anorectal bleeding/BRBPR--colonoscopy: internal hemorrhoids  . Recurrent UTI     renal u/s normal 07/2007  . Weight loss     CT abd/pelf 08/22/10 remakable only for bile duct 9mm (unchanged from 03/2004 PRIOR to ERCP w/spincterotomy, bile duct balloon dredging and lap chole)  . Recurrent fever     W/u unrevealing, including ID consult.    . Asthma   . Osteopenia     bone densitometry 2006 per pt  . 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 Christella Hartigan  . Multinodular goiter 11/2010    TSH normal  . Chest pain   . Rectal prolapse 02/2011    repaired lap [id number]  . Vitamin d deficiency 08/20/2011  . History of migraine headaches     Dx'd around 2009  . Edema 10/31/2011  . Lobar pneumonia mid May, 2013    RML and RLL, with sepsis--admission to [facility] 12/2011--responded extremely well to rocephin + zithromax.  . IBS (irritable bowel syndrome)   . De Quervain's tenosynovitis, left 11/11/2011    Past Surgical History  Procedure Date  . Bunionectomy     right foot  . Abdominal hysterectomy   . Bilateral salpingoophorectomy 2003    endometriosis  . Cholecystectomy 2005    lap--(cholelithiasis w/choledocholithiasis on u/s 2005--PreOperative ERCP showed NO stone or other obstruction in the biliary tract, but CBD messured 9mm near head of pancreas.  S sphincterotomy and bile duct balloon dredging  was done at that time.  MRCP 10/2010 NORMAL.  Marland Kitchen Orif metatarsal fracture 2006    Right 2nd and 3rd metatarsals (Dr. Romeo Apple)  . Biopsy thyroid 11/2010    Fine needle: NONmalignant goiter  . Rectal prolapse repair, rectopexy 05July2012    with sigmoid colectomy    Outpatient Prescriptions Prior to Visit  Medication Sig Dispense Refill  . amitriptyline (ELAVIL) 100 MG tablet Take 10 mg by mouth at bedtime.       . Calcium Carbonate (CALCIUM 500 PO) Take 1 tablet by mouth daily.       . carisoprodol (SOMA) 250 MG tablet 1 tab po tid  270 tablet  0  . clonazePAM  (KLONOPIN) 1 MG tablet Take 1 mg by mouth 3 (three) times daily as needed. As needed for anxiety, takes one every night for sleep      . Fish Oil OIL Take 2 capsules by mouth daily.       . Fluticasone-Salmeterol (ADVAIR DISKUS) 250-50 MCG/DOSE AEPB Inhale 1 puff into the lungs every 12 (twelve) hours.       Marland Kitchen MAGNESIUM PO Take 1 tablet by mouth daily. Unknown strength      . ondansetron (ZOFRAN-ODT) 8 MG disintegrating tablet Take 1 tablet (8 mg total) by mouth every 8 (eight) hours as needed. For nausea  20 tablet  3  . zolpidem (AMBIEN) 10 MG tablet Take 10 mg by mouth at bedtime.      Marland Kitchen albuterol (PROAIR HFA) 108 (90 BASE) MCG/ACT inhaler Inhale 2 puffs into the lungs every 4 (four) hours as needed. Shortness of breath      . amLODipine (NORVASC) 5 MG tablet Take 5 mg by mouth daily.      Marland Kitchen esomeprazole (NEXIUM) 40 MG capsule Take 40 mg by mouth 2 (two) times daily. 1 tab po bid      . hyoscyamine (LEVSIN SL) 0.125 MG SL tablet Take 1-2 tablets (0.125-0.25 mg total) by mouth every 6 (six) hours as needed for cramping.  90 tablet  3  . meloxicam (MOBIC) 7.5 MG tablet Take 7.5 mg by mouth daily.      Marland Kitchen oxyCODONE (OXY IR/ROXICODONE) 5 MG immediate release tablet Take 1-2 tablets (5-10 mg total) by mouth every 4 (four) hours as needed.  300 tablet  0  . Oxycodone HCl 60 MG TB12 Take 1 tablet (60 mg total) by mouth 2 (two) times daily. Pain contract in chart  60 tablet  0  . phenazopyridine (PYRIDIUM) 200 MG tablet Take 1 tablet (200 mg total) by mouth 3 (three) times daily as needed for pain.  20 tablet  6  . polyethylene glycol powder (GLYCOLAX/MIRALAX) powder DISSOLVE 1 CAPFUL INTO LIQUID AND TAKE BY MOUTH ONE TO THREE TIMES DAILY AS NEEDED FOR CONSTIPATION  1581 g  3  . pramipexole (MIRAPEX) 0.125 MG tablet Take 1-4 tablets (0.125-0.5 mg total) by mouth at bedtime. 1-4 tabs po qhs  90 tablet  1  . SUMAtriptan (IMITREX) 50 MG tablet Take 50 mg by mouth every 2 (two) hours as needed. 1-2 tabs po q2h  prn migraine HA, max in 24h is 300 mg      . Alum & Mag Hydroxide-Simeth (MAGIC MOUTHWASH) SOLN 5ml po swish and spit before meals and prn (diphenhydramine, mylanta, sucralfate  75 mL  1  . fluconazole (DIFLUCAN) 150 MG tablet 1 tab po qd x 3d  3 tablet  1  . furosemide (LASIX) 20 MG tablet Take 20 mg by  mouth daily as needed. For swelling      . polyethylene glycol powder (GLYCOLAX/MIRALAX) powder Take 17 g by mouth daily.  255 g  0  . polyethylene glycol powder (GLYCOLAX/MIRALAX) powder Take 17 g by mouth daily as needed. for constipation      . polyethylene glycol powder (MIRALAX) powder Take 255 g by mouth once.  255 g  0    No Known Allergies  ROS As per HPI  PE: Blood pressure 106/73, pulse 116, height 5' 4.5" (1.638 m), weight 152 lb (68.947 kg). Gen: Alert, well appearing.  Patient is oriented to person, place, time, and situation. ENT:  Eyes: no injection, icteris, swelling, or exudate.  EOMI, PERRLA. Nose: no drainage or turbinate edema/swelling.  No injection or focal lesion.  Mouth: lips without lesion/swelling.  Oral mucosa pink and moist.  Dentition intact and without obvious caries or gingival swelling.  Oropharynx without erythema, exudate, or swelling.  Neck - No masses or thyromegaly or limitation in range of motion CV: RRR, no m/r/g.   LUNGS: CTA bilat, nonlabored resps, good aeration in all lung fields. ABD: soft, NT EXT: trace LE bilat in ankles Musculoskeletal: no joint swelling, erythema, or warmth.  No joint tenderness.  ROM of all joints intact. She responds "yes" about the question of tenderness when essentially any soft tissue region is palpated. Muscle tone normal.  LABS:  none  IMPRESSION AND PLAN:  Chronic headaches Continue prn imitrex, refer to neurologist per her request today.  FIBROMYALGIA, SEVERE No significant change overall.  Continue current pain meds to maintain minimal functionality.   She has second opinion rheumatology consult with [facility]  later this month.  Multinodular goiter She has been euthyroid on multiple checks. Detected on CT chest done for r/o PE 11/2010, then f/u u/s confirmed multinodular thyroid.  Due to size of a few of the nodules/cysts plus one nearby 1.3 cm mediastinal lymph node, an ultrasounded guided biopsy was recommended to r/o malignancy. FN biopsy showed NONmalignant goiter.  Thyroid peroxidase antibody NEG. Check TSH, T4 and T3 today.  Pt requested check for antibodies for sjogren's syndrome due to her dry eye problem and her compilation of other "autoimmune"-type symptoms.    Spent 35 min with pt today, with over 50% of this time spent in counseling and care coordination regarding the above issues.  FOLLOW UP: Return in about 1 month (around 02/24/2012) for f/u chronic pain, recurrent fevers.

## 2012-01-27 NOTE — Assessment & Plan Note (Signed)
Continue prn imitrex, refer to neurologist per her request today.

## 2012-01-27 NOTE — Assessment & Plan Note (Signed)
No significant change overall.  Continue current pain meds to maintain minimal functionality.   She has second opinion rheumatology consult with [facility] later this month.

## 2012-01-27 NOTE — Assessment & Plan Note (Signed)
She has been euthyroid on multiple checks. Detected on CT chest done for r/o PE 11/2010, then f/u u/s confirmed multinodular thyroid.  Due to size of a few of the nodules/cysts plus one nearby 1.3 cm mediastinal lymph node, an ultrasounded guided biopsy was recommended to r/o malignancy. FN biopsy showed NONmalignant goiter.  Thyroid peroxidase antibody NEG. Check TSH, T4 and T3 today.

## 2012-01-28 ENCOUNTER — Ambulatory Visit: Payer: 59 | Admitting: Family Medicine

## 2012-01-28 LAB — SJOGRENS SYNDROME-A EXTRACTABLE NUCLEAR ANTIBODY: SSA (Ro) (ENA) Antibody, IgG: 8 AU/mL (ref <30)

## 2012-02-01 ENCOUNTER — Telehealth: Payer: Self-pay | Admitting: *Deleted

## 2012-02-01 DIAGNOSIS — D509 Iron deficiency anemia, unspecified: Secondary | ICD-10-CM

## 2012-02-01 DIAGNOSIS — E042 Nontoxic multinodular goiter: Secondary | ICD-10-CM

## 2012-02-01 DIAGNOSIS — R110 Nausea: Secondary | ICD-10-CM

## 2012-02-01 DIAGNOSIS — M797 Fibromyalgia: Secondary | ICD-10-CM

## 2012-02-01 MED ORDER — ONDANSETRON 8 MG PO TBDP: 8.0000 mg | ORAL_TABLET | Freq: Three times a day (TID) | ORAL | Status: DC | PRN

## 2012-02-01 MED ORDER — RANITIDINE HCL 150 MG PO TABS: 150.0000 mg | ORAL_TABLET | Freq: Two times a day (BID) | ORAL | Status: DC

## 2012-02-01 NOTE — Telephone Encounter (Signed)
Fax request for ondansetron 90 day supply and new RX for Zantac.  Pt called requesting #180 of phenergan. Pt was given #360 X 1 of phenergan on 11/07/11.  Per pharmacy they will fill for #90 at a time and they have told [facility] this.   Pharmacy will allow #60 of ondansetron for 90 days.  Pt has current RX for nexium, but is not eligible for refill for 26 more days.  Therefore, she can not get 90 day supply before 6/30.  Pt is requesting zantac instead. Verbal from Dr. Milinda Cave OK to fill ondansetron for #60, zantac 150mg  BID #[id number].  No change in phenergan. Message left for patient to return call to discuss med refills.

## 2012-02-06 NOTE — Telephone Encounter (Signed)
No return call.  Ending follow up. 

## 2012-02-22 ENCOUNTER — Encounter: Payer: Self-pay | Admitting: Family Medicine

## 2012-02-22 ENCOUNTER — Ambulatory Visit (INDEPENDENT_AMBULATORY_CARE_PROVIDER_SITE_OTHER): Payer: BC Managed Care – PPO | Admitting: Family Medicine

## 2012-02-22 VITALS — BP 113/74 | HR 88 | Ht 64.5 in | Wt 152.0 lb

## 2012-02-22 DIAGNOSIS — F411 Generalized anxiety disorder: Secondary | ICD-10-CM

## 2012-02-22 DIAGNOSIS — E042 Nontoxic multinodular goiter: Secondary | ICD-10-CM

## 2012-02-22 DIAGNOSIS — R51 Headache: Secondary | ICD-10-CM

## 2012-02-22 DIAGNOSIS — M79671 Pain in right foot: Secondary | ICD-10-CM

## 2012-02-22 DIAGNOSIS — A689 Relapsing fever, unspecified: Secondary | ICD-10-CM

## 2012-02-22 DIAGNOSIS — J4540 Moderate persistent asthma, uncomplicated: Secondary | ICD-10-CM

## 2012-02-22 DIAGNOSIS — J45909 Unspecified asthma, uncomplicated: Secondary | ICD-10-CM

## 2012-02-22 DIAGNOSIS — R110 Nausea: Secondary | ICD-10-CM

## 2012-02-22 DIAGNOSIS — M79609 Pain in unspecified limb: Secondary | ICD-10-CM

## 2012-02-22 DIAGNOSIS — H04129 Dry eye syndrome of unspecified lacrimal gland: Secondary | ICD-10-CM

## 2012-02-22 DIAGNOSIS — IMO0001 Reserved for inherently not codable concepts without codable children: Secondary | ICD-10-CM

## 2012-02-22 DIAGNOSIS — M797 Fibromyalgia: Secondary | ICD-10-CM

## 2012-02-22 MED ORDER — OXYCODONE HCL 5 MG PO TABS: 5.0000 mg | ORAL_TABLET | ORAL | Status: DC | PRN

## 2012-02-22 MED ORDER — OXYCODONE HCL 60 MG PO TB12: 1.0000 | ORAL_TABLET | Freq: Two times a day (BID) | ORAL | Status: DC

## 2012-02-22 NOTE — Progress Notes (Signed)
OFFICE VISIT  02/23/2012   CC:  Chief Complaint  Patient presents with  . Follow-up    fibromyalgia     HPI:    Patient is a 46 y.o. Caucasian female who presents for 1 month follow up fibromyalgia. Has appt at [facility] rheum next Tuesday for second opinion regarding her fibromyalgia vs possible autoimmune dz. She reports no change in her pain: 8/10 upon awakening, down to 4-5/10 with current pain med regimen.  She hurts everywhere--literally . She has now completely weened herself off of amitriptyline.  She rarely takes a dose of mobic.  She still does heat, ice.  Is still able to only to 15 min of light housework before she has to lie down and rest.  Denies joint erythema or swelling.  Denies oral ulcers or rash.  Migraines still frequent, 2-3 per week, lasting 4-5 hours usually but occasionally a couple of days.  Says imitrex has been a helpful abortive med.  We discussed her hx of prophylactic migraine med trials and she's not interested in going down this road again right now.    Says she is still having constant pain in feet and hands, burning character, intermittent periods of acute worsening of intensity, plus her hands and feet sometimes turn "blue" sometimes "bright red".  They have never looked anything but normal when I have examined her.  Regarding her "typical" febrile episodes, she last had one of these about 6 wks ago.  She says over the last month she's been having more of "low grade" fevers (99-100.4) and more morning achiness and chills.  Also mentions some random periods of feeling itchy all over, but gets no rash or skin change.  She describes still having "IBS-like" cramps, chronic nausea--no changes here.  NO vomiting, no blood or pus in stool, no diarrhea.  Her asthma control is "good" but when asked how many times she uses albuterol per week she says two times average, usually at night after having a reflux episode while sleeping.  She says she is compliant with her  advair. Past Medical History  Diagnosis Date  . DDD (degenerative disc disease), lumbar   . Atypical chest pain     Question of coronary vasospasm; cath 10/20/10 NORMAL  . Anxiety   . Fibromyalgia     Dr. Hope Pigeon [phone]  . Interstitial cystitis   . Obesity   . Insomnia   . Transaminasemia 03/2010    (ALT 49), normal on f/u testing off of daily tylenol uuse.  Marland Kitchen GERD (gastroesophageal reflux disease) 06/01/10    Dyspepsia/gastritis, EGD 05/2010: gastritis (NSAIDS)  . Rectal bleeding 06/01/10    Anorectal bleeding/BRBPR--colonoscopy: internal hemorrhoids  . Recurrent UTI     renal u/s normal 07/2007  . Weight loss     CT abd/pelf 08/22/10 remakable only for bile duct 9mm (unchanged from 03/2004 PRIOR to ERCP w/spincterotomy, bile duct balloon dredging and lap chole)  . Recurrent fever     W/u unrevealing, including ID consult.    . Asthma   . Osteopenia     bone densitometry 2006 per pt  . 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 Christella Hartigan  . Multinodular goiter 11/2010    TSH normal  . Chest pain   . Rectal prolapse 02/2011    repaired lap [id number]  . Vitamin d deficiency 08/20/2011  . History of migraine headaches     Dx'd around 2009  . Edema 10/31/2011  . Lobar pneumonia mid May, 2013  RML and RLL, with sepsis--admission to [facility] 12/2011--responded extremely well to rocephin + zithromax.  . IBS (irritable bowel syndrome)   . De Quervain's tenosynovitis, left 11/11/2011    Past Surgical History  Procedure Date  . Bunionectomy     right foot  . Abdominal hysterectomy   . Bilateral salpingoophorectomy 2003    endometriosis  . Cholecystectomy 2005    lap--(cholelithiasis w/choledocholithiasis on u/s 2005--PreOperative ERCP showed NO stone or other obstruction in the biliary tract, but CBD messured 9mm near head of pancreas.  S sphincterotomy and bile duct balloon dredging was done at that time.  MRCP 10/2010 NORMAL.  Marland Kitchen Orif metatarsal fracture  2006    Right 2nd and 3rd metatarsals (Dr. Romeo Apple)  . Biopsy thyroid 11/2010    Fine needle: NONmalignant goiter  . Rectal prolapse repair, rectopexy 05July2012    with sigmoid colectomy    Outpatient Prescriptions Prior to Visit  Medication Sig Dispense Refill  . albuterol (PROAIR HFA) 108 (90 BASE) MCG/ACT inhaler Inhale 2 puffs into the lungs every 4 (four) hours as needed. Shortness of breath  1 Inhaler  1  . amitriptyline (ELAVIL) 100 MG tablet Take 10 mg by mouth at bedtime.       Marland Kitchen amLODipine (NORVASC) 5 MG tablet Take 1 tablet (5 mg total) by mouth daily.  90 tablet  1  . Calcium Carbonate (CALCIUM 500 PO) Take 1 tablet by mouth daily.       . carisoprodol (SOMA) 250 MG tablet 1 tab po tid  270 tablet  0  . clonazePAM (KLONOPIN) 1 MG tablet Take 1 mg by mouth 3 (three) times daily as needed. As needed for anxiety, takes one every night for sleep      . esomeprazole (NEXIUM) 40 MG capsule Take 1 capsule (40 mg total) by mouth 2 (two) times daily. 1 tab po bid  180 capsule  1  . Fish Oil OIL Take 2 capsules by mouth daily.       . Fluticasone-Salmeterol (ADVAIR DISKUS) 250-50 MCG/DOSE AEPB Inhale 1 puff into the lungs every 12 (twelve) hours.       . hyoscyamine (LEVSIN SL) 0.125 MG SL tablet Take 1-2 tablets (0.125-0.25 mg total) by mouth every 6 (six) hours as needed for cramping.  270 tablet  1  . MAGNESIUM PO Take 1 tablet by mouth daily. Unknown strength      . meloxicam (MOBIC) 7.5 MG tablet Take 1 tablet (7.5 mg total) by mouth daily.  90 tablet  1  . ondansetron (ZOFRAN-ODT) 8 MG disintegrating tablet Take 1 tablet (8 mg total) by mouth every 8 (eight) hours as needed. For nausea  60 tablet  1  . phenazopyridine (PYRIDIUM) 200 MG tablet Take 1 tablet (200 mg total) by mouth 3 (three) times daily as needed for pain.  60 tablet  1  . polyethylene glycol powder (GLYCOLAX/MIRALAX) powder DISSOLVE 1 CAPFUL INTO LIQUID AND TAKE BY MOUTH ONE TO THREE TIMES DAILY AS NEEDED FOR  CONSTIPATION  1581 g  3  . pramipexole (MIRAPEX) 0.125 MG tablet 3 tabs po qhs  270 tablet  1  . ranitidine (ZANTAC) 150 MG tablet Take 1 tablet (150 mg total) by mouth 2 (two) times daily.  180 tablet  1  . sucralfate (CARAFATE) 1 G tablet 1 tab about 1 hour prior to each meal prn  90 tablet  1  . SUMAtriptan (IMITREX) 50 MG tablet Take 2 tablets (100 mg total) by mouth every 2 (  two) hours as needed (migraine HA (max of 200mg /24h).  27 tablet  1  . zolpidem (AMBIEN) 10 MG tablet Take 10 mg by mouth at bedtime.      Marland Kitchen oxyCODONE (OXY IR/ROXICODONE) 5 MG immediate release tablet Take 1-2 tablets (5-10 mg total) by mouth every 4 (four) hours as needed.  300 tablet  0  . Oxycodone HCl 60 MG TB12 Take 1 tablet (60 mg total) by mouth 2 (two) times daily. Pain contract in chart  60 tablet  0    No Known Allergies  ROS As per HPI  PE: Blood pressure 113/74, pulse 88, height 5' 4.5" (1.638 m), weight 152 lb (68.947 kg). Gen: Alert, well appearing.  Patient is oriented to person, place, time, and situation. Affect: pleasant, lucid thought and speech. ENT:  Eyes: no injection, icteris, swelling, or exudate.  EOMI, PERRLA. Nose: no drainage or turbinate edema/swelling.  No injection or focal lesion.  Mouth: lips without lesion/swelling.  Oral mucosa pink and moist.  Dentition intact and without obvious caries or gingival swelling.  Oropharynx without erythema, exudate, or swelling.  Neck - No masses or thyromegaly or limitation in range of motion CV: RRR, no m/r/g.   LUNGS: CTA bilat, nonlabored resps, good aeration in all lung fields. ABD: soft, nondistended, BS slightly increased but no high pitched "tinkling" or "rushes".  Just soft palpation causes her pain --she even flinches.  Deep palpation is not possible, no organomegaly could not adequately be checked for. EXT: no clubbing, cyanosis, or edema.   Skin of the hands and feet are pink, without mottling.  Temperature is normal.  Peripheral pulses  are 2+ and symmetric. MUSC: she is tender to touch EVERYWHERE I touch her from head to toe.  LABS:  None today  Lab Results  Component Value Date   WBC 6.1 01/25/2012   HGB 12.8 01/25/2012   HCT 38.7 01/25/2012   MCV 92.7 01/25/2012   PLT 231.0 01/25/2012     Chemistry      Component Value Date/Time   NA 134* 01/25/2012 1439   K 4.2 01/25/2012 1439   CL 96 01/25/2012 1439   CO2 29 01/25/2012 1439   BUN 4* 01/25/2012 1439   CREATININE 0.7 01/25/2012 1439   CREATININE 0.75 12/28/2011 1041      Component Value Date/Time   CALCIUM 9.5 01/25/2012 1439   ALKPHOS 88 01/25/2012 1439   AST 19 01/25/2012 1439   ALT 15 01/25/2012 1439   BILITOT 0.4 01/25/2012 1439     Lab Results  Component Value Date   TSH 0.45 01/25/2012   Lab Results  Component Value Date   ESRSEDRATE 14 01/25/2012   Lab Results  Component Value Date   CKTOTAL 64 06/12/2011   Lab Results  Component Value Date   ANA POS* 12/04/2011   RF <10 12/04/2011  **Note: this ANA above was actually negative b/c her titer came back showing NOTHING.   IMPRESSION AND PLAN:  FIBROMYALGIA, SEVERE She is miserable but this is no change from her usual condition.  She'll continue current meds and behavioral measures to limit pain.   I've made it clear I've gone as high as I'm going to go with her narcotic pain meds. She has [facility] rheum appt early next week for a second opinion.  She is still pretty convinced that she has an autoimmune disease but we (Dr. Jon Billings --rheum in [place], and I) have repeatedly come up empty when it comes to physical exam findings  and lab work to support this.   I gave her rx's for this month's pain meds today.   Chronic headaches She has migrainous and tension type HA's--still frequent.   She finds some improvement since getting on prn imitrex. She failed a trial of topamax as prophylactic migraine med in the past (tingling side effect), failed trial of amitriptyline. She is not interested in starting any new  prophylaxis med at this time.  FEVER, RECURRENT Stable, occurring less frequently last few months. As stated in previous notes, no etiology has been found despite extensive workup, and we have never documented a fever or seen her in the midst of one of her febrile episodes.  Pain in both feet Question of reflex sympathetic dystrophy. She also describes some color and temperature changes c/w raynaud's. However, I have never seen any of these findings when I examine her. She has neurologist consult at [facility] 03/07/12.  NAUSEA Still ongoing and I do not know what is causing it. Diagnostic w/u normal.  She still takes quite a bit of phenergan and zofran.  Asthma, moderate persistent Still sounds like she is not ideally controlled, but I have never seen her in my office with any wheezing or labored breathing. Her symptoms sound mild/vague, and I suspect she may sometimes use her albuterol too liberally. Will continue with current advair dosing, albuterol MDI q4h prn.  ANXIETY STATE, UNSPECIFIED Her anxiety and depression are issues that impact her every day, but she has repeatedly refused to address this by seeing a psychiatrist or counselor for any significant amount of time.  The few times she has seen a counselor/psychiatrist, she feels like she is worse after each appointment. Currently on clonazepam and no antidepressant and this is what she wants to continue.    FOLLOW UP: Return in about 1 month (around 03/24/2012) for f/u fibromyalgia, migraines, fevers.

## 2012-02-23 DIAGNOSIS — J4540 Moderate persistent asthma, uncomplicated: Secondary | ICD-10-CM | POA: Insufficient documentation

## 2012-02-23 NOTE — Assessment & Plan Note (Signed)
She is miserable but this is no change from her usual condition.  She'll continue current meds and behavioral measures to limit pain.   I've made it clear I've gone as high as I'm going to go with her narcotic pain meds. She has [facility] rheum appt early next week for a second opinion.  She is still pretty convinced that she has an autoimmune disease but we (Dr. Jon Billings --rheum in [place], and I) have repeatedly come up empty when it comes to physical exam findings and lab work to support this.   I gave her rx's for this month's pain meds today.

## 2012-02-23 NOTE — Assessment & Plan Note (Signed)
Question of reflex sympathetic dystrophy. She also describes some color and temperature changes c/w raynaud's. However, I have never seen any of these findings when I examine her. She has neurologist consult at [facility] 03/07/12.

## 2012-02-23 NOTE — Assessment & Plan Note (Addendum)
Stable, occurring less frequently last few months. As stated in previous notes, no etiology has been found despite extensive workup, and we have never documented a fever or seen her in the midst of one of her febrile episodes.

## 2012-02-23 NOTE — Assessment & Plan Note (Signed)
Still ongoing and I do not know what is causing it. Diagnostic w/u normal.  She still takes quite a bit of phenergan and zofran.

## 2012-02-23 NOTE — Assessment & Plan Note (Signed)
She has migrainous and tension type HA's--still frequent.   She finds some improvement since getting on prn imitrex. She failed a trial of topamax as prophylactic migraine med in the past (tingling side effect), failed trial of amitriptyline. She is not interested in starting any new prophylaxis med at this time.

## 2012-02-23 NOTE — Assessment & Plan Note (Signed)
Still sounds like she is not ideally controlled, but I have never seen her in my office with any wheezing or labored breathing. Her symptoms sound mild/vague, and I suspect she may sometimes use her albuterol too liberally. Will continue with current advair dosing, albuterol MDI q4h prn.

## 2012-02-23 NOTE — Assessment & Plan Note (Signed)
Her anxiety and depression are issues that impact her every day, but she has repeatedly refused to address this by seeing a psychiatrist or counselor for any significant amount of time.  The few times she has seen a counselor/psychiatrist, she feels like she is worse after each appointment. Currently on clonazepam and no antidepressant and this is what she wants to continue.

## 2012-03-10 ENCOUNTER — Encounter: Payer: Self-pay | Admitting: Family Medicine

## 2012-03-13 ENCOUNTER — Telehealth: Payer: Self-pay | Admitting: Family Medicine

## 2012-03-13 NOTE — Telephone Encounter (Signed)
Patient called in stating her ears are red and she is running a 102 fever. I offered her an 03/14/12 appt. She said she would have to think about it and call back.

## 2012-03-13 NOTE — Telephone Encounter (Signed)
Pt is out of town in [place].  She is having green phlegm for 2 weeks, her sister looked at her ears and says they red and bulging.  Pt states her temp is 102.6 up to 103.  She is "a Engineer, civil (consulting) and although [she] doesn't know everything, [she] is pretty sure [she] has an ear infection".  Pt has left over antibiotics that she is going to take.   Advised pt if any worsening symptoms to seek care at [facility] or ER in [place]. If things get worse she will come see Dr. Milinda Cave.

## 2012-03-21 ENCOUNTER — Ambulatory Visit: Payer: BC Managed Care – PPO | Admitting: Family Medicine

## 2012-03-24 ENCOUNTER — Encounter: Payer: Self-pay | Admitting: Family Medicine

## 2012-03-24 ENCOUNTER — Ambulatory Visit (INDEPENDENT_AMBULATORY_CARE_PROVIDER_SITE_OTHER): Payer: BC Managed Care – PPO | Admitting: Family Medicine

## 2012-03-24 VITALS — BP 123/78 | HR 89 | Temp 97.9°F | Ht 64.5 in | Wt 152.1 lb

## 2012-03-24 DIAGNOSIS — IMO0001 Reserved for inherently not codable concepts without codable children: Secondary | ICD-10-CM

## 2012-03-24 DIAGNOSIS — F411 Generalized anxiety disorder: Secondary | ICD-10-CM

## 2012-03-24 DIAGNOSIS — H04129 Dry eye syndrome of unspecified lacrimal gland: Secondary | ICD-10-CM

## 2012-03-24 DIAGNOSIS — A689 Relapsing fever, unspecified: Secondary | ICD-10-CM

## 2012-03-24 DIAGNOSIS — E042 Nontoxic multinodular goiter: Secondary | ICD-10-CM

## 2012-03-24 DIAGNOSIS — F4321 Adjustment disorder with depressed mood: Secondary | ICD-10-CM

## 2012-03-24 DIAGNOSIS — M797 Fibromyalgia: Secondary | ICD-10-CM

## 2012-03-24 DIAGNOSIS — F4320 Adjustment disorder, unspecified: Secondary | ICD-10-CM | POA: Insufficient documentation

## 2012-03-24 MED ORDER — CLONAZEPAM 1 MG PO TABS: ORAL_TABLET | ORAL | Status: DC

## 2012-03-24 MED ORDER — OXYCODONE HCL 5 MG PO TABS: 5.0000 mg | ORAL_TABLET | ORAL | Status: DC | PRN

## 2012-03-24 MED ORDER — OXYCODONE HCL 60 MG PO TB12: 1.0000 | ORAL_TABLET | Freq: Two times a day (BID) | ORAL | Status: DC

## 2012-03-24 NOTE — Assessment & Plan Note (Signed)
Secondary to husband being unfaithful and the impending divorce, etc. She declined my offer of antidepressant or counseling referral for now and wants to wait and see how the next month goes.

## 2012-03-24 NOTE — Assessment & Plan Note (Addendum)
No change, still suffering significantly and is unable to work.  I'll write a brief letter stating such so that she can present this as part of her appeal case for long term disability.  RF'd pain meds and clonazepam today. Her tightest and most significantly tender areas lately are the tops of her traps in the shoulder regions bilat---I rx'd Dermatran myofascial compound (baclofen, ketamine, orphenadrine, cyclobenzaprine, bupivicaine, and gabapentin) cream to appy qid to this area prn, 240g, RF x 3.

## 2012-03-24 NOTE — Progress Notes (Signed)
OFFICE VISIT  03/24/2012   CC:  Chief Complaint  Patient presents with  . Follow-up    fibromyalgia, migraines, fevers     HPI:    Patient is a 46 y.o. Caucasian female who presents for routine 1 month f/u severe fibromyalgia, migraine HAs, hx of recurrent unexplained febrile episodes, IBS, question of reflex sympathetic dystrophy (and question of Raynaud's disease).  Since last f/u visit she has seen a neurologist for her HA's (nortriptyline rx'd but pt hasn't started it) and has seen a rheumatologist at [facility] for 2nd opinion regarding her chronic musculoskeletal pain.  Describes her trip to [facility] as "uneventful" and all blood tests normal per her report. She was given no answers/explanations.  Still feels frustrated, as usual.   Since she did not improve any on amitriptyline in the past she is not interested in trying nortriptyline.  She says her husband cheated on her and they are getting divorced.   She asks for me to write a letter stating she is unable to work so she can carry out an appeal regarding long-term disability.    Past Medical History  Diagnosis Date  . DDD (degenerative disc disease), lumbar   . Atypical chest pain     Question of coronary vasospasm; cath 10/20/10 NORMAL  . Anxiety   . Fibromyalgia     Dr. Hope Pigeon [phone]  . Interstitial cystitis   . Obesity   . Insomnia   . Transaminasemia 03/2010    (ALT 49), normal on f/u testing off of daily tylenol uuse.  Marland Kitchen GERD (gastroesophageal reflux disease) 06/01/10    Dyspepsia/gastritis, EGD 05/2010: gastritis (NSAIDS)  . Rectal bleeding 06/01/10    Anorectal bleeding/BRBPR--colonoscopy: internal hemorrhoids  . Recurrent UTI     renal u/s normal 07/2007  . Weight loss     CT abd/pelf 08/22/10 remakable only for bile duct 9mm (unchanged from 03/2004 PRIOR to ERCP w/spincterotomy, bile duct balloon dredging and lap chole)  . Recurrent fever     W/u unrevealing, including ID consult.    . Asthma   . Osteopenia      bone densitometry 2006 per pt  . 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 Christella Hartigan  . Multinodular goiter 11/2010    TSH normal  . Chest pain   . Rectal prolapse 02/2011    repaired lap [id number]  . Vitamin d deficiency 08/20/2011  . History of migraine headaches     Dr. Anne Hahn at [facility] 03/2012: started nortriptyline 10mg --titrating to 3 caps qhs  . Edema 10/31/2011  . Lobar pneumonia mid May, 2013    RML and RLL, with sepsis--admission to [facility] 12/2011--responded extremely well to rocephin + zithromax.  . IBS (irritable bowel syndrome)   . De Quervain's tenosynovitis, left 11/11/2011    Past Surgical History  Procedure Date  . Bunionectomy     right foot  . Abdominal hysterectomy   . Bilateral salpingoophorectomy 2003    endometriosis  . Cholecystectomy 2005    lap--(cholelithiasis w/choledocholithiasis on u/s 2005--PreOperative ERCP showed NO stone or other obstruction in the biliary tract, but CBD messured 9mm near head of pancreas.  S sphincterotomy and bile duct balloon dredging was done at that time.  MRCP 10/2010 NORMAL.  Marland Kitchen Orif metatarsal fracture 2006    Right 2nd and 3rd metatarsals (Dr. Romeo Apple)  . Biopsy thyroid 11/2010    Fine needle: NONmalignant goiter  . Rectal prolapse repair, rectopexy 05July2012    with sigmoid colectomy  Outpatient Prescriptions Prior to Visit  Medication Sig Dispense Refill  . albuterol (PROAIR HFA) 108 (90 BASE) MCG/ACT inhaler Inhale 2 puffs into the lungs every 4 (four) hours as needed. Shortness of breath  1 Inhaler  1  . amLODipine (NORVASC) 5 MG tablet Take 1 tablet (5 mg total) by mouth daily.  90 tablet  1  . Calcium Carbonate (CALCIUM 500 PO) Take 1 tablet by mouth daily.       . carisoprodol (SOMA) 250 MG tablet 1 tab po tid  270 tablet  0  . esomeprazole (NEXIUM) 40 MG capsule Take 1 capsule (40 mg total) by mouth 2 (two) times daily. 1 tab po bid  180 capsule  1  . Fish Oil OIL Take 2 capsules by  mouth daily.       . Fluticasone-Salmeterol (ADVAIR DISKUS) 250-50 MCG/DOSE AEPB Inhale 1 puff into the lungs every 12 (twelve) hours.       . hyoscyamine (LEVSIN SL) 0.125 MG SL tablet Take 1-2 tablets (0.125-0.25 mg total) by mouth every 6 (six) hours as needed for cramping.  270 tablet  1  . MAGNESIUM PO Take 1 tablet by mouth daily. Unknown strength      . meloxicam (MOBIC) 7.5 MG tablet Take 1 tablet (7.5 mg total) by mouth daily.  90 tablet  1  . ondansetron (ZOFRAN-ODT) 8 MG disintegrating tablet Take 1 tablet (8 mg total) by mouth every 8 (eight) hours as needed. For nausea  60 tablet  1  . phenazopyridine (PYRIDIUM) 200 MG tablet Take 1 tablet (200 mg total) by mouth 3 (three) times daily as needed for pain.  60 tablet  1  . polyethylene glycol powder (GLYCOLAX/MIRALAX) powder DISSOLVE 1 CAPFUL INTO LIQUID AND TAKE BY MOUTH ONE TO THREE TIMES DAILY AS NEEDED FOR CONSTIPATION  1581 g  3  . pramipexole (MIRAPEX) 0.125 MG tablet 3 tabs po qhs  270 tablet  1  . ranitidine (ZANTAC) 150 MG tablet Take 1 tablet (150 mg total) by mouth 2 (two) times daily.  180 tablet  1  . SUMAtriptan (IMITREX) 50 MG tablet Take 2 tablets (100 mg total) by mouth every 2 (two) hours as needed (migraine HA (max of 200mg /24h).  27 tablet  1  . zolpidem (AMBIEN) 10 MG tablet Take 10 mg by mouth at bedtime.      Marland Kitchen amitriptyline (ELAVIL) 100 MG tablet Take 10 mg by mouth at bedtime.       . clonazePAM (KLONOPIN) 1 MG tablet Take 1 mg by mouth 3 (three) times daily as needed. As needed for anxiety, takes one every night for sleep      . oxyCODONE (OXY IR/ROXICODONE) 5 MG immediate release tablet Take 1-2 tablets (5-10 mg total) by mouth every 4 (four) hours as needed.  300 tablet  0  . Oxycodone HCl 60 MG TB12 Take 1 tablet (60 mg total) by mouth 2 (two) times daily. Pain contract in chart  60 tablet  0  . sucralfate (CARAFATE) 1 G tablet 1 tab about 1 hour prior to each meal prn  90 tablet  1  **Not taking  amitriptyline--she weened off this  No Known Allergies  ROS As per HPI  PE: Blood pressure 123/78, pulse 89, temperature 97.9 F (36.6 C), temperature source Temporal, height 5' 4.5" (1.638 m), weight 152 lb 1.9 oz (69.001 kg), SpO2 96.00%. Gen: Alert, well appearing.  Patient is oriented to person, place, time, and situation. ENT: Ears: EACs clear,  normal epithelium.  TMs with good light reflex and landmarks bilaterally.  Eyes: no injection, icteris, swelling, or exudate.  EOMI, PERRLA. Nose: no drainage or turbinate edema/swelling.  No injection or focal lesion.  Mouth: lips without lesion/swelling.  Oral mucosa pink and moist.  Dentition intact and without obvious caries or gingival swelling.  Oropharynx without erythema, exudate, or swelling.  Neck - No masses or thyromegaly or limitation in range of motion CV: RRR, no m/r/g.   LUNGS: CTA bilat, nonlabored resps, good aeration in all lung fields. EXT: no clubbing, cyanosis, or edema.  Skin - no sores or suspicious lesions or rashes or color changes MUSC: she is tender essentially everywhere: back, neck, shoulders, upper arms, lower arms, lateral hips, thighs, calves, chin areas.  No joint erythema, warmth, or swelling.  Joints with ROM intact.  LABS:  None today  IMPRESSION AND PLAN:  FIBROMYALGIA, SEVERE No change, still suffering significantly and is unable to work.  I'll write a brief letter stating such so that she can present this as part of her appeal case for long term disability.  RF'd pain meds and clonazepam today. Her tightest and most significantly tender areas lately are the tops of her traps in the shoulder regions bilat---I rx'd Dermatran myofascial compound (baclofen, ketamine, orphenadrine, cyclobenzaprine, bupivicaine, and gabapentin) cream to appy qid to this area prn, 240g, RF x 3.  Adjustment disorder with depressed mood Secondary to husband being unfaithful and the impending divorce, etc. She declined my offer  of antidepressant or counseling referral for now and wants to wait and see how the next month goes.   FEVER, RECURRENT This is stable: still gets approx 1-2 episodes every 1-2 months on avg.  As stated in the past, extensive w/u by myself and infectious disease MD was unremarkable. The only documented infections she has had are pneumonia on one occasion, and possibly UTI (at times this has been unclear b/c she has hx of IC).  I will obtain the labs that were recently done at [facility] rheum. At next visit, we'll discuss the possibility of doing a basic immunodeficiency w/u. No labs done today.  ANXIETY STATE, UNSPECIFIED No change.  She struggles a lot with emotional and anxiety problems, has failed trials of multiple antidepressants in the past, primarily due to her inability to tolerate them long enough.  She'll continue on clonazepam 1mg  q12 prn, #180 (3 mo supply) rx'd today.    FOLLOW UP: Return in about 1 month (around 04/24/2012) for f/u chronic pain.

## 2012-03-24 NOTE — Assessment & Plan Note (Signed)
No change.  She struggles a lot with emotional and anxiety problems, has failed trials of multiple antidepressants in the past, primarily due to her inability to tolerate them long enough.  She'll continue on clonazepam 1mg  q12 prn, #180 (3 mo supply) rx'd today.

## 2012-03-24 NOTE — Assessment & Plan Note (Signed)
This is stable: still gets approx 1-2 episodes every 1-2 months on avg.  As stated in the past, extensive w/u by myself and infectious disease MD was unremarkable. The only documented infections she has had are pneumonia on one occasion, and possibly UTI (at times this has been unclear b/c she has hx of IC).  I will obtain the labs that were recently done at [facility] rheum. At next visit, we'll discuss the possibility of doing a basic immunodeficiency w/u. No labs done today.

## 2012-03-27 ENCOUNTER — Encounter: Payer: Self-pay | Admitting: Family Medicine

## 2012-04-10 ENCOUNTER — Other Ambulatory Visit: Payer: Self-pay | Admitting: *Deleted

## 2012-04-10 MED ORDER — CARISOPRODOL 250 MG PO TABS: 250.0000 mg | ORAL_TABLET | Freq: Three times a day (TID) | ORAL | Status: DC

## 2012-04-10 MED ORDER — ESOMEPRAZOLE MAGNESIUM 40 MG PO CPDR: 40.0000 mg | DELAYED_RELEASE_CAPSULE | Freq: Two times a day (BID) | ORAL | Status: DC

## 2012-04-10 MED ORDER — ZOLPIDEM TARTRATE 10 MG PO TABS: 10.0000 mg | ORAL_TABLET | Freq: Every day | ORAL | Status: DC

## 2012-04-10 NOTE — Telephone Encounter (Signed)
Pt is requesting 90 day supply of Ambien, Soma and Nexium.   Nexium RX sent.   Please advise quantity and refills for Soma and Ambien.  Thanks.

## 2012-04-10 NOTE — Telephone Encounter (Signed)
Caller: [name]/Patient; Patient Name: Alexandra Reid; PCP: [facility]); Best Callback Phone Number: [phone].  Call regarding Ambien and Soma refills, Scripts not at Pharmacy per Patient.  Advised Patient,  Per EPIC, Nexium was sent to [facility] but not Ambien or Soma.  Patient denies triage.  Please follow up with Patient.

## 2012-04-10 NOTE — Telephone Encounter (Signed)
Pt notified that RX's have now been done.  Reminded pt of our refill policy and she needs to give [place] time to complete these tasks.  Pt agreeable and apologizes for calling several times to check status. RX's faxed to [facility].

## 2012-04-21 ENCOUNTER — Ambulatory Visit: Payer: BC Managed Care – PPO | Admitting: Family Medicine

## 2012-04-23 ENCOUNTER — Ambulatory Visit (INDEPENDENT_AMBULATORY_CARE_PROVIDER_SITE_OTHER): Payer: BC Managed Care – PPO | Admitting: Family Medicine

## 2012-04-23 ENCOUNTER — Encounter: Payer: Self-pay | Admitting: Family Medicine

## 2012-04-23 VITALS — BP 124/78 | HR 67 | Temp 98.2°F | Ht 64.5 in | Wt 155.0 lb

## 2012-04-23 DIAGNOSIS — R82998 Other abnormal findings in urine: Secondary | ICD-10-CM

## 2012-04-23 DIAGNOSIS — IMO0001 Reserved for inherently not codable concepts without codable children: Secondary | ICD-10-CM

## 2012-04-23 DIAGNOSIS — F4320 Adjustment disorder, unspecified: Secondary | ICD-10-CM

## 2012-04-23 DIAGNOSIS — M797 Fibromyalgia: Secondary | ICD-10-CM

## 2012-04-23 DIAGNOSIS — A689 Relapsing fever, unspecified: Secondary | ICD-10-CM

## 2012-04-23 DIAGNOSIS — R51 Headache: Secondary | ICD-10-CM

## 2012-04-23 DIAGNOSIS — E042 Nontoxic multinodular goiter: Secondary | ICD-10-CM

## 2012-04-23 DIAGNOSIS — R8290 Unspecified abnormal findings in urine: Secondary | ICD-10-CM

## 2012-04-23 DIAGNOSIS — H04129 Dry eye syndrome of unspecified lacrimal gland: Secondary | ICD-10-CM

## 2012-04-23 DIAGNOSIS — R300 Dysuria: Secondary | ICD-10-CM

## 2012-04-23 LAB — POCT URINALYSIS DIPSTICK
Nitrite, UA: NEGATIVE
Protein, UA: NEGATIVE
Urobilinogen, UA: 0.2

## 2012-04-23 MED ORDER — OXYCODONE HCL 60 MG PO TB12: 1.0000 | ORAL_TABLET | Freq: Two times a day (BID) | ORAL | Status: DC

## 2012-04-23 MED ORDER — PRAMIPEXOLE DIHYDROCHLORIDE 0.125 MG PO TABS: ORAL_TABLET | ORAL | Status: DC

## 2012-04-23 MED ORDER — OXYCODONE HCL 5 MG PO TABS: 5.0000 mg | ORAL_TABLET | ORAL | Status: DC | PRN

## 2012-04-23 MED ORDER — PHENAZOPYRIDINE HCL 200 MG PO TABS: 200.0000 mg | ORAL_TABLET | Freq: Three times a day (TID) | ORAL | Status: AC | PRN

## 2012-04-23 NOTE — Assessment & Plan Note (Signed)
With leukocytes on UA.  Will send for c/s and hold off on antibiotics for now. She has hx of interstitial cystitis, continue pyridium (rx RF'd today).

## 2012-04-23 NOTE — Assessment & Plan Note (Signed)
Problem stable.  Continue current medications and diet appropriate for this condition.  We have reviewed our general long term plan for this problem and also reviewed symptoms and signs that should prompt the patient to call or return to the office. RF'd oxycontin and oxycodone today.

## 2012-04-23 NOTE — Assessment & Plan Note (Signed)
With anxiety and depression: fairly stable considering what's she's going through.  She declined offer of counseling or additional med at this time.

## 2012-04-23 NOTE — Assessment & Plan Note (Signed)
Bothering her slightly worse in the past month, stress playing a large role in this. Continue current meds.

## 2012-04-23 NOTE — Progress Notes (Signed)
OFFICE NOTE  04/23/2012  CC:  Chief Complaint  Patient presents with  . Follow-up    fibromyalgia     HPI: Patient is a 46 y.o. Caucasian female who is here for 1 mo f/u fibromyalgia, anxiety/depression, migraine HAs. Has been having a couple of weeks of dysuria, urinary frequency and urgency, no blood noted but occ "dark" appearing.  Trying to stay hydrated. Reports more migraines in the last month (2-3 per week), describes typical one as sharp pain in frontal and orbital area diffusely, with lots of nausea, usually throbbing follows, sometimes it is more focused behind one orbit.   Nausea unchanged. Pain is unchanged: "persistently bad". Admits to having grief over recent divorce but thinks she's handling it well and declines my offer of medication or counseling assistance. Due to cost, she wants to try d/c nexium and try 4$ [facility] rx --H2 blocker at max dose.  Pertinent PMH:  Past Medical History  Diagnosis Date  . DDD (degenerative disc disease), lumbar   . Atypical chest pain     Question of coronary vasospasm; cath 10/20/10 NORMAL  . Anxiety   . Fibromyalgia     Dr. Hope Pigeon [phone]  . Interstitial cystitis   . Obesity   . Insomnia   . Transaminasemia 03/2010    (ALT 49), normal on f/u testing off of daily tylenol uuse.  Marland Kitchen GERD (gastroesophageal reflux disease) 06/01/10    Dyspepsia/gastritis, EGD 05/2010: gastritis (NSAIDS)  . Rectal bleeding 06/01/10    Anorectal bleeding/BRBPR--colonoscopy: internal hemorrhoids  . Recurrent UTI     renal u/s normal 07/2007  . Weight loss     CT abd/pelf 08/22/10 remakable only for bile duct 9mm (unchanged from 03/2004 PRIOR to ERCP w/spincterotomy, bile duct balloon dredging and lap chole)  . Recurrent fever     W/u unrevealing, including ID consult.    . Asthma   . Osteopenia     bone densitometry 2006 per pt  . 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 Christella Hartigan  . Multinodular goiter 11/2010   TSH normal  . Chest pain   . Rectal prolapse 02/2011    repaired lap [id number]  . Vitamin d deficiency 08/20/2011  . History of migraine headaches     Dr. Anne Hahn at [facility] 03/2012: started nortriptyline 10mg --titrating to 3 caps qhs  . Edema 10/31/2011  . Lobar pneumonia mid May, 2013    RML and RLL, with sepsis--admission to [facility] 12/2011--responded extremely well to rocephin + zithromax.  . IBS (irritable bowel syndrome)   . De Quervain's tenosynovitis, left 11/11/2011    MEDS:  Outpatient Prescriptions Prior to Visit  Medication Sig Dispense Refill  . albuterol (PROAIR HFA) 108 (90 BASE) MCG/ACT inhaler Inhale 2 puffs into the lungs every 4 (four) hours as needed. Shortness of breath  1 Inhaler  1  . amLODipine (NORVASC) 5 MG tablet Take 1 tablet (5 mg total) by mouth daily.  90 tablet  1  . Calcium Carbonate (CALCIUM 500 PO) Take 1 tablet by mouth daily.       . carisoprodol (SOMA) 250 MG tablet Take 1 tablet (250 mg total) by mouth 3 (three) times daily.  270 tablet  0  . clonazePAM (KLONOPIN) 1 MG tablet 1 tab po bid prn  180 tablet  0  . esomeprazole (NEXIUM) 40 MG capsule Take 1 capsule (40 mg total) by mouth 2 (two) times daily.  180 capsule  1  . Fish Oil OIL Take  2 capsules by mouth daily.       . Fluticasone-Salmeterol (ADVAIR DISKUS) 250-50 MCG/DOSE AEPB Inhale 1 puff into the lungs every 12 (twelve) hours.       . hyoscyamine (LEVSIN SL) 0.125 MG SL tablet Take 1-2 tablets (0.125-0.25 mg total) by mouth every 6 (six) hours as needed for cramping.  270 tablet  1  . MAGNESIUM PO Take 1 tablet by mouth daily. Unknown strength      . meloxicam (MOBIC) 7.5 MG tablet Take 1 tablet (7.5 mg total) by mouth daily.  90 tablet  1  . ondansetron (ZOFRAN-ODT) 8 MG disintegrating tablet Take 1 tablet (8 mg total) by mouth every 8 (eight) hours as needed. For nausea  60 tablet  1  . oxyCODONE (OXY IR/ROXICODONE) 5 MG immediate release tablet Take 1-2 tablets (5-10 mg total) by mouth every 4  (four) hours as needed.  300 tablet  0  . Oxycodone HCl 60 MG TB12 Take 1 tablet (60 mg total) by mouth 2 (two) times daily. Pain contract in chart  60 tablet  0  . phenazopyridine (PYRIDIUM) 200 MG tablet Take 1 tablet (200 mg total) by mouth 3 (three) times daily as needed for pain.  60 tablet  1  . polyethylene glycol powder (GLYCOLAX/MIRALAX) powder DISSOLVE 1 CAPFUL INTO LIQUID AND TAKE BY MOUTH ONE TO THREE TIMES DAILY AS NEEDED FOR CONSTIPATION  1581 g  3  . pramipexole (MIRAPEX) 0.125 MG tablet 3 tabs po qhs  270 tablet  1  . ranitidine (ZANTAC) 150 MG tablet Take 1 tablet (150 mg total) by mouth 2 (two) times daily.  180 tablet  1  . SUMAtriptan (IMITREX) 50 MG tablet Take 2 tablets (100 mg total) by mouth every 2 (two) hours as needed (migraine HA (max of 200mg /24h).  27 tablet  1  . zolpidem (AMBIEN) 10 MG tablet Take 1 tablet (10 mg total) by mouth at bedtime.  90 tablet  0  . sucralfate (CARAFATE) 1 G tablet 1 tab about 1 hour prior to each meal prn  90 tablet  1    PE: Blood pressure 124/78, pulse 67, temperature 98.2 F (36.8 C), temperature source Temporal, height 5' 4.5" (1.638 m), weight 155 lb (70.308 kg). Gen: Alert, well appearing.  Patient is oriented to person, place, time, and situation. AFFECT: slightly flat but she does smile at times and she did not cry any during today's encounter. Lucid thought and speech.  No further exam today.  IMPRESSION AND PLAN:  FIBROMYALGIA, SEVERE Problem stable.  Continue current medications and diet appropriate for this condition.  We have reviewed our general long term plan for this problem and also reviewed symptoms and signs that should prompt the patient to call or return to the office. RF'd oxycontin and oxycodone today.  Dysuria With leukocytes on UA.  Will send for c/s and hold off on antibiotics for now. She has hx of interstitial cystitis, continue pyridium (rx RF'd today).  Adjustment disorder With anxiety and depression:  fairly stable considering what's she's going through.  She declined offer of counseling or additional med at this time.  Chronic headaches Bothering her slightly worse in the past month, stress playing a large role in this. Continue current meds.     FOLLOW UP: 77mo

## 2012-04-27 ENCOUNTER — Other Ambulatory Visit: Payer: Self-pay | Admitting: Family Medicine

## 2012-04-27 MED ORDER — FLUCONAZOLE 150 MG PO TABS: ORAL_TABLET | ORAL | Status: DC

## 2012-04-27 MED ORDER — CIPROFLOXACIN HCL 500 MG PO TABS: 500.0000 mg | ORAL_TABLET | Freq: Two times a day (BID) | ORAL | Status: DC

## 2012-05-16 IMAGING — CT CT ABD-PELV W/ CM
1 of 3 series · 14 of 32 positions shown, 19 images · IV contrast (APPLIED)
Comparison: 08/22/2010

CLINICAL DATA: Right lower quadrant pain, fever

CT ABDOMEN AND PELVIS WITH CONTRAST
TECHNIQUE: Multidetector CT imaging of the abdomen and pelvis was
performed following the standard protocol during bolus
administration of intravenous contrast.
Contrast: 100 ml of Omnipaque

[Series 2: abd/pel with · axial · 0.80mm/px · z∈[+331,+751]mm · 14 of 94 slices shown, 19 images]
[im 5/94  soft-tissue]
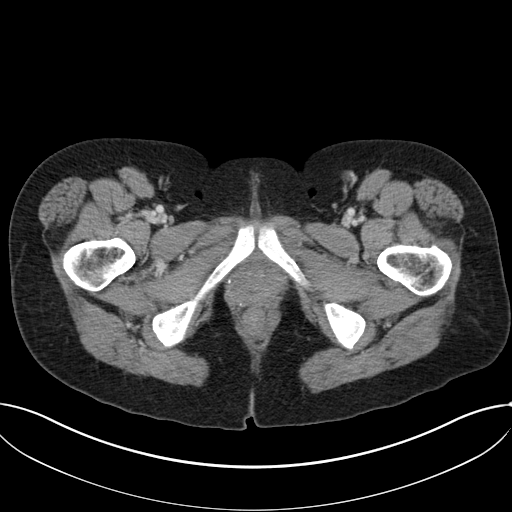
[im 5/94  bone]
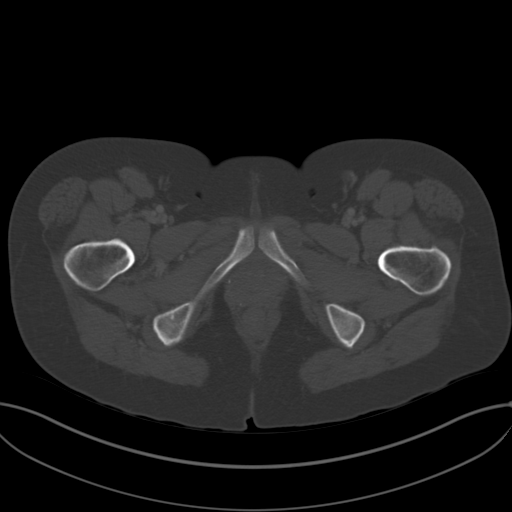
[im 14/94  soft-tissue]
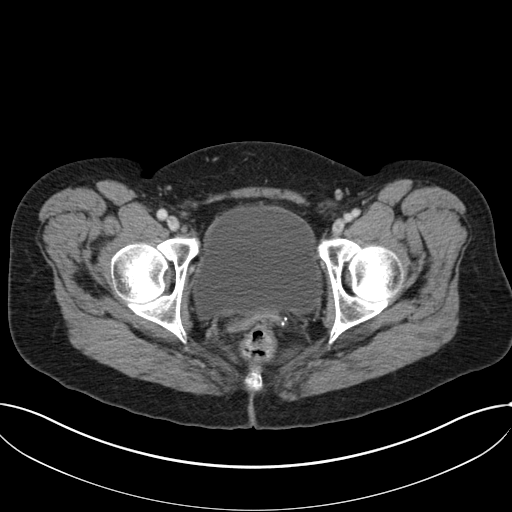
[im 19/94  soft-tissue]
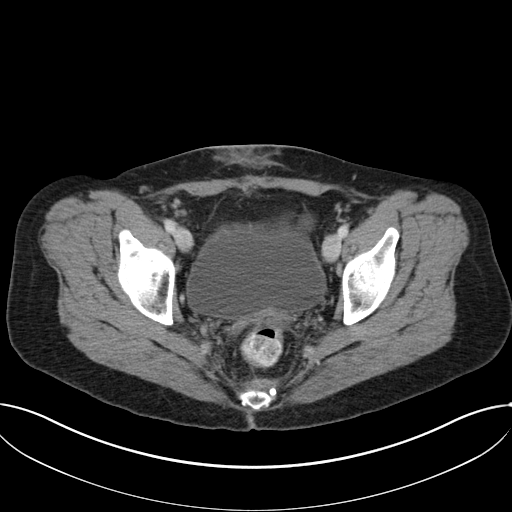
[im 28/94  soft-tissue]
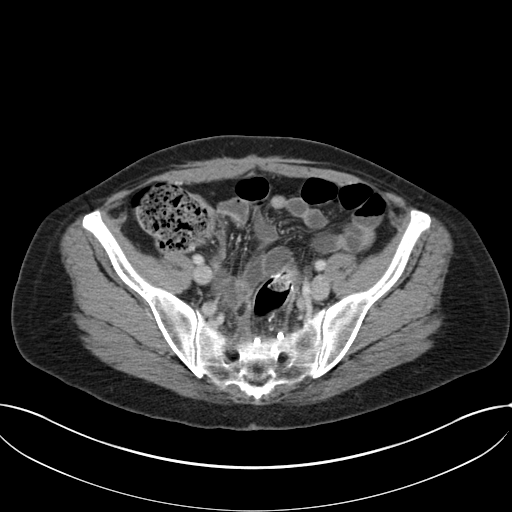
[im 33/94  soft-tissue]
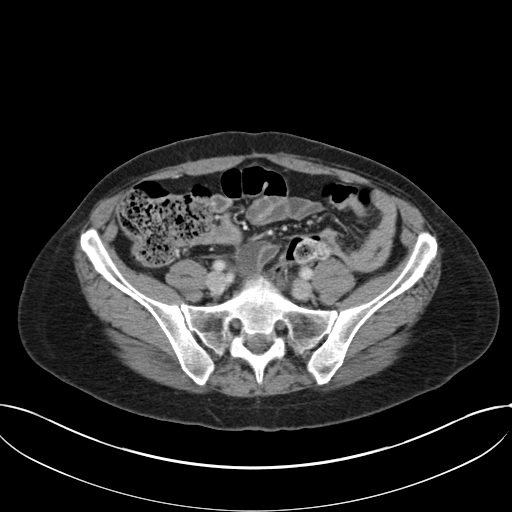
[im 42/94  soft-tissue]
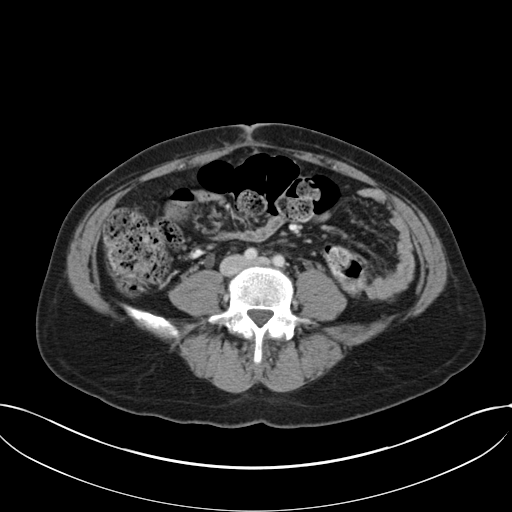
[im 47/94  soft-tissue]
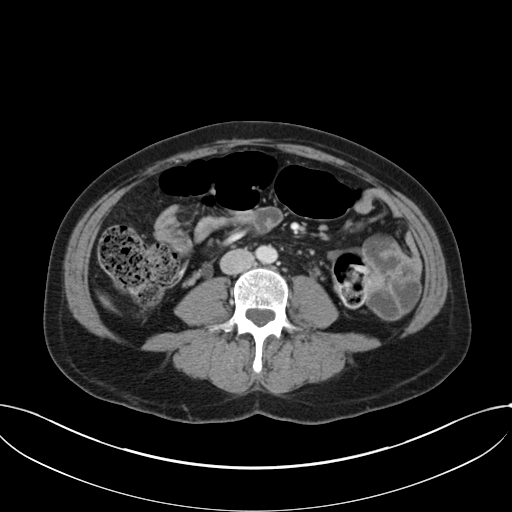
[im 52/94  soft-tissue]
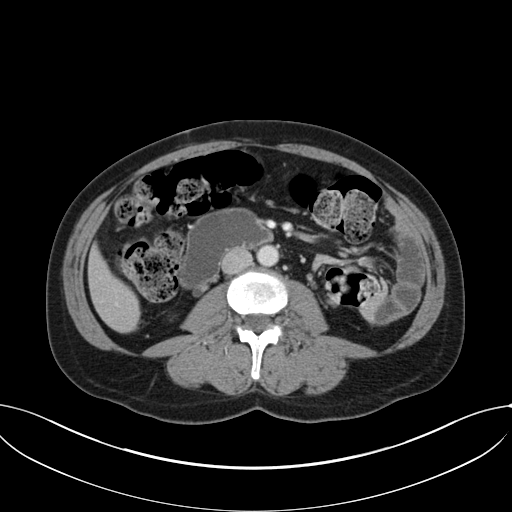
[im 61/94  soft-tissue]
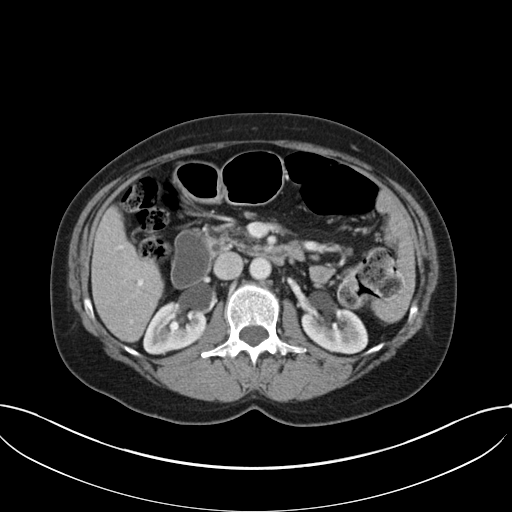
[im 61/94  bone]
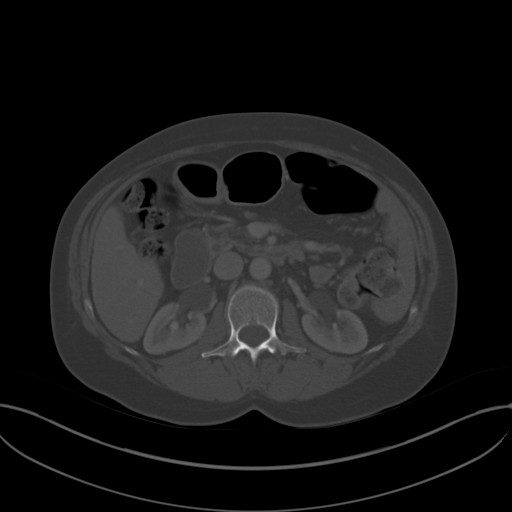
[im 66/94  soft-tissue]
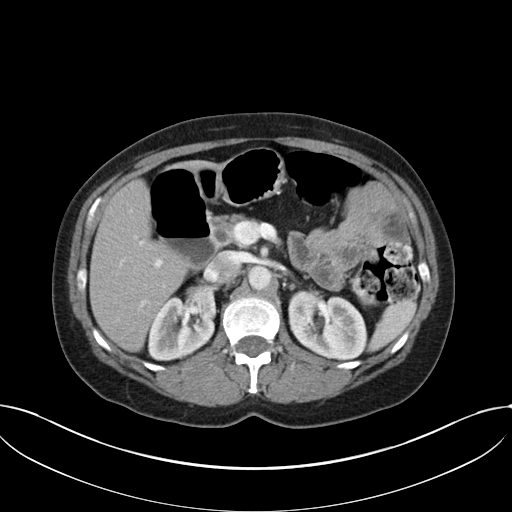
[im 75/94  soft-tissue]
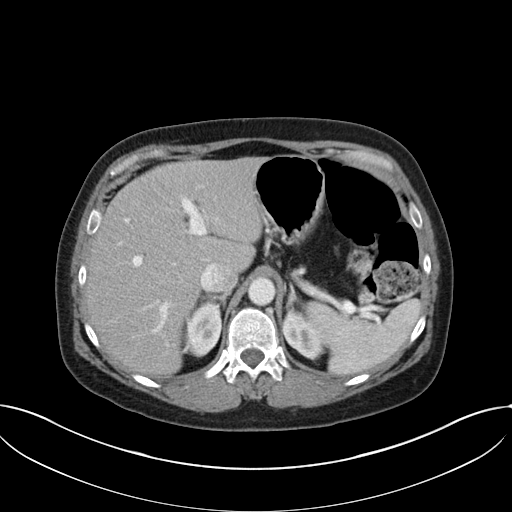
[im 75/94  lung]
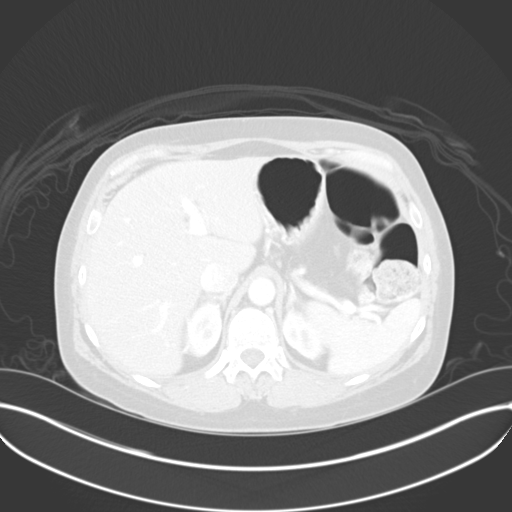
[im 80/94  soft-tissue]
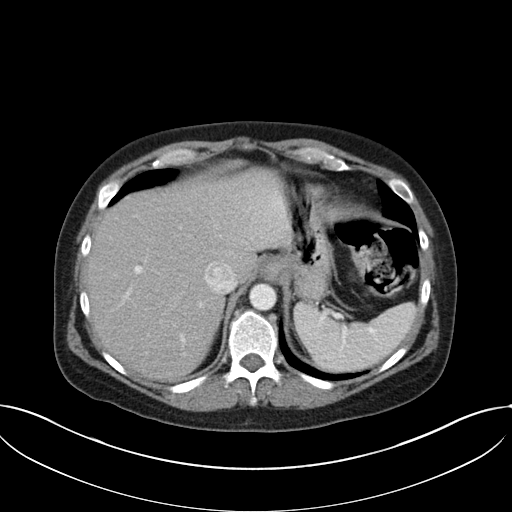
[im 80/94  lung]
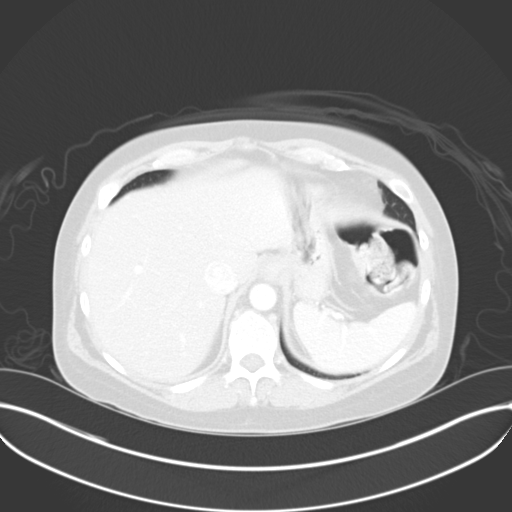
[im 84/94  lung]
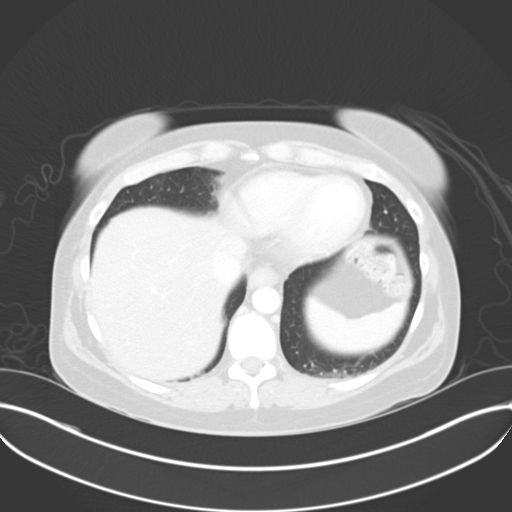
[im 89/94  soft-tissue]
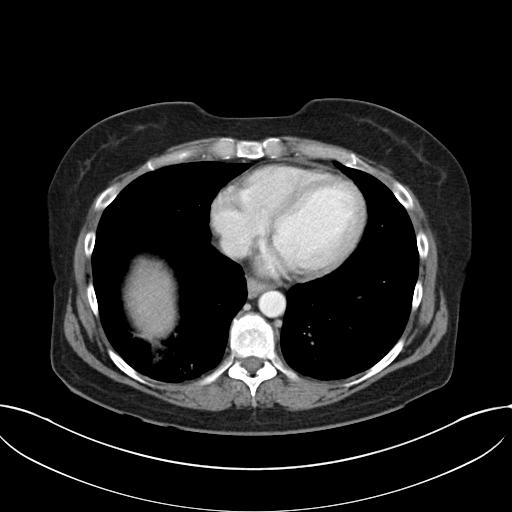
[im 89/94  lung]
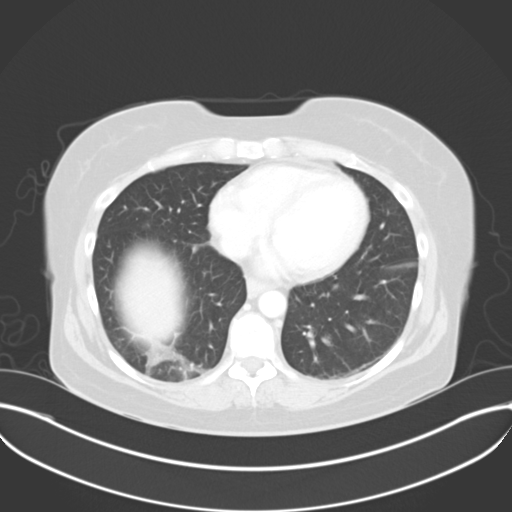

[14 of 32 positions shown; findings below may reference images not displayed]

FINDINGS: Lung bases are unremarkable.  The patient is status post
cholecystectomy.  Liver, spleen, pancreas and adrenal glands are
unremarkable.  Again noted somewhat atrophic pancreas.  Chronic
mild distention of the distal duodenum again noted without wall
thickening or surrounding inflammation.

Moderate stool and gas noted within colon.  Significant stool noted
within cecum.  There is no pericecal inflammation.  Enhanced
kidneys are symmetrical in size.  There is chronic mild prominent
extrarenal collecting system and proximal right ureter without
obstructive process identified.  Delayed renal images shows
bilateral renal symmetrical excretion.  No aortic aneurysm. No
hydronephrosis or hydroureter.

There is no small bowel obstruction.  No ascites or free air.  No
adenopathy.  Postsurgical changes are noted in the sigmoid colon.
No distal colonic obstruction.  No pelvic or abdominal fluid
collection.  The urinary bladder is unremarkable.

Sagittal images of the spine shows mild degenerative changes
thoracolumbar spine.  No destructive bony lesions are noted. The
uterus and adnexa are not identified.
IMPRESSION: 1.  No acute inflammatory process within abdomen or pelvis.  Status
post cholecystectomy.
2.  No hydronephrosis or hydroureter.
3.  Significant stool noted within cecum.  No pericecal
inflammation.
4.  Postsurgical changes sigmoid colon without colonic obstruction.

## 2012-05-22 ENCOUNTER — Encounter: Payer: Self-pay | Admitting: Family Medicine

## 2012-05-22 ENCOUNTER — Ambulatory Visit (INDEPENDENT_AMBULATORY_CARE_PROVIDER_SITE_OTHER): Payer: BC Managed Care – PPO | Admitting: Family Medicine

## 2012-05-22 VITALS — BP 112/74 | HR 88 | Ht 64.5 in | Wt 152.0 lb

## 2012-05-22 DIAGNOSIS — D509 Iron deficiency anemia, unspecified: Secondary | ICD-10-CM

## 2012-05-22 DIAGNOSIS — H04129 Dry eye syndrome of unspecified lacrimal gland: Secondary | ICD-10-CM

## 2012-05-22 DIAGNOSIS — E042 Nontoxic multinodular goiter: Secondary | ICD-10-CM

## 2012-05-22 DIAGNOSIS — F3289 Other specified depressive episodes: Secondary | ICD-10-CM

## 2012-05-22 DIAGNOSIS — A689 Relapsing fever, unspecified: Secondary | ICD-10-CM

## 2012-05-22 DIAGNOSIS — IMO0001 Reserved for inherently not codable concepts without codable children: Secondary | ICD-10-CM

## 2012-05-22 DIAGNOSIS — M797 Fibromyalgia: Secondary | ICD-10-CM

## 2012-05-22 DIAGNOSIS — F329 Major depressive disorder, single episode, unspecified: Secondary | ICD-10-CM

## 2012-05-22 LAB — COMPREHENSIVE METABOLIC PANEL
ALT: 11 U/L (ref 0–35)
AST: 15 U/L (ref 0–37)
CO2: 32 mEq/L (ref 19–32)
Chloride: 97 mEq/L (ref 96–112)
GFR: 76.04 mL/min (ref >60.00)
Sodium: 135 mEq/L (ref 135–145)
Total Bilirubin: 0.3 mg/dL (ref 0.3–1.2)
Total Protein: 7.5 g/dL (ref 6.0–8.3)

## 2012-05-22 LAB — CBC WITH DIFFERENTIAL/PLATELET
Basophils Absolute: 0 10*3/uL (ref 0.0–0.1)
Eosinophils Absolute: 0.1 10*3/uL (ref 0.0–0.7)
MCHC: 32.1 g/dL (ref 30.0–36.0)
MCV: 93 fl (ref 78.0–100.0)
Monocytes Absolute: 0.4 10*3/uL (ref 0.1–1.0)
Neutrophils Relative %: 73.2 % (ref 43.0–77.0)
Platelets: 199 10*3/uL (ref 150.0–400.0)
RDW: 14.7 % — ABNORMAL HIGH (ref 11.5–14.6)
WBC: 7.6 10*3/uL (ref 4.5–10.5)

## 2012-05-22 LAB — CK: Total CK: 49 U/L (ref 7–177)

## 2012-05-22 LAB — SEDIMENTATION RATE: Sed Rate: 7 mm/h (ref 0–22)

## 2012-05-22 LAB — T4, FREE: Free T4: 0.91 ng/dL (ref 0.60–1.60)

## 2012-05-22 LAB — T3: T3, Total: 83.6 ng/dL (ref 80.0–204.0)

## 2012-05-22 LAB — TSH: TSH: 2.2 u[IU]/mL (ref 0.35–5.50)

## 2012-05-22 MED ORDER — OXYCODONE HCL 5 MG PO TABS: 5.0000 mg | ORAL_TABLET | ORAL | Status: DC | PRN

## 2012-05-22 MED ORDER — OXYCODONE HCL ER 60 MG PO T12A: 1.0000 | EXTENDED_RELEASE_TABLET | Freq: Two times a day (BID) | ORAL | Status: DC

## 2012-05-22 NOTE — Progress Notes (Addendum)
OFFICE NOTE  05/22/2012  CC:  Chief Complaint  Patient presents with  . Follow-up    fibromyalgia     HPI: Patient is a 46 y.o. Caucasian female who is here for 1 month follow up for severe fibromyalgia syndrome/chronic pain med use.   Pain level 7-8/10 w/out meds.  Got left hip and left shoulder injected last week at Dr. Doroteo Glassman office. No new news from [facility] and I think she is finished going there. Overall, says migraines are increased lately (she said this last visit as well).  Has 2-4 per week, last 1/2 day up to 1 and 1/2 days.  Imitrex helps a little bit.  Ultimately she has to lie in dark room and wait it out. She has been using amlodipine only on prn basis (has had to use it twice for chest pain when stressed out) and bp and pulse have been normal. Has had some issues getting ambien approved, so has been using clonazepam tid some days.  Pertinent PMH:  Past Medical History  Diagnosis Date  . DDD (degenerative disc disease), lumbar   . Atypical chest pain     Question of coronary vasospasm; cath 10/20/10 NORMAL  . Anxiety   . Fibromyalgia     Dr. Hope Pigeon [phone]  . Interstitial cystitis   . Obesity   . Insomnia   . Transaminasemia 03/2010    (ALT 49), normal on f/u testing off of daily tylenol uuse.  Marland Kitchen GERD (gastroesophageal reflux disease) 06/01/10    Dyspepsia/gastritis, EGD 05/2010: gastritis (NSAIDS)  . Rectal bleeding 06/01/10    Anorectal bleeding/BRBPR--colonoscopy: internal hemorrhoids  . Recurrent UTI     renal u/s normal 07/2007  . Weight loss     CT abd/pelf 08/22/10 remakable only for bile duct 9mm (unchanged from 03/2004 PRIOR to ERCP w/spincterotomy, bile duct balloon dredging and lap chole)  . Recurrent fever     W/u unrevealing, including ID consult.    . Asthma   . Osteopenia     bone densitometry 2006 per pt  . 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 Christella Hartigan  . Multinodular goiter 11/2010    TSH normal  .  Chest pain   . Rectal prolapse 02/2011    repaired lap [id number]  . Vitamin D deficiency 08/20/2011  . History of migraine headaches     Dr. Anne Hahn at [facility] 03/2012: started nortriptyline 10mg --titrating to 3 caps qhs  . Edema 10/31/2011  . Lobar pneumonia mid May, 2013    RML and RLL, with sepsis--admission to [facility] 12/2011--responded extremely well to rocephin + zithromax.  . IBS (irritable bowel syndrome)   . De Quervain's tenosynovitis, left 11/11/2011    MEDS:  Outpatient Prescriptions Prior to Visit  Medication Sig Dispense Refill  . albuterol (PROAIR HFA) 108 (90 BASE) MCG/ACT inhaler Inhale 2 puffs into the lungs every 4 (four) hours as needed. Shortness of breath  1 Inhaler  1  . amLODipine (NORVASC) 5 MG tablet Take 1 tablet (5 mg total) by mouth daily.  90 tablet  1  . Calcium Carbonate (CALCIUM 500 PO) Take 1 tablet by mouth daily.       . carisoprodol (SOMA) 250 MG tablet Take 1 tablet (250 mg total) by mouth 3 (three) times daily.  270 tablet  0  . clonazePAM (KLONOPIN) 1 MG tablet 1 tab po bid prn  180 tablet  0  . Fish Oil OIL Take 2 capsules by mouth daily.       Marland Kitchen  fluconazole (DIFLUCAN) 150 MG tablet 1 tab po qd x 3d  3 tablet  1  . Fluticasone-Salmeterol (ADVAIR DISKUS) 250-50 MCG/DOSE AEPB Inhale 1 puff into the lungs every 12 (twelve) hours.       . hyoscyamine (LEVSIN SL) 0.125 MG SL tablet Take 1-2 tablets (0.125-0.25 mg total) by mouth every 6 (six) hours as needed for cramping.  270 tablet  1  . MAGNESIUM PO Take 1 tablet by mouth daily. Unknown strength      . meloxicam (MOBIC) 7.5 MG tablet Take 1 tablet (7.5 mg total) by mouth daily.  90 tablet  1  . ondansetron (ZOFRAN-ODT) 8 MG disintegrating tablet Take 1 tablet (8 mg total) by mouth every 8 (eight) hours as needed. For nausea  60 tablet  1  . oxyCODONE (OXY IR/ROXICODONE) 5 MG immediate release tablet Take 1-2 tablets (5-10 mg total) by mouth every 4 (four) hours as needed.  300 tablet  0  . Oxycodone HCl 60 MG  TB12 Take 1 tablet (60 mg total) by mouth 2 (two) times daily. Pain contract in chart  60 tablet  0  . phenazopyridine (PYRIDIUM) 200 MG tablet Take 1 tablet (200 mg total) by mouth 3 (three) times daily as needed for pain.  90 tablet  2  . polyethylene glycol powder (GLYCOLAX/MIRALAX) powder DISSOLVE 1 CAPFUL INTO LIQUID AND TAKE BY MOUTH ONE TO THREE TIMES DAILY AS NEEDED FOR CONSTIPATION  1581 g  3  . pramipexole (MIRAPEX) 0.125 MG tablet 3 tabs po qhs  270 tablet  1  . ranitidine (ZANTAC) 150 MG tablet Take 1 tablet (150 mg total) by mouth 2 (two) times daily.  180 tablet  1  . sucralfate (CARAFATE) 1 G tablet 1 tab about 1 hour prior to each meal prn      . SUMAtriptan (IMITREX) 50 MG tablet Take 2 tablets (100 mg total) by mouth every 2 (two) hours as needed (migraine HA (max of 200mg /24h).  27 tablet  1  . zolpidem (AMBIEN) 10 MG tablet Take 1 tablet (10 mg total) by mouth at bedtime.  90 tablet  0  . ciprofloxacin (CIPRO) 500 MG tablet Take 1 tablet (500 mg total) by mouth 2 (two) times daily.  14 tablet  0    PE: Blood pressure 112/74, pulse 88, height 5' 4.5" (1.638 m), weight 152 lb (68.947 kg). Gen: Alert, well appearing.  Patient is oriented to person, place, time, and situation.  IMPRESSION AND PLAN:  DEPRESSION Recurrent episode due to situation with her husband (divorce). She is managing ok at this time, declines trial of antidepressant.  FIBROMYALGIA, SEVERE Problem stable.  Continue current medications and diet appropriate for this condition.  We have reviewed our general long term plan for this problem and also reviewed symptoms and signs that should prompt the patient to call or return to the office. I don't think she is a candidate for a job that requires prolonged sitting, prolonged standing, lifting >15 pounds repetitively, or significant periods of concentration or decision making. RF'd pain meds today.  Will do random drug screen next f/u visit in 1 mo.  FEVER,  RECURRENT Only one episode since last visit 1 mo ago. Extensive w/u neg.  Will check immunoglobulins: IgA, IgM, and IgG, plus IgG subtype levels and C3 and C4 levels.   Spent 25 min with pt today, with >50% of this time spent in counseling and care coordination regarding the above problems.  An After Visit Summary was printed  and given to the patient.   FOLLOW UP: 1 mo

## 2012-05-23 LAB — IGG, IGA, IGM
IgA: 281 mg/dL (ref 69–380)
IgG (Immunoglobin G), Serum: 1170 mg/dL (ref 690–1700)
IgM, Serum: 235 mg/dL (ref 52–322)

## 2012-05-23 LAB — IGG 1, 2, 3, AND 4
IgG Subclass 1: 818 mg/dL (ref 405.0–1011.0)
IgG Subclass 2: 375 mg/dL (ref 169.0–786.0)
IgG Subclass 3: 45.7 mg/dL (ref 11.0–85.0)
IgG Subclass 4: 56 mg/dL (ref 3.0–201.0)
IgG Total IGGSUB: 1170 mg/dL (ref 690–1700)

## 2012-05-23 LAB — ANA: Anti Nuclear Antibody(ANA): POSITIVE — AB

## 2012-05-26 NOTE — Assessment & Plan Note (Signed)
Recurrent episode due to situation with her husband (divorce). She is managing ok at this time, declines trial of antidepressant.

## 2012-05-26 NOTE — Assessment & Plan Note (Signed)
Only one episode since last visit 1 mo ago. Extensive w/u neg.  Will check immunoglobulins: IgA, IgM, and IgG, plus IgG subtype levels and C3 and C4 levels.

## 2012-05-26 NOTE — Assessment & Plan Note (Addendum)
Problem stable.  Continue current medications and diet appropriate for this condition.  We have reviewed our general long term plan for this problem and also reviewed symptoms and signs that should prompt the patient to call or return to the office. I don't think she is a candidate for a job that requires prolonged sitting, prolonged standing, lifting >15 pounds repetitively, or significant periods of concentration or decision making. RF'd pain meds today.  Will do random drug screen next f/u visit in 1 mo.

## 2012-06-03 ENCOUNTER — Encounter: Payer: Self-pay | Admitting: *Deleted

## 2012-06-11 ENCOUNTER — Telehealth: Payer: Self-pay | Admitting: Family Medicine

## 2012-06-11 NOTE — Telephone Encounter (Signed)
Pls notify pt that she has a false positive ANA, meaning her ANA comes back positive but her TITER comes back negative: a negative titer means there is no ANA (antinuclear antibody) present.  Reassure her that I have even confirmed this with the lab when it first came back this way. She is welcome to come in for an acute office visit if she thinks she has mono.--thx

## 2012-06-11 NOTE — Telephone Encounter (Signed)
forward

## 2012-06-11 NOTE — Telephone Encounter (Signed)
Patient calling.  Would like to ask about her + ANA results.  Has not received any orders or call from the office.  Has had sore throat and fatigue, h/a since 2011.   She is questioning the ANA results and what she has seen when she googled the test results.  Also questioning her Platelet count from 302K t0 199K over the year.   She feels that she has Lupus.  Would like to be tested for Mono.  Has a f/u appt. already scheduled.

## 2012-06-12 NOTE — Telephone Encounter (Signed)
Notified of recommendations. Pt states she has been feeling bad for 2.5 weeks.  She declines office visit to eval for mono.  She will discuss symptoms and + ANA at next visit.

## 2012-06-19 ENCOUNTER — Ambulatory Visit: Payer: BC Managed Care – PPO | Admitting: Family Medicine

## 2012-06-20 ENCOUNTER — Ambulatory Visit (INDEPENDENT_AMBULATORY_CARE_PROVIDER_SITE_OTHER): Payer: BC Managed Care – PPO | Admitting: Family Medicine

## 2012-06-20 ENCOUNTER — Encounter: Payer: Self-pay | Admitting: Family Medicine

## 2012-06-20 ENCOUNTER — Ambulatory Visit: Payer: BC Managed Care – PPO | Admitting: Family Medicine

## 2012-06-20 ENCOUNTER — Other Ambulatory Visit: Payer: Self-pay | Admitting: Family Medicine

## 2012-06-20 VITALS — BP 96/60 | HR 111 | Temp 98.8°F | Ht 64.5 in | Wt 162.0 lb

## 2012-06-20 DIAGNOSIS — J029 Acute pharyngitis, unspecified: Secondary | ICD-10-CM

## 2012-06-20 DIAGNOSIS — E042 Nontoxic multinodular goiter: Secondary | ICD-10-CM

## 2012-06-20 DIAGNOSIS — A689 Relapsing fever, unspecified: Secondary | ICD-10-CM

## 2012-06-20 DIAGNOSIS — J069 Acute upper respiratory infection, unspecified: Secondary | ICD-10-CM

## 2012-06-20 DIAGNOSIS — IMO0001 Reserved for inherently not codable concepts without codable children: Secondary | ICD-10-CM

## 2012-06-20 DIAGNOSIS — M797 Fibromyalgia: Secondary | ICD-10-CM

## 2012-06-20 DIAGNOSIS — H04129 Dry eye syndrome of unspecified lacrimal gland: Secondary | ICD-10-CM

## 2012-06-20 DIAGNOSIS — F329 Major depressive disorder, single episode, unspecified: Secondary | ICD-10-CM

## 2012-06-20 DIAGNOSIS — F3289 Other specified depressive episodes: Secondary | ICD-10-CM

## 2012-06-20 MED ORDER — OXYCODONE HCL ER 60 MG PO T12A: 1.0000 | EXTENDED_RELEASE_TABLET | Freq: Two times a day (BID) | ORAL | Status: DC

## 2012-06-20 MED ORDER — CLONAZEPAM 1 MG PO TABS: ORAL_TABLET | ORAL | Status: DC

## 2012-06-20 MED ORDER — ZOLPIDEM TARTRATE 10 MG PO TABS: 10.0000 mg | ORAL_TABLET | Freq: Every day | ORAL | Status: DC

## 2012-06-20 MED ORDER — OXYCODONE HCL 5 MG PO TABS: 5.0000 mg | ORAL_TABLET | ORAL | Status: DC | PRN

## 2012-06-20 NOTE — Progress Notes (Signed)
OFFICE NOTE  06/20/2012  CC:  Chief Complaint  Patient presents with  . Follow-up    fibromyalgia     HPI: Patient is a 46 y.o. Caucasian female who is here for 1 mo f/u severe fibromyalgia, hx of unexplained recurrent fevers, hx of depression.  Lab work last visit was all normal.   Last 3 wks has had more fatigue, also ST and upper resp congestion and more migraines.  No significant fever.   Regarding her fibromyalgia pain, she still characterizes it as severe, brought to tolerable level with pain meds, ice/heat, does light housework at 15 minute spurts.  She has found the compounded topical anti-inflammitory cream I rx'd last visit to be helpful when placed on upper traps, shoulders, neck soft tissues.   She laments still about her marital situation, says her husband now is open to reconciliation but seems to express no remorse for having an extramarital affair and per [facility] report he is not open to marriage counseling.  Bradley denies that she is in need of antidepressant, declines counseling referral.  No SI or HI.  Pertinent PMH:  Past Medical History  Diagnosis Date  . DDD (degenerative disc disease), lumbar   . Atypical chest pain     Question of coronary vasospasm; cath 10/20/10 NORMAL  . Anxiety   . Fibromyalgia     Dr. Hope Pigeon [phone]  . Interstitial cystitis   . Obesity   . Insomnia   . Transaminasemia 03/2010    (ALT 49), normal on f/u testing off of daily tylenol uuse.  Marland Kitchen GERD (gastroesophageal reflux disease) 06/01/10    Dyspepsia/gastritis, EGD 05/2010: gastritis (NSAIDS)  . Rectal bleeding 06/01/10    Anorectal bleeding/BRBPR--colonoscopy: internal hemorrhoids  . Recurrent UTI     renal u/s normal 07/2007  . Weight loss     CT abd/pelf 08/22/10 remakable only for bile duct 9mm (unchanged from 03/2004 PRIOR to ERCP w/spincterotomy, bile duct balloon dredging and lap chole)  . Recurrent fever     W/u unrevealing, including ID consult.    . Asthma   .  Osteopenia     bone densitometry 2006 per pt  . 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 Christella Hartigan  . Multinodular goiter 11/2010    TSH normal  . Chest pain   . Rectal prolapse 02/2011    repaired lap [id number]  . Vitamin D deficiency 08/20/2011  . History of migraine headaches     Dr. Anne Hahn at [facility] 03/2012: started nortriptyline 10mg --titrating to 3 caps qhs  . Edema 10/31/2011  . Lobar pneumonia mid May, 2013    RML and RLL, with sepsis--admission to [facility] 12/2011--responded extremely well to rocephin + zithromax.  . IBS (irritable bowel syndrome)   . De Quervain's tenosynovitis, left 11/11/2011    MEDS:  Outpatient Prescriptions Prior to Visit  Medication Sig Dispense Refill  . albuterol (PROAIR HFA) 108 (90 BASE) MCG/ACT inhaler Inhale 2 puffs into the lungs every 4 (four) hours as needed. Shortness of breath  1 Inhaler  1  . amLODipine (NORVASC) 5 MG tablet Take 1 tablet (5 mg total) by mouth daily.  90 tablet  1  . Calcium Carbonate (CALCIUM 500 PO) Take 1 tablet by mouth daily.       . carisoprodol (SOMA) 250 MG tablet Take 1 tablet (250 mg total) by mouth 3 (three) times daily.  270 tablet  0  . clonazePAM (KLONOPIN) 1 MG tablet 1 tab po bid prn  180 tablet  0  . Fish Oil OIL Take 2 capsules by mouth daily.       . Fluticasone-Salmeterol (ADVAIR DISKUS) 250-50 MCG/DOSE AEPB Inhale 1 puff into the lungs every 12 (twelve) hours.       . hyoscyamine (LEVSIN SL) 0.125 MG SL tablet Take 1-2 tablets (0.125-0.25 mg total) by mouth every 6 (six) hours as needed for cramping.  270 tablet  1  . MAGNESIUM PO Take 1 tablet by mouth daily. Unknown strength      . meloxicam (MOBIC) 7.5 MG tablet Take 1 tablet (7.5 mg total) by mouth daily.  90 tablet  1  . ondansetron (ZOFRAN-ODT) 8 MG disintegrating tablet Take 1 tablet (8 mg total) by mouth every 8 (eight) hours as needed. For nausea  60 tablet  1  . oxyCODONE (OXY IR/ROXICODONE) 5 MG immediate release tablet Take  1-2 tablets (5-10 mg total) by mouth every 4 (four) hours as needed.  300 tablet  0  . OxyCODONE HCl ER (OXYCONTIN) 60 MG T12A Take 1 tablet by mouth 2 (two) times daily.  60 each  0  . phenazopyridine (PYRIDIUM) 200 MG tablet Take 1 tablet (200 mg total) by mouth 3 (three) times daily as needed for pain.  90 tablet  2  . polyethylene glycol powder (GLYCOLAX/MIRALAX) powder DISSOLVE 1 CAPFUL INTO LIQUID AND TAKE BY MOUTH ONE TO THREE TIMES DAILY AS NEEDED FOR CONSTIPATION  1581 g  3  . pramipexole (MIRAPEX) 0.125 MG tablet 3 tabs po qhs  270 tablet  1  . ranitidine (ZANTAC) 150 MG tablet Take 1 tablet (150 mg total) by mouth 2 (two) times daily.  180 tablet  1  . sucralfate (CARAFATE) 1 G tablet 1 tab about 1 hour prior to each meal prn      . SUMAtriptan (IMITREX) 50 MG tablet Take 2 tablets (100 mg total) by mouth every 2 (two) hours as needed (migraine HA (max of 200mg /24h).  27 tablet  1  . zolpidem (AMBIEN) 10 MG tablet Take 1 tablet (10 mg total) by mouth at bedtime.  90 tablet  0    PE: Blood pressure 96/60, pulse 111, temperature 98.8 F (37.1 C), temperature source Temporal, height 5' 4.5" (1.638 m), weight 162 lb (73.483 kg). Gen: Alert, well appearing.  Patient is oriented to person, place, time, and situation. No facial rash.  No icteris or scleral injection. Nose with mild mucosal injection/edema.  No active drainage.  No facial tenderness or swelling.  TMs normal. Neck: no TM or LAD. CV: RRR, no m/r/g.   LUNGS: CTA bilat, nonlabored resps, good aeration in all lung fields. ABD: soft, diffuse mild TTP all over abdomen without guarding or rebound, ND, BS normal.  No hepatospenomegaly or mass.  No bruits. EXT: no clubbing, cyanosis, or edema.  She has tenderness to palpation over every soft tissue/muscle group from head to toe.  SKIN: no rash.  Rapid strep today: negative  IMPRESSION AND PLAN:  FIBROMYALGIA, SEVERE Poorly controlled but no acute change.  She is unable to work.    Continue current meds.   We'll do random UDS today.  Last dose of clonazepam was last night and last does of her oxycontin and oxycodone were this morning.  FEVER, RECURRENT Extensive w/u unrevealing.  These episodes are becoming less and less frequent.  Viral URI Prolonged but now resolving appropriately. Send group A strep probe. Discussed ongoing symptomatic care.  DEPRESSION Stable+grief rxn improving.    She declined  flu vaccine today.  An After Visit Summary was printed and given to the patient.  FOLLOW UP: 1 mo

## 2012-06-21 LAB — PRESCRIPTION ABUSE MONITORING 10P, URINE
Amphetamine/Meth: NEGATIVE ng/mL
Barbiturate Screen, Urine: NEGATIVE ng/mL
Buprenorphine, Urine: NEGATIVE ng/mL
Cannabinoid Scrn, Ur: NEGATIVE ng/mL
Cocaine Metabolites: NEGATIVE ng/mL
Creatinine, Urine: 110.71 mg/dL (ref >20.0)
Methadone Screen, Urine: NEGATIVE ng/mL
Propoxyphene: NEGATIVE ng/mL

## 2012-06-22 LAB — CULTURE, GROUP A STREP

## 2012-06-24 LAB — OPIATES/OPIOIDS (LC/MS-MS)
Codeine Urine: NEGATIVE ng/mL
Heroin (6-AM), UR: NEGATIVE ng/mL
Hydrocodone: NEGATIVE ng/mL
Noroxycodone, Ur: >10000 ng/mL
Oxymorphone: 1358 ng/mL

## 2012-06-24 LAB — BENZODIAZEPINES (GC/LC/MS), URINE
Clonazepam metabolite (GC/LC/MS), ur confirm: 556 ng/mL
Flunitrazepam metabolite (GC/LC/MS), ur confirm: NEGATIVE ng/mL
Lorazepam (GC/LC/MS), ur confirm: NEGATIVE ng/mL
Nordiazepam (GC/LC/MS), ur confirm: NEGATIVE ng/mL
Temazepam (GC/LC/MS), ur confirm: NEGATIVE ng/mL

## 2012-06-24 LAB — OXYCODONE, URINE (LC/MS-MS): Oxymorphone: 1358 ng/mL

## 2012-06-30 NOTE — Assessment & Plan Note (Signed)
Prolonged but now resolving appropriately. Send group A strep probe. Discussed ongoing symptomatic care.

## 2012-06-30 NOTE — Assessment & Plan Note (Signed)
Extensive w/u unrevealing.  These episodes are becoming less and less frequent.

## 2012-06-30 NOTE — Assessment & Plan Note (Signed)
Stable+grief rxn improving.

## 2012-06-30 NOTE — Assessment & Plan Note (Signed)
Poorly controlled but no acute change.  She is unable to work.   Continue current meds.   We'll do random UDS today.  Last dose of clonazepam was last night and last does of her oxycontin and oxycodone were this morning.

## 2012-07-01 ENCOUNTER — Encounter: Payer: Self-pay | Admitting: Family Medicine

## 2012-07-01 DIAGNOSIS — Z79899 Other long term (current) drug therapy: Secondary | ICD-10-CM | POA: Insufficient documentation

## 2012-07-16 ENCOUNTER — Other Ambulatory Visit: Payer: Self-pay | Admitting: *Deleted

## 2012-07-16 MED ORDER — CARISOPRODOL 250 MG PO TABS: 250.0000 mg | ORAL_TABLET | Freq: Three times a day (TID) | ORAL | Status: DC

## 2012-07-16 NOTE — Telephone Encounter (Signed)
Refill request for SOMA Last filled- 04/10/12, #[phone] X 0 (takes TID) Last seen- 06/20/12 Follow up - 07/18/12 Please advise.

## 2012-07-16 NOTE — Telephone Encounter (Signed)
RX faxed

## 2012-07-18 ENCOUNTER — Ambulatory Visit: Payer: BC Managed Care – PPO | Admitting: Family Medicine

## 2012-07-21 ENCOUNTER — Ambulatory Visit (INDEPENDENT_AMBULATORY_CARE_PROVIDER_SITE_OTHER): Payer: BC Managed Care – PPO | Admitting: Family Medicine

## 2012-07-21 ENCOUNTER — Encounter: Payer: Self-pay | Admitting: Family Medicine

## 2012-07-21 VITALS — BP 132/83 | HR 113 | Temp 99.0°F | Ht 64.5 in | Wt 166.0 lb

## 2012-07-21 DIAGNOSIS — R071 Chest pain on breathing: Secondary | ICD-10-CM

## 2012-07-21 DIAGNOSIS — R0789 Other chest pain: Secondary | ICD-10-CM

## 2012-07-21 DIAGNOSIS — J45909 Unspecified asthma, uncomplicated: Secondary | ICD-10-CM

## 2012-07-21 DIAGNOSIS — J4540 Moderate persistent asthma, uncomplicated: Secondary | ICD-10-CM

## 2012-07-21 DIAGNOSIS — H04129 Dry eye syndrome of unspecified lacrimal gland: Secondary | ICD-10-CM

## 2012-07-21 DIAGNOSIS — E042 Nontoxic multinodular goiter: Secondary | ICD-10-CM

## 2012-07-21 DIAGNOSIS — R110 Nausea: Secondary | ICD-10-CM

## 2012-07-21 DIAGNOSIS — R5381 Other malaise: Secondary | ICD-10-CM

## 2012-07-21 DIAGNOSIS — F4320 Adjustment disorder, unspecified: Secondary | ICD-10-CM

## 2012-07-21 DIAGNOSIS — IMO0001 Reserved for inherently not codable concepts without codable children: Secondary | ICD-10-CM

## 2012-07-21 DIAGNOSIS — D509 Iron deficiency anemia, unspecified: Secondary | ICD-10-CM

## 2012-07-21 DIAGNOSIS — A689 Relapsing fever, unspecified: Secondary | ICD-10-CM

## 2012-07-21 DIAGNOSIS — J019 Acute sinusitis, unspecified: Secondary | ICD-10-CM

## 2012-07-21 DIAGNOSIS — M797 Fibromyalgia: Secondary | ICD-10-CM

## 2012-07-21 LAB — COMPREHENSIVE METABOLIC PANEL
Albumin: 4.2 g/dL (ref 3.5–5.2)
Alkaline Phosphatase: 68 U/L (ref 39–117)
CO2: 31 mEq/L (ref 19–32)
GFR: 95.07 mL/min (ref >60.00)
Glucose, Bld: 112 mg/dL — ABNORMAL HIGH (ref 70–99)
Potassium: 4.4 mEq/L (ref 3.5–5.1)
Sodium: 132 mEq/L — ABNORMAL LOW (ref 135–145)
Total Protein: 7.5 g/dL (ref 6.0–8.3)

## 2012-07-21 LAB — CBC WITH DIFFERENTIAL/PLATELET
Basophils Relative: 0.1 % (ref 0.0–3.0)
Eosinophils Relative: 2.1 % (ref 0.0–5.0)
Lymphocytes Relative: 9.7 % — ABNORMAL LOW (ref 12.0–46.0)
Neutrophils Relative %: 83.2 % — ABNORMAL HIGH (ref 43.0–77.0)
RBC: 4.2 Mil/uL (ref 3.87–5.11)
WBC: 7.1 10*3/uL (ref 4.5–10.5)

## 2012-07-21 LAB — SEDIMENTATION RATE: Sed Rate: 18 mm/hr (ref 0–22)

## 2012-07-21 MED ORDER — OXYCODONE HCL 5 MG PO TABS: 5.0000 mg | ORAL_TABLET | ORAL | Status: DC | PRN

## 2012-07-21 MED ORDER — ONDANSETRON 8 MG PO TBDP: 8.0000 mg | ORAL_TABLET | Freq: Three times a day (TID) | ORAL | Status: DC | PRN

## 2012-07-21 MED ORDER — OXYCODONE HCL ER 60 MG PO T12A: 1.0000 | EXTENDED_RELEASE_TABLET | Freq: Two times a day (BID) | ORAL | Status: DC

## 2012-07-21 MED ORDER — SUMATRIPTAN SUCCINATE 50 MG PO TABS: 100.0000 mg | ORAL_TABLET | ORAL | Status: AC | PRN

## 2012-07-21 MED ORDER — AMOXICILLIN 875 MG PO TABS: 875.0000 mg | ORAL_TABLET | Freq: Two times a day (BID) | ORAL | Status: AC

## 2012-07-21 NOTE — Progress Notes (Signed)
OFFICE NOTE  07/21/2012  CC:  Chief Complaint  Patient presents with  . Follow-up    fibromyalgia     HPI: Patient is a 46 y.o. Caucasian female who is here for routine 1 mo f/u for fibromyalgia.  UDS last f/u visit was appropriately positive for benzos and opioids.  About 6 d/a she leaned over her daughter's dresser to look for something on the floor and it started to hurt in right upper chest and clavicle a few days later, describes sharp pain 7/10 intensity.  Really congested "again", describes double sickening.  No cough, just upper resp sx's.  HA's/migraines really bad--as per her usual description.  Stiff and sore in hands and knees lately.   When asked about fever she says "low grade" " but nothing consistent.   Describes small little bumpy rash on back, no itching, no new contact irritants or allergens, stayed a couple of weeks, nothing applied to it.   Fibromyalgia and migraines are unchanged.  Pertinent PMH:  Past Medical History  Diagnosis Date  . DDD (degenerative disc disease), lumbar   . Atypical chest pain     Question of coronary vasospasm; cath 10/20/10 NORMAL  . Anxiety   . Fibromyalgia     Dr. Hope Pigeon [phone]  . Interstitial cystitis   . Obesity   . Insomnia   . Transaminasemia 03/2010    (ALT 49), normal on f/u testing off of daily tylenol uuse.  Marland Kitchen GERD (gastroesophageal reflux disease) 06/01/10    Dyspepsia/gastritis, EGD 05/2010: gastritis (NSAIDS)  . Rectal bleeding 06/01/10    Anorectal bleeding/BRBPR--colonoscopy: internal hemorrhoids  . Recurrent UTI     renal u/s normal 07/2007  . Weight loss     CT abd/pelf 08/22/10 remakable only for bile duct 9mm (unchanged from 03/2004 PRIOR to ERCP w/spincterotomy, bile duct balloon dredging and lap chole)  . Recurrent fever     W/u unrevealing, including ID consult.    . Asthma   . Osteopenia     bone densitometry 2006 per pt  . 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  Christella Hartigan  . Multinodular goiter 11/2010    TSH normal  . Chest pain   . Rectal prolapse 02/2011    repaired lap [id number]  . Vitamin D deficiency 08/20/2011  . History of migraine headaches     Dr. Anne Hahn at [facility] 03/2012: started nortriptyline 10mg --titrating to 3 caps qhs  . Edema 10/31/2011  . Lobar pneumonia mid May, 2013    RML and RLL, with sepsis--admission to [facility] 12/2011--responded extremely well to rocephin + zithromax.  . IBS (irritable bowel syndrome)   . De Quervain's tenosynovitis, left 11/11/2011   Past surgical, social, and family history reviewed and no changes noted since last office visit.  MEDS:  Outpatient Prescriptions Prior to Visit  Medication Sig Dispense Refill  . albuterol (PROAIR HFA) 108 (90 BASE) MCG/ACT inhaler Inhale 2 puffs into the lungs every 4 (four) hours as needed. Shortness of breath  1 Inhaler  1  . amLODipine (NORVASC) 5 MG tablet Take 1 tablet (5 mg total) by mouth daily.  90 tablet  1  . Calcium Carbonate (CALCIUM 500 PO) Take 1 tablet by mouth daily.       . carisoprodol (SOMA) 250 MG tablet Take 1 tablet (250 mg total) by mouth 3 (three) times daily.  270 tablet  0  . clonazePAM (KLONOPIN) 1 MG tablet 1 tab po bid prn  180 tablet  0  . Fish Oil OIL Take 2 capsules by mouth daily.       . Fluticasone-Salmeterol (ADVAIR DISKUS) 250-50 MCG/DOSE AEPB Inhale 1 puff into the lungs every 12 (twelve) hours.       . hyoscyamine (LEVSIN SL) 0.125 MG SL tablet Take 1-2 tablets (0.125-0.25 mg total) by mouth every 6 (six) hours as needed for cramping.  270 tablet  1  . MAGNESIUM PO Take 1 tablet by mouth daily. Unknown strength      . meloxicam (MOBIC) 7.5 MG tablet Take 1 tablet (7.5 mg total) by mouth daily.  90 tablet  1  . ondansetron (ZOFRAN-ODT) 8 MG disintegrating tablet Take 1 tablet (8 mg total) by mouth every 8 (eight) hours as needed. For nausea  60 tablet  1  . oxyCODONE (OXY IR/ROXICODONE) 5 MG immediate release tablet Take 1-2 tablets (5-10 mg  total) by mouth every 4 (four) hours as needed.  300 tablet  0  . OxyCODONE HCl ER (OXYCONTIN) 60 MG T12A Take 1 tablet by mouth 2 (two) times daily.  60 each  0  . phenazopyridine (PYRIDIUM) 200 MG tablet Take 1 tablet (200 mg total) by mouth 3 (three) times daily as needed for pain.  90 tablet  2  . polyethylene glycol powder (GLYCOLAX/MIRALAX) powder DISSOLVE 1 CAPFUL INTO LIQUID AND TAKE BY MOUTH ONE TO THREE TIMES DAILY AS NEEDED FOR CONSTIPATION  1581 g  3  . pramipexole (MIRAPEX) 0.125 MG tablet 3 tabs po qhs  270 tablet  1  . ranitidine (ZANTAC) 150 MG tablet Take 1 tablet (150 mg total) by mouth 2 (two) times daily.  180 tablet  1  . sucralfate (CARAFATE) 1 G tablet 1 tab about 1 hour prior to each meal prn      . SUMAtriptan (IMITREX) 50 MG tablet Take 2 tablets (100 mg total) by mouth every 2 (two) hours as needed (migraine HA (max of 200mg /24h).  27 tablet  1  . zolpidem (AMBIEN) 10 MG tablet Take 1 tablet (10 mg total) by mouth at bedtime.  90 tablet  0   Last reviewed on 07/21/2012  1:49 PM by Luisa Dago, CMA  PE: Blood pressure 132/83, pulse 113, temperature 99 F (37.2 C), temperature source Temporal, height 5' 4.5" (1.638 m), weight 166 lb (75.297 kg), SpO2 97.00%. Gen: Alert, well appearing.  Patient is oriented to person, place, time, and situation. Mild TTP over both clavicles, without crepitus or deformity. No bruising.  Right chest wall TTP in upper 1/3. ENT: +mild left maxillary sinus TTP.  No mucous drainage in nose.  Eyes without redness or swelling. Throat w/out erythema, swelling, or exudate Neck: no LAD or TM CV: RRR, no m/r/g.   LUNGS: CTA bilat, nonlabored resps, good aeration in all lung fields. Regarding soft tissue tenderness, as per her usual exam she has tenderness in any soft tissue area I palpate from head to toe, symmetric.  IMPRESSION AND PLAN:  Chest wall pain Discussed the likely benign nature of this. We discussed the idea of an x-ray  but ultimately decided against this. Recommended rest, ice, heat.  Sinusitis, acute Amoxil 875 mg bid x 10d.  FIBROMYALGIA, SEVERE Continue current treatments. RF'd pain meds today.   FEVER, RECURRENT This seems to be resolved.  Adjustment disorder She continues to have emotional instability; most recently related to her husband's infidelity and their subsequent relationship woes. She won't seek counseling and neither will he.   No new psych med  trial at this time.  FATIGUE With her fibromyalgia sx's and other many/varied sx's in the past we have agreed to a reasonable lab re-evaluation periodically, so today I'll check these (CBC, ESR, etc).   An After Visit Summary was printed and given to the patient.  FOLLOW UP: 81mo

## 2012-07-30 ENCOUNTER — Encounter: Payer: Self-pay | Admitting: Family Medicine

## 2012-07-30 DIAGNOSIS — R0789 Other chest pain: Secondary | ICD-10-CM | POA: Insufficient documentation

## 2012-07-30 NOTE — Assessment & Plan Note (Signed)
Amoxil 875 mg bid x 10d.

## 2012-07-30 NOTE — Assessment & Plan Note (Signed)
Discussed the likely benign nature of this. We discussed the idea of an x-ray but ultimately decided against this. Recommended rest, ice, heat.

## 2012-07-30 NOTE — Assessment & Plan Note (Signed)
She continues to have emotional instability; most recently related to her husband's infidelity and their subsequent relationship woes. She won't seek counseling and neither will he.   No new psych med trial at this time.

## 2012-07-30 NOTE — Assessment & Plan Note (Signed)
With her fibromyalgia sx's and other many/varied sx's in the past we have agreed to a reasonable lab re-evaluation periodically, so today I'll check these (CBC, ESR, etc).

## 2012-07-30 NOTE — Assessment & Plan Note (Signed)
Continue current treatments. RF'd pain meds today.

## 2012-07-30 NOTE — Assessment & Plan Note (Signed)
This seems to be resolved   

## 2012-08-20 ENCOUNTER — Ambulatory Visit: Payer: BC Managed Care – PPO | Admitting: Family Medicine

## 2012-08-21 ENCOUNTER — Encounter: Payer: Self-pay | Admitting: Family Medicine

## 2012-08-21 ENCOUNTER — Ambulatory Visit (INDEPENDENT_AMBULATORY_CARE_PROVIDER_SITE_OTHER): Payer: BC Managed Care – PPO | Admitting: Family Medicine

## 2012-08-21 VITALS — BP 107/74 | HR 108 | Ht 64.5 in | Wt 170.0 lb

## 2012-08-21 DIAGNOSIS — IMO0001 Reserved for inherently not codable concepts without codable children: Secondary | ICD-10-CM

## 2012-08-21 DIAGNOSIS — E042 Nontoxic multinodular goiter: Secondary | ICD-10-CM

## 2012-08-21 DIAGNOSIS — R51 Headache: Secondary | ICD-10-CM

## 2012-08-21 DIAGNOSIS — M797 Fibromyalgia: Secondary | ICD-10-CM

## 2012-08-21 DIAGNOSIS — A689 Relapsing fever, unspecified: Secondary | ICD-10-CM

## 2012-08-21 DIAGNOSIS — G8929 Other chronic pain: Secondary | ICD-10-CM

## 2012-08-21 DIAGNOSIS — R071 Chest pain on breathing: Secondary | ICD-10-CM

## 2012-08-21 DIAGNOSIS — H04129 Dry eye syndrome of unspecified lacrimal gland: Secondary | ICD-10-CM

## 2012-08-21 DIAGNOSIS — F4320 Adjustment disorder, unspecified: Secondary | ICD-10-CM

## 2012-08-21 DIAGNOSIS — R0789 Other chest pain: Secondary | ICD-10-CM

## 2012-08-21 MED ORDER — OXYCODONE HCL ER 60 MG PO T12A: 1.0000 | EXTENDED_RELEASE_TABLET | Freq: Two times a day (BID) | ORAL | Status: DC

## 2012-08-21 MED ORDER — OXYCODONE HCL 5 MG PO TABS: 5.0000 mg | ORAL_TABLET | ORAL | Status: DC | PRN

## 2012-08-21 NOTE — Progress Notes (Signed)
OFFICE NOTE  08/22/2012  CC:  Chief Complaint  Patient presents with  . Follow-up    1 month fibromyalgia     HPI: Patient is a 47 y.o. Caucasian female who is here for routine 1 mo f/u chronic pain from severe fibromyalgia. Pain control is same as her usual: still hurts all the time in all areas of her body, including chronic HAs (some tension, some migrainous).  There has been no change in her functional status since last visit: still unable to do more than 15 min of housework without having to stop and rest.  Still using heat and ice as supplement to her pain meds.   She still takes phenergan and zofran seemingly round the clock.  Although we have discussed in the past that these are not "maintenance" drugs, she reports that she basically feels chronic nausea without them, and sometimes it gets more severe, such as when she has migraines.  A reasonable GI evaluation has not turned up any identifiable pathology, so our working diagnosis has been function GI disorder.  Has had only one febrile "episode" since last visit--it matched her usual episodes of this in the past. Chest pain (musculoskeletal) she was having last visit has completely resolved. Sinusitis sx's she was having last time have improved. Mood is ok, says she and her husband are trying to work things out.  She thinks she is doing ok without any antidepressants at this time.  Pertinent PMH:  Past Medical History  Diagnosis Date  . DDD (degenerative disc disease), lumbar   . Atypical chest pain     Question of coronary vasospasm; cath 10/20/10 NORMAL  . Anxiety   . Fibromyalgia     Dr. Hope Pigeon [phone]  . Interstitial cystitis   . Obesity   . Insomnia   . Transaminasemia 03/2010    (ALT 49), normal on f/u testing off of daily tylenol uuse.  Marland Kitchen GERD (gastroesophageal reflux disease) 06/01/10    Dyspepsia/gastritis, EGD 05/2010: gastritis (NSAIDS)  . Rectal bleeding 06/01/10    Anorectal  bleeding/BRBPR--colonoscopy: internal hemorrhoids  . Recurrent UTI     renal u/s normal 07/2007  . Weight loss     CT abd/pelf 08/22/10 remakable only for bile duct 9mm (unchanged from 03/2004 PRIOR to ERCP w/spincterotomy, bile duct balloon dredging and lap chole)  . Recurrent fever     W/u unrevealing, including ID consult.    . Asthma   . Osteopenia     bone densitometry 2006 per pt  . 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 Christella Hartigan  . Multinodular goiter 11/2010    TSH normal  . Chest pain   . Rectal prolapse 02/2011    repaired lap [id number]  . Vitamin D deficiency 08/20/2011  . History of migraine headaches     Dr. Anne Hahn at [facility] 03/2012: started nortriptyline 10mg --titrating to 3 caps qhs  . Edema 10/31/2011  . Lobar pneumonia mid May, 2013    RML and RLL, with sepsis--admission to [facility] 12/2011--responded extremely well to rocephin + zithromax.  . IBS (irritable bowel syndrome)   . De Quervain's tenosynovitis, left 11/11/2011  . Anemia, iron deficiency 06/14/2011    Hemoccults NEG x 3 in December 2012    Past surgical, social, and family history reviewed and no changes noted since last office visit.  MEDS:  Outpatient Prescriptions Prior to Visit  Medication Sig Dispense Refill  . albuterol (PROAIR HFA) 108 (90 BASE) MCG/ACT inhaler Inhale 2 puffs  into the lungs every 4 (four) hours as needed. Shortness of breath  1 Inhaler  1  . amLODipine (NORVASC) 5 MG tablet Take 1 tablet (5 mg total) by mouth daily.  90 tablet  1  . Calcium Carbonate (CALCIUM 500 PO) Take 1 tablet by mouth daily.       . carisoprodol (SOMA) 250 MG tablet Take 1 tablet (250 mg total) by mouth 3 (three) times daily.  270 tablet  0  . clonazePAM (KLONOPIN) 1 MG tablet 1 tab po bid prn  180 tablet  0  . Fish Oil OIL Take 2 capsules by mouth daily.       . Fluticasone-Salmeterol (ADVAIR DISKUS) 250-50 MCG/DOSE AEPB Inhale 1 puff into the lungs every 12 (twelve) hours.       .  hyoscyamine (LEVSIN SL) 0.125 MG SL tablet Take 1-2 tablets (0.125-0.25 mg total) by mouth every 6 (six) hours as needed for cramping.  270 tablet  1  . MAGNESIUM PO Take 1 tablet by mouth daily. Unknown strength      . meloxicam (MOBIC) 7.5 MG tablet Take 1 tablet (7.5 mg total) by mouth daily.  90 tablet  1  . ondansetron (ZOFRAN-ODT) 8 MG disintegrating tablet Take 1 tablet (8 mg total) by mouth every 8 (eight) hours as needed. For nausea  270 tablet  1  . phenazopyridine (PYRIDIUM) 200 MG tablet Take 1 tablet (200 mg total) by mouth 3 (three) times daily as needed for pain.  90 tablet  2  . polyethylene glycol powder (GLYCOLAX/MIRALAX) powder DISSOLVE 1 CAPFUL INTO LIQUID AND TAKE BY MOUTH ONE TO THREE TIMES DAILY AS NEEDED FOR CONSTIPATION  1581 g  3  . pramipexole (MIRAPEX) 0.125 MG tablet 3 tabs po qhs  270 tablet  1  . ranitidine (ZANTAC) 150 MG tablet Take 1 tablet (150 mg total) by mouth 2 (two) times daily.  180 tablet  1  . sucralfate (CARAFATE) 1 G tablet 1 tab about 1 hour prior to each meal prn      . SUMAtriptan (IMITREX) 50 MG tablet Take 2 tablets (100 mg total) by mouth every 2 (two) hours as needed (migraine HA (max of 200mg /24h).  27 tablet  1  . zolpidem (AMBIEN) 10 MG tablet Take 1 tablet (10 mg total) by mouth at bedtime.  90 tablet  0  . [DISCONTINUED] oxyCODONE (OXY IR/ROXICODONE) 5 MG immediate release tablet Take 1-2 tablets (5-10 mg total) by mouth every 4 (four) hours as needed.  300 tablet  0  . [DISCONTINUED] OxyCODONE HCl ER (OXYCONTIN) 60 MG T12A Take 1 tablet by mouth 2 (two) times daily.  60 each  0  Last reviewed on 08/21/2012  1:29 PM by Luisa Dago, CMA  PE: Blood pressure 107/74, pulse 108, height 5' 4.5" (1.638 m), weight 170 lb (77.111 kg). Gen: Alert, well appearing.  Patient is oriented to person, place, time, and situation. ENT:  Eyes: no injection, icteris, swelling, or exudate.  EOMI, PERRLA. Nose: no drainage or turbinate edema/swelling.    Mouth: lips without lesion/swelling.   CV: regular, mild tachycardia (110), no m/r/g LUNGS: CTA bilat, nonlabored resps Musculoskeletal: she is tender to palpation in every muscle area I palpate from her neck down to her feet.  She has no joint swelling, erythema, or warmth.   Skin: no rash  IMPRESSION AND PLAN:  FIBROMYALGIA, SEVERE Stable.  She basically deals with chronic pain despite chronic narcotic pain med use. I have explained to  her that I have reached the "ceiling" doses of narcotics that I'm willing to treat her with.  I have offered pain clinic referrals in the past but she has declined.  She understands that typically narcotics are not used to treat fibromyalgia, but in attempt to make her life tolerable and make her minimally functional we decided to start them and now we must continue them (she is not in favor or a trial of weening off of these meds).   UDS 2 mo ago showed appropriate/expected results. Will repeat this in 1 mo at next f/u visit.  RF'd oxycontin and oxycodone today-1 mo supply of each.  Chronic headaches Tension HA's and Migraine HA's. Stable on current med regimen.  Adjustment disorder With anxiety and depressed mood. Doing better lately.  Some marital problems are being worked on. She does not want to start any new psych meds.  Chest wall pain Spontaneously resolved.  FEVER, RECURRENT The frequency of these episodes has diminished significantly. Extensive w/u unrevealing, watchful waiting approach being taken and will continue with this.   An After Visit Summary was printed and given to the patient.  FOLLOW UP: 1 mo

## 2012-08-21 NOTE — Assessment & Plan Note (Signed)
Stable.  She basically deals with chronic pain despite chronic narcotic pain med use. I have explained to her that I have reached the "ceiling" doses of narcotics that I'm willing to treat her with.  I have offered pain clinic referrals in the past but she has declined.  She understands that typically narcotics are not used to treat fibromyalgia, but in attempt to make her life tolerable and make her minimally functional we decided to start them and now we must continue them (she is not in favor or a trial of weening off of these meds).   UDS 2 mo ago showed appropriate/expected results. Will repeat this in 1 mo at next f/u visit.  RF'd oxycontin and oxycodone today-1 mo supply of each.

## 2012-08-22 NOTE — Assessment & Plan Note (Signed)
The frequency of these episodes has diminished significantly. Extensive w/u unrevealing, watchful waiting approach being taken and will continue with this.

## 2012-08-22 NOTE — Assessment & Plan Note (Signed)
With anxiety and depressed mood. Doing better lately.  Some marital problems are being worked on. She does not want to start any new psych meds.

## 2012-08-22 NOTE — Assessment & Plan Note (Signed)
Spontaneously resolved.

## 2012-08-22 NOTE — Assessment & Plan Note (Signed)
Tension HA's and Migraine HA's. Stable on current med regimen.

## 2012-08-25 ENCOUNTER — Telehealth: Payer: Self-pay | Admitting: *Deleted

## 2012-08-25 NOTE — Telephone Encounter (Signed)
Prior auth for ONDANSETRON initiated.  Case #[id number] with [employer].  Form will be faxed.

## 2012-09-03 NOTE — Telephone Encounter (Signed)
Form received and faxed to [facility].

## 2012-09-19 ENCOUNTER — Ambulatory Visit: Payer: BC Managed Care – PPO | Admitting: Family Medicine

## 2012-09-22 ENCOUNTER — Ambulatory Visit (INDEPENDENT_AMBULATORY_CARE_PROVIDER_SITE_OTHER): Payer: BC Managed Care – PPO | Admitting: Family Medicine

## 2012-09-22 ENCOUNTER — Encounter: Payer: Self-pay | Admitting: Family Medicine

## 2012-09-22 ENCOUNTER — Other Ambulatory Visit: Payer: Self-pay | Admitting: Family Medicine

## 2012-09-22 VITALS — BP 120/82 | HR 64 | Resp 20 | Ht 64.5 in | Wt 198.0 lb

## 2012-09-22 DIAGNOSIS — M2550 Pain in unspecified joint: Secondary | ICD-10-CM

## 2012-09-22 DIAGNOSIS — J4540 Moderate persistent asthma, uncomplicated: Secondary | ICD-10-CM

## 2012-09-22 DIAGNOSIS — R635 Abnormal weight gain: Secondary | ICD-10-CM

## 2012-09-22 DIAGNOSIS — A689 Relapsing fever, unspecified: Secondary | ICD-10-CM

## 2012-09-22 DIAGNOSIS — H04129 Dry eye syndrome of unspecified lacrimal gland: Secondary | ICD-10-CM

## 2012-09-22 DIAGNOSIS — R5381 Other malaise: Secondary | ICD-10-CM

## 2012-09-22 DIAGNOSIS — J45909 Unspecified asthma, uncomplicated: Secondary | ICD-10-CM

## 2012-09-22 DIAGNOSIS — E042 Nontoxic multinodular goiter: Secondary | ICD-10-CM

## 2012-09-22 DIAGNOSIS — R5383 Other fatigue: Secondary | ICD-10-CM

## 2012-09-22 DIAGNOSIS — M797 Fibromyalgia: Secondary | ICD-10-CM

## 2012-09-22 DIAGNOSIS — IMO0001 Reserved for inherently not codable concepts without codable children: Secondary | ICD-10-CM

## 2012-09-22 LAB — COMPREHENSIVE METABOLIC PANEL
Albumin: 3.6 g/dL (ref 3.5–5.2)
BUN: 5 mg/dL — ABNORMAL LOW (ref 6–23)
CO2: 31 mEq/L (ref 19–32)
Calcium: 8.9 mg/dL (ref 8.4–10.5)
Chloride: 91 mEq/L — ABNORMAL LOW (ref 96–112)
GFR: 90.51 mL/min (ref >60.00)
Glucose, Bld: 85 mg/dL (ref 70–99)
Potassium: 4.5 mEq/L (ref 3.5–5.1)
Sodium: 129 mEq/L — ABNORMAL LOW (ref 135–145)
Total Protein: 6.5 g/dL (ref 6.0–8.3)

## 2012-09-22 LAB — POCT URINALYSIS DIPSTICK
Glucose, UA: NEGATIVE
Ketones, UA: NEGATIVE
Spec Grav, UA: 1.015

## 2012-09-22 LAB — CBC WITH DIFFERENTIAL/PLATELET
Eosinophils Relative: 4.1 % (ref 0.0–5.0)
Lymphocytes Relative: 14.2 % (ref 12.0–46.0)
MCV: 88.3 fl (ref 78.0–100.0)
Monocytes Absolute: 0.4 10*3/uL (ref 0.1–1.0)
Monocytes Relative: 6 % (ref 3.0–12.0)
Neutrophils Relative %: 75.1 % (ref 43.0–77.0)
Platelets: 242 10*3/uL (ref 150.0–400.0)
WBC: 6.6 10*3/uL (ref 4.5–10.5)

## 2012-09-22 LAB — T4, FREE: Free T4: 0.83 ng/dL (ref 0.60–1.60)

## 2012-09-22 LAB — TSH: TSH: 1.01 u[IU]/mL (ref 0.35–5.50)

## 2012-09-22 MED ORDER — PREDNISONE 20 MG PO TABS: ORAL_TABLET | ORAL | Status: DC

## 2012-09-22 MED ORDER — FLUTICASONE-SALMETEROL 250-50 MCG/DOSE IN AEPB: 1.0000 | INHALATION_SPRAY | Freq: Two times a day (BID) | RESPIRATORY_TRACT | Status: DC

## 2012-09-22 MED ORDER — ZOLPIDEM TARTRATE 10 MG PO TABS: 10.0000 mg | ORAL_TABLET | Freq: Every day | ORAL | Status: DC

## 2012-09-22 MED ORDER — CLONAZEPAM 1 MG PO TABS: ORAL_TABLET | ORAL | Status: DC

## 2012-09-22 MED ORDER — OXYCODONE HCL 5 MG PO TABS: 5.0000 mg | ORAL_TABLET | ORAL | Status: DC | PRN

## 2012-09-22 MED ORDER — OXYCODONE HCL ER 60 MG PO T12A: 1.0000 | EXTENDED_RELEASE_TABLET | Freq: Two times a day (BID) | ORAL | Status: DC

## 2012-09-22 NOTE — Progress Notes (Signed)
OFFICE NOTE  09/22/2012  CC:  Chief Complaint  Patient presents with  . Follow-up    chronic pain; also "thyroditis" flare; pt reviewed printed med list and states all meds are correct     HPI: Patient is a 47 y.o. Caucasian female who is here for f/u fibromyalgia--chronic narcotic requirement for functionality, hx of multinodular goiter (euthyroid), mood disorder.  She is distressed about 28 lb wt gain over the last 1 mo.  Says she feels swollen and uncomfortable in submadibular and submental region x 1 wk, more sore in right submandibular region in the region where she has had recently had a dental infection.  Penicillin started but no help so clindamycin started and she's been on this for 4d.  Feels no change.   No signif fevers.  Plan is for root canal tomorrow.  Says eating habits have not changed: examples of foods she commonly eats: grilled chicken, salads, oatmeal, english muffin, cereal, frozen yogurt, apples.  Drinks diet sodas, decaf tea with splenda, uses light dressing.  Urinary habits: she feels like she possibly has slightly diminished urine output. Knees and hands very painful lately--more than her usual.  However, no redness or swelling of these joints. No abnormal body hair growth, no acne.  +More feeling of fatigue/malaise lately. No rash.  She has been having more trouble with wheezing consistently for the last week or so, requiring rescue inhaler daily.  No fever, no signif SOB but chest feels tight.  Pertinent PMH:  Past Medical History  Diagnosis Date  . DDD (degenerative disc disease), lumbar   . Atypical chest pain     Question of coronary vasospasm; cath 10/20/10 NORMAL  . Anxiety   . Fibromyalgia     Dr. Hope Pigeon [phone]  . Interstitial cystitis   . Obesity   . Insomnia   . Transaminasemia 03/2010    (ALT 49), normal on f/u testing off of daily tylenol uuse.  Marland Kitchen GERD (gastroesophageal reflux disease) 06/01/10    Dyspepsia/gastritis, EGD 05/2010:  gastritis (NSAIDS)  . Rectal bleeding 06/01/10    Anorectal bleeding/BRBPR--colonoscopy: internal hemorrhoids  . Recurrent UTI     renal u/s normal 07/2007  . Weight loss     CT abd/pelf 08/22/10 remakable only for bile duct 9mm (unchanged from 03/2004 PRIOR to ERCP w/spincterotomy, bile duct balloon dredging and lap chole)  . Recurrent fever     W/u unrevealing, including ID consult.    . Asthma   . Osteopenia     bone densitometry 2006 per pt  . 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 Christella Hartigan  . Multinodular goiter 11/2010    TSH normal  . Chest pain   . Rectal prolapse 02/2011    repaired lap [id number]  . Vitamin D deficiency 08/20/2011  . History of migraine headaches     Dr. Anne Hahn at [facility] 03/2012: started nortriptyline 10mg --titrating to 3 caps qhs  . Edema 10/31/2011  . Lobar pneumonia mid May, 2013    RML and RLL, with sepsis--admission to [facility] 12/2011--responded extremely well to rocephin + zithromax.  . IBS (irritable bowel syndrome)   . De Quervain's tenosynovitis, left 11/11/2011  . Anemia, iron deficiency 06/14/2011    Hemoccults NEG x 3 in December 2012     MEDS:  Outpatient Prescriptions Prior to Visit  Medication Sig Dispense Refill  . albuterol (PROAIR HFA) 108 (90 BASE) MCG/ACT inhaler Inhale 2 puffs into the lungs every 4 (four) hours as needed. Shortness  of breath  1 Inhaler  1  . amLODipine (NORVASC) 5 MG tablet Take 1 tablet (5 mg total) by mouth daily.  90 tablet  1  . Calcium Carbonate (CALCIUM 500 PO) Take 1 tablet by mouth daily.       . carisoprodol (SOMA) 250 MG tablet Take 1 tablet (250 mg total) by mouth 3 (three) times daily.  270 tablet  0  . clonazePAM (KLONOPIN) 1 MG tablet 1 tab po bid prn  180 tablet  0  . Fish Oil OIL Take 2 capsules by mouth daily.       . Fluticasone-Salmeterol (ADVAIR DISKUS) 250-50 MCG/DOSE AEPB Inhale 1 puff into the lungs every 12 (twelve) hours.       . hyoscyamine (LEVSIN SL) 0.125 MG SL tablet  Take 1-2 tablets (0.125-0.25 mg total) by mouth every 6 (six) hours as needed for cramping.  270 tablet  1  . MAGNESIUM PO Take 1 tablet by mouth daily. Unknown strength      . meloxicam (MOBIC) 7.5 MG tablet Take 1 tablet (7.5 mg total) by mouth daily.  90 tablet  1  . ondansetron (ZOFRAN-ODT) 8 MG disintegrating tablet Take 1 tablet (8 mg total) by mouth every 8 (eight) hours as needed. For nausea  270 tablet  1  . oxyCODONE (OXY IR/ROXICODONE) 5 MG immediate release tablet Take 1-2 tablets (5-10 mg total) by mouth every 4 (four) hours as needed.  300 tablet  0  . OxyCODONE HCl ER (OXYCONTIN) 60 MG T12A Take 1 tablet by mouth 2 (two) times daily.  60 each  0  . phenazopyridine (PYRIDIUM) 200 MG tablet Take 1 tablet (200 mg total) by mouth 3 (three) times daily as needed for pain.  90 tablet  2  . polyethylene glycol powder (GLYCOLAX/MIRALAX) powder DISSOLVE 1 CAPFUL INTO LIQUID AND TAKE BY MOUTH ONE TO THREE TIMES DAILY AS NEEDED FOR CONSTIPATION  1581 g  3  . pramipexole (MIRAPEX) 0.125 MG tablet 3 tabs po qhs  270 tablet  1  . promethazine (PHENERGAN) 25 MG tablet Take 25-50 mg by mouth every 6 (six) hours as needed.       . ranitidine (ZANTAC) 150 MG tablet Take 1 tablet (150 mg total) by mouth 2 (two) times daily.  180 tablet  1  . sucralfate (CARAFATE) 1 G tablet 1 tab about 1 hour prior to each meal prn      . SUMAtriptan (IMITREX) 50 MG tablet Take 2 tablets (100 mg total) by mouth every 2 (two) hours as needed (migraine HA (max of 200mg /24h).  27 tablet  1  . zolpidem (AMBIEN) 10 MG tablet Take 1 tablet (10 mg total) by mouth at bedtime.  90 tablet  0   No facility-administered medications prior to visit.    PE: Blood pressure 120/82, pulse 64, resp. rate 20, height 5' 4.5" (1.638 m), weight 198 lb (89.812 kg). Gen: Alert, well appearing.  Patient is oriented to person, place, time, and situation. AFFECT: flat today ENT: Eyes: no injection, icteris, swelling, or exudate.  EOMI,  PERRLA. Nose: no drainage or turbinate edema/swelling.  No injection or focal lesion.  Mouth: lips without lesion/swelling.  Oral mucosa pink and moist.    Oropharynx without erythema, exudate, or swelling.  Neck - No masses or thyromegaly or limitation in range of motion CV: RRR (rate 90s by me), no m/r/g LUNGS: clear on inspiration, diffuse wheezing on expiration, nonlabored resps.  EXT: no cyanosis.  Trace pitting edema  in both lower legs. SKIN: no rash Hands: no erythema or joint swelling  LABS: UA today showed small LEU and trace intact RBCs, otherwise normal.  IMPRESSION AND PLAN:  FIBROMYALGIA, SEVERE Mild worsening of some of her arthralgias lately, but myalgias are unchanged. I am going to check some routine rheum labs today in light of her worsening jt pain and her wt gain lately, as per our plan of making sure we keep up periodic surveillance for alternate dx as an explanation for her musculoskeletal and sometimes systemic complaints. RF'd pain meds today.  Asthma, moderate persistent Mild acute flare currently: rx'd prednisone 40mg  qd x 5d. Continue advair and prn albuterol.  Multinodular goiter She has been euthyroid. Will recheck TSH, free T4, and T3 total today (in light of her marked wt gain over the last 1 mo +malaise/fatigue worsening).  Abnormal weight gain I don't detect any significant edema. Check CMET, ESR, TSH. She is sedentary, unfortunately.   An After Visit Summary was printed and given to the patient.  FOLLOW UP: 1 mo

## 2012-09-23 LAB — T3: T3, Total: 161 ng/dL (ref 80.0–204.0)

## 2012-09-23 LAB — ANTI-NUCLEAR AB-TITER (ANA TITER): ANA Titer 1: 1:40 {titer} — ABNORMAL HIGH

## 2012-09-23 LAB — RHEUMATOID FACTOR: Rhuematoid fact SerPl-aCnc: <10 IU/mL (ref <=14)

## 2012-09-23 LAB — ANA: Anti Nuclear Antibody(ANA): POSITIVE — AB

## 2012-09-24 LAB — CYCLIC CITRUL PEPTIDE ANTIBODY, IGG: Cyclic Citrullin Peptide Ab: <2 U/mL (ref 0.0–5.0)

## 2012-09-25 NOTE — Assessment & Plan Note (Addendum)
Mild worsening of some of her arthralgias lately, but myalgias are unchanged. I am going to check some routine rheum labs today in light of her worsening jt pain and her wt gain lately, as per our plan of making sure we keep up periodic surveillance for alternate dx as an explanation for her musculoskeletal and sometimes systemic complaints. RF'd pain meds today.

## 2012-09-25 NOTE — Assessment & Plan Note (Signed)
I don't detect any significant edema. Check CMET, ESR, TSH. She is sedentary, unfortunately.

## 2012-09-25 NOTE — Assessment & Plan Note (Addendum)
She has been euthyroid. Will recheck TSH, free T4, and T3 total today (in light of her marked wt gain over the last 1 mo +malaise/fatigue worsening).

## 2012-09-25 NOTE — Assessment & Plan Note (Signed)
Mild acute flare currently: rx'd prednisone 40mg  qd x 5d. Continue advair and prn albuterol.

## 2012-09-25 NOTE — Progress Notes (Signed)
OK. Dx is Positive ANA.-thx

## 2012-09-26 LAB — ANTI-DNA ANTIBODY, DOUBLE-STRANDED: ds DNA Ab: 2 IU/mL (ref <30)

## 2012-10-15 ENCOUNTER — Other Ambulatory Visit: Payer: Self-pay | Admitting: Family Medicine

## 2012-10-17 ENCOUNTER — Ambulatory Visit (INDEPENDENT_AMBULATORY_CARE_PROVIDER_SITE_OTHER): Payer: BC Managed Care – PPO | Admitting: Family Medicine

## 2012-10-17 ENCOUNTER — Encounter: Payer: Self-pay | Admitting: Family Medicine

## 2012-10-17 VITALS — BP 110/72 | HR 84 | Temp 98.4°F | Ht 64.5 in | Wt 185.0 lb

## 2012-10-17 DIAGNOSIS — E042 Nontoxic multinodular goiter: Secondary | ICD-10-CM

## 2012-10-17 DIAGNOSIS — R51 Headache: Secondary | ICD-10-CM

## 2012-10-17 DIAGNOSIS — R5381 Other malaise: Secondary | ICD-10-CM

## 2012-10-17 MED ORDER — OXYCODONE HCL ER 60 MG PO T12A: 1.0000 | EXTENDED_RELEASE_TABLET | Freq: Two times a day (BID) | ORAL | Status: DC

## 2012-10-17 MED ORDER — FLUTICASONE-SALMETEROL 500-50 MCG/DOSE IN AEPB: 1.0000 | INHALATION_SPRAY | Freq: Two times a day (BID) | RESPIRATORY_TRACT | Status: DC

## 2012-10-17 MED ORDER — OXYCODONE HCL 5 MG PO TABS: 5.0000 mg | ORAL_TABLET | ORAL | Status: DC | PRN

## 2012-10-17 MED ORDER — PREDNISONE 20 MG PO TABS: ORAL_TABLET | ORAL | Status: DC

## 2012-10-17 NOTE — Telephone Encounter (Signed)
eScribe request for refill on SOMA Last filled - 07/16/12, #[phone] X 0 Last seen on - 09/22/12 Follow up - 10/17/12 Please advise refills.

## 2012-10-17 NOTE — Progress Notes (Signed)
OFFICE NOTE  10/17/2012  CC:  Chief Complaint  Patient presents with  . Follow-up    fibro, wt gain, HA     HPI: Patient is a 47 y.o. Caucasian female who is here for routine f/u severe fibromyalgia, chronic migraines, asthma. States asthma worse lately, requiring albut inhaler 2-3 times per day lately--the majority of the last month.  Triggers are cold weather and walking, sometimes spontaneous.  Compliant with advair. Says HA's still occuring daily, with an actual migraine 2-3 times per week.   NO febrile "episodes" since I saw her last.   Hands, hips, and knees achy and stiff and all of her usual soft tissues are tender.  No rash.  She is nauseated most of the time but does not vomit: triggers are her IBS and her HA's.  She does try to use the nausea meds prn but still ends up needing quite a bit.     Pertinent PMH:  Past Medical History  Diagnosis Date  . DDD (degenerative disc disease), lumbar   . Atypical chest pain     Question of coronary vasospasm; cath 10/20/10 NORMAL  . Anxiety   . Fibromyalgia     Dr. Hope Pigeon [phone]  . Interstitial cystitis   . Obesity   . Insomnia   . Transaminasemia 03/2010    (ALT 49), normal on f/u testing off of daily tylenol uuse.  Marland Kitchen GERD (gastroesophageal reflux disease) 06/01/10    Dyspepsia/gastritis, EGD 05/2010: gastritis (NSAIDS)  . Rectal bleeding 06/01/10    Anorectal bleeding/BRBPR--colonoscopy: internal hemorrhoids  . Recurrent UTI     renal u/s normal 07/2007  . Weight loss     CT abd/pelf 08/22/10 remakable only for bile duct 9mm (unchanged from 03/2004 PRIOR to ERCP w/spincterotomy, bile duct balloon dredging and lap chole)  . Recurrent fever     W/u unrevealing, including ID consult.    . Asthma   . Osteopenia     bone densitometry 2006 per pt  . 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 Christella Hartigan  . Multinodular goiter 11/2010    TSH normal  . Chest pain   . Rectal prolapse 02/2011   repaired lap [id number]  . Vitamin D deficiency 08/20/2011  . History of migraine headaches     Dr. Anne Hahn at [facility] 03/2012: started nortriptyline 10mg --titrating to 3 caps qhs  . Edema 10/31/2011  . Lobar pneumonia mid May, 2013    RML and RLL, with sepsis--admission to [facility] 12/2011--responded extremely well to rocephin + zithromax.  . IBS (irritable bowel syndrome)   . De Quervain's tenosynovitis, left 11/11/2011  . Anemia, iron deficiency 06/14/2011    Hemoccults NEG x 3 in December 2012    Past Surgical History  Procedure Laterality Date  . Bunionectomy      right foot  . Abdominal hysterectomy    . Bilateral salpingoophorectomy  2003    endometriosis  . Cholecystectomy  2005    lap--(cholelithiasis w/choledocholithiasis on u/s 2005--PreOperative ERCP showed NO stone or other obstruction in the biliary tract, but CBD messured 9mm near head of pancreas.  S sphincterotomy and bile duct balloon dredging was done at that time.  MRCP 10/2010 NORMAL.  Marland Kitchen Orif metatarsal fracture  2006    Right 2nd and 3rd metatarsals (Dr. Romeo Apple)  . Biopsy thyroid  11/2010    Fine needle: NONmalignant goiter  . Rectal prolapse repair, rectopexy  05July2012    with sigmoid colectomy    MEDS:  Outpatient Prescriptions Prior to Visit  Medication Sig Dispense Refill  . albuterol (PROAIR HFA) 108 (90 BASE) MCG/ACT inhaler Inhale 2 puffs into the lungs every 4 (four) hours as needed. Shortness of breath  1 Inhaler  1  . amLODipine (NORVASC) 5 MG tablet Take 1 tablet (5 mg total) by mouth daily.  90 tablet  1  . Calcium Carbonate (CALCIUM 500 PO) Take 1 tablet by mouth daily.       . carisoprodol (SOMA) 250 MG tablet TAKE 1 TABLET BY MOUTH 3 TIMES A DAY  270 tablet  0  . clindamycin (CLEOCIN) 150 MG capsule Take 2 capsules by mouth 2 (two) times daily.      . clonazePAM (KLONOPIN) 1 MG tablet 1 tab po bid prn  180 tablet  1  . Fish Oil OIL Take 2 capsules by mouth daily.       . Fluticasone-Salmeterol (ADVAIR  DISKUS) 250-50 MCG/DOSE AEPB Inhale 1 puff into the lungs every 12 (twelve) hours.  60 each  6  . hyoscyamine (LEVSIN SL) 0.125 MG SL tablet Take 1-2 tablets (0.125-0.25 mg total) by mouth every 6 (six) hours as needed for cramping.  270 tablet  1  . MAGNESIUM PO Take 1 tablet by mouth daily. Unknown strength      . meloxicam (MOBIC) 7.5 MG tablet Take 1 tablet (7.5 mg total) by mouth daily.  90 tablet  1  . ondansetron (ZOFRAN-ODT) 8 MG disintegrating tablet Take 1 tablet (8 mg total) by mouth every 8 (eight) hours as needed. For nausea  270 tablet  1  . oxyCODONE (OXY IR/ROXICODONE) 5 MG immediate release tablet Take 1-2 tablets (5-10 mg total) by mouth every 4 (four) hours as needed.  300 tablet  0  . OxyCODONE HCl ER (OXYCONTIN) 60 MG T12A Take 1 tablet by mouth 2 (two) times daily.  60 each  0  . phenazopyridine (PYRIDIUM) 200 MG tablet Take 1 tablet (200 mg total) by mouth 3 (three) times daily as needed for pain.  90 tablet  2  . polyethylene glycol powder (GLYCOLAX/MIRALAX) powder DISSOLVE 1 CAPFUL INTO LIQUID AND TAKE BY MOUTH ONE TO THREE TIMES DAILY AS NEEDED FOR CONSTIPATION  1581 g  3  . pramipexole (MIRAPEX) 0.125 MG tablet 3 tabs po qhs  270 tablet  1  . predniSONE (DELTASONE) 20 MG tablet 2 tabs po qd x 5d  10 tablet  0  . promethazine (PHENERGAN) 25 MG tablet Take 25-50 mg by mouth every 6 (six) hours as needed.       . ranitidine (ZANTAC) 150 MG tablet Take 1 tablet (150 mg total) by mouth 2 (two) times daily.  180 tablet  1  . sucralfate (CARAFATE) 1 G tablet 1 tab about 1 hour prior to each meal prn      . SUMAtriptan (IMITREX) 50 MG tablet Take 2 tablets (100 mg total) by mouth every 2 (two) hours as needed (migraine HA (max of 200mg /24h).  27 tablet  1  . zolpidem (AMBIEN) 10 MG tablet Take 1 tablet (10 mg total) by mouth at bedtime.  30 tablet  5   No facility-administered medications prior to visit.    PE: Blood pressure 110/72, pulse 84, temperature 98.4 F (36.9 C),  temperature source Temporal, height 5' 4.5" (1.638 m), weight 185 lb (83.915 kg). Gen: Alert, well appearing.  Patient is oriented to person, place, time, and situation. AFFECT: pleasant, lucid thought and speech. No further exam today.  IMPRESSION  AND PLAN:  Asthma, moderate persistent Per her report of sx's and albut requirements, she still is under poor control.  Exam normal today, although she had albut about 1 hour prior to her o/v today. Discussed options with pt today and decided to increase advair 500/50, 1 puff bid.  Will also do another prednisone 40mg  qd x 5d burst.   Chronic headaches Continue current med regimen.  FEVER, RECURRENT No episodes in the last couple of months.  FIBROMYALGIA, SEVERE Symptoms wax/wane but are overall stable. Continue currrent treatment approach, pain med RFs today. We reviewed labs from last month: all rheum/inflamm labs unremarkable except a weakly positive ANA (1:40). A f/u anti dsDNA was neg. I reiterated to pt today that I don't think this + ANA is clinically relevant but we'll continue to monitor rheum labs periodically. She was ok with this.  I also encouraged her to make routine f/u visit with her rheumatologist, Dr. Consuella Lose, and we'll fax her latest labs to her office.  NAUSEA Chronic, without vomiting. GI w/u unrevealing. It seems that nearly round the clock antiemetics are the only thing we can do at this juncture, and she is definitely all for this approach. I think chronic state of nausea (+ chronic antiemetic use) is the cause of her hyponatremia. Over the last year her Na has ranged from 129-mid 130s.  We'll recheck this next month at f/u. Again, I made sure she is not taking any diuretics.  Basic diagnostic testing to r/o SIADH has been done in the past.   An After Visit Summary was printed and given to the patient.  FOLLOW UP: 40mo

## 2012-10-17 NOTE — Telephone Encounter (Signed)
RX faxed

## 2012-10-18 NOTE — Assessment & Plan Note (Signed)
Symptoms wax/wane but are overall stable. Continue currrent treatment approach, pain med RFs today. We reviewed labs from last month: all rheum/inflamm labs unremarkable except a weakly positive ANA (1:40). A f/u anti dsDNA was neg. I reiterated to pt today that I don't think this + ANA is clinically relevant but we'll continue to monitor rheum labs periodically. She was ok with this.  I also encouraged her to make routine f/u visit with her rheumatologist, Dr. Consuella Lose, and we'll fax her latest labs to her office.

## 2012-10-18 NOTE — Assessment & Plan Note (Addendum)
Chronic, without vomiting. GI w/u unrevealing. It seems that nearly round the clock antiemetics are the only thing we can do at this juncture, and she is definitely all for this approach. I think chronic state of nausea (+ chronic antiemetic use) is the cause of her hyponatremia. Over the last year her Na has ranged from 129-mid 130s.  We'll recheck this next month at f/u. Again, I made sure she is not taking any diuretics.  Basic diagnostic testing to r/o SIADH has been done in the past, but if Na falling at next check then will repeat this eval.

## 2012-10-18 NOTE — Assessment & Plan Note (Signed)
No episodes in the last couple of months.

## 2012-10-18 NOTE — Assessment & Plan Note (Signed)
Continue current med regimen

## 2012-10-18 NOTE — Assessment & Plan Note (Signed)
Per her report of sx's and albut requirements, she still is under poor control.  Exam normal today, although she had albut about 1 hour prior to her o/v today. Discussed options with pt today and decided to increase advair 500/50, 1 puff bid.  Will also do another prednisone 40mg  qd x 5d burst.

## 2012-11-17 ENCOUNTER — Encounter: Payer: Self-pay | Admitting: Family Medicine

## 2012-11-17 ENCOUNTER — Ambulatory Visit (INDEPENDENT_AMBULATORY_CARE_PROVIDER_SITE_OTHER): Payer: BC Managed Care – PPO | Admitting: Family Medicine

## 2012-11-17 VITALS — BP 120/80 | HR 109 | Temp 97.7°F | Ht 64.5 in | Wt 184.8 lb

## 2012-11-17 DIAGNOSIS — IMO0001 Reserved for inherently not codable concepts without codable children: Secondary | ICD-10-CM

## 2012-11-17 DIAGNOSIS — R5381 Other malaise: Secondary | ICD-10-CM

## 2012-11-17 DIAGNOSIS — J4540 Moderate persistent asthma, uncomplicated: Secondary | ICD-10-CM

## 2012-11-17 DIAGNOSIS — M2550 Pain in unspecified joint: Secondary | ICD-10-CM

## 2012-11-17 DIAGNOSIS — E042 Nontoxic multinodular goiter: Secondary | ICD-10-CM

## 2012-11-17 DIAGNOSIS — R635 Abnormal weight gain: Secondary | ICD-10-CM

## 2012-11-17 DIAGNOSIS — A689 Relapsing fever, unspecified: Secondary | ICD-10-CM

## 2012-11-17 DIAGNOSIS — M797 Fibromyalgia: Secondary | ICD-10-CM

## 2012-11-17 DIAGNOSIS — J45909 Unspecified asthma, uncomplicated: Secondary | ICD-10-CM

## 2012-11-17 MED ORDER — OXYCODONE HCL 5 MG PO TABS: 5.0000 mg | ORAL_TABLET | ORAL | Status: DC | PRN

## 2012-11-17 MED ORDER — OXYCODONE HCL ER 60 MG PO T12A: 1.0000 | EXTENDED_RELEASE_TABLET | Freq: Two times a day (BID) | ORAL | Status: DC

## 2012-11-17 NOTE — Progress Notes (Signed)
OFFICE NOTE  11/17/2012  CC:  Chief Complaint  Patient presents with  . Follow-up    Fibromyalgia     HPI: Patient is a 47 y.o. Caucasian female who is here for routine f/u severe fibromyalgia, chronic migraine HAs, chronic high risk med use. Says asthma feels better since the changes we made last f/u visit.   Fibro pain and HA's are the same--essentially hurts everywhere but has no joint swelling or redness or rash.  Has pretty much daily tension HA, with a migriane 2-3 times per week. Has had 1 febrile "episode" (up to 103.2) since I last saw her: sx's matched her well-described, well-documented episodes of the past that have been fully worked-up w/out success.  She has lateral hip pain that she needs to f/u with Dr. Consuella Lose for soon, also wants to get her opinion on the labs that we discussed last visit (ANA pos 1:40, speckled pattern).  Pertinent PMH:  Past Medical History  Diagnosis Date  . DDD (degenerative disc disease), lumbar   . Atypical chest pain     Question of coronary vasospasm; cath 10/20/10 NORMAL  . Anxiety   . Fibromyalgia     Dr. Hope Pigeon [phone]  . Interstitial cystitis   . Obesity   . Insomnia   . Transaminasemia 03/2010    (ALT 49), normal on f/u testing off of daily tylenol uuse.  Marland Kitchen GERD (gastroesophageal reflux disease) 06/01/10    Dyspepsia/gastritis, EGD 05/2010: gastritis (NSAIDS)  . Rectal bleeding 06/01/10    Anorectal bleeding/BRBPR--colonoscopy: internal hemorrhoids  . Recurrent UTI     renal u/s normal 07/2007  . Weight loss     CT abd/pelf 08/22/10 remakable only for bile duct 9mm (unchanged from 03/2004 PRIOR to ERCP w/spincterotomy, bile duct balloon dredging and lap chole)  . Recurrent fever     W/u unrevealing, including ID consult.    . Asthma   . Osteopenia     bone densitometry 2006 per pt  . 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 Christella Hartigan  . Multinodular goiter 11/2010    TSH normal  . Chest  pain   . Rectal prolapse 02/2011    repaired lap [id number]  . Vitamin D deficiency 08/20/2011  . History of migraine headaches     Dr. Anne Hahn at [facility] 03/2012: started nortriptyline 10mg --titrating to 3 caps qhs  . Edema 10/31/2011  . Lobar pneumonia mid May, 2013    RML and RLL, with sepsis--admission to [facility] 12/2011--responded extremely well to rocephin + zithromax.  . IBS (irritable bowel syndrome)   . De Quervain's tenosynovitis, left 11/11/2011  . Anemia, iron deficiency 06/14/2011    Hemoccults NEG x 3 in December 2012     MEDS:  Outpatient Prescriptions Prior to Visit  Medication Sig Dispense Refill  . albuterol (PROAIR HFA) 108 (90 BASE) MCG/ACT inhaler Inhale 2 puffs into the lungs every 4 (four) hours as needed. Shortness of breath  1 Inhaler  1  . amLODipine (NORVASC) 5 MG tablet Take 1 tablet (5 mg total) by mouth daily.  90 tablet  1  . Calcium Carbonate (CALCIUM 500 PO) Take 1 tablet by mouth daily.       . carisoprodol (SOMA) 250 MG tablet TAKE 1 TABLET BY MOUTH 3 TIMES A DAY  270 tablet  0  . clonazePAM (KLONOPIN) 1 MG tablet 1 tab po bid prn  180 tablet  1  . Fish Oil OIL Take 2 capsules by mouth daily.       Marland Kitchen  Fluticasone-Salmeterol (ADVAIR) 500-50 MCG/DOSE AEPB Inhale 1 puff into the lungs 2 (two) times daily.  60 each  6  . hyoscyamine (LEVSIN SL) 0.125 MG SL tablet Take 1-2 tablets (0.125-0.25 mg total) by mouth every 6 (six) hours as needed for cramping.  270 tablet  1  . MAGNESIUM PO Take 1 tablet by mouth daily. Unknown strength      . meloxicam (MOBIC) 7.5 MG tablet Take 1 tablet (7.5 mg total) by mouth daily.  90 tablet  1  . ondansetron (ZOFRAN-ODT) 8 MG disintegrating tablet Take 1 tablet (8 mg total) by mouth every 8 (eight) hours as needed. For nausea  270 tablet  1  . oxyCODONE (OXY IR/ROXICODONE) 5 MG immediate release tablet Take 1-2 tablets (5-10 mg total) by mouth every 4 (four) hours as needed.  300 tablet  0  . OxyCODONE HCl ER (OXYCONTIN) 60 MG T12A Take  1 tablet by mouth 2 (two) times daily.  60 each  0  . phenazopyridine (PYRIDIUM) 200 MG tablet Take 1 tablet (200 mg total) by mouth 3 (three) times daily as needed for pain.  90 tablet  2  . polyethylene glycol powder (GLYCOLAX/MIRALAX) powder DISSOLVE 1 CAPFUL INTO LIQUID AND TAKE BY MOUTH ONE TO THREE TIMES DAILY AS NEEDED FOR CONSTIPATION  1581 g  3  . pramipexole (MIRAPEX) 0.125 MG tablet 3 tabs po qhs  270 tablet  1  . predniSONE (DELTASONE) 20 MG tablet 2 tabs po qd x 5d  10 tablet  0  . promethazine (PHENERGAN) 25 MG tablet Take 25-50 mg by mouth every 6 (six) hours as needed.       . ranitidine (ZANTAC) 150 MG tablet Take 1 tablet (150 mg total) by mouth 2 (two) times daily.  180 tablet  1  . sucralfate (CARAFATE) 1 G tablet 1 tab about 1 hour prior to each meal prn      . SUMAtriptan (IMITREX) 50 MG tablet Take 2 tablets (100 mg total) by mouth every 2 (two) hours as needed (migraine HA (max of 200mg /24h).  27 tablet  1  . zolpidem (AMBIEN) 10 MG tablet Take 1 tablet (10 mg total) by mouth at bedtime.  30 tablet  5  . clindamycin (CLEOCIN) 150 MG capsule Take 2 capsules by mouth 2 (two) times daily.       No facility-administered medications prior to visit.    PE: Blood pressure 120/80, pulse 109, temperature 97.7 F (36.5 C), temperature source Oral, height 5' 4.5" (1.638 m), weight 184 lb 12 oz (83.802 kg), SpO2 94.00%. Gen: Alert, well appearing.  Patient is oriented to person, place, time, and situation. CV: RRR, no m/r/g.   LUNGS: CTA bilat, nonlabored resps, good aeration in all lung fields.  IMPRESSION AND PLAN: 1) Fibromyalgia, severe--no changes. Continue current med regimen.  Pain meds RF'd today.  Will do UDS next f/u visit. 2) Asthma: improved.  Continue advair 500/50, use albut q6h prn. 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  Pt in favor of continuing current tx approach. 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  No new  clues.  No new w/u at this time. No labs today but will likely do some general f/u rheum labs next f/u visit.  FOLLOW UP: 1 mo

## 2012-12-17 ENCOUNTER — Ambulatory Visit: Payer: BC Managed Care – PPO | Admitting: Family Medicine

## 2012-12-18 ENCOUNTER — Ambulatory Visit (INDEPENDENT_AMBULATORY_CARE_PROVIDER_SITE_OTHER): Payer: BC Managed Care – PPO | Admitting: Family Medicine

## 2012-12-18 ENCOUNTER — Encounter: Payer: Self-pay | Admitting: Family Medicine

## 2012-12-18 VITALS — BP 114/77 | HR 94 | Temp 98.0°F | Resp 16 | Wt 186.8 lb

## 2012-12-18 DIAGNOSIS — E042 Nontoxic multinodular goiter: Secondary | ICD-10-CM

## 2012-12-18 DIAGNOSIS — R894 Abnormal immunological findings in specimens from other organs, systems and tissues: Secondary | ICD-10-CM

## 2012-12-18 DIAGNOSIS — J45909 Unspecified asthma, uncomplicated: Secondary | ICD-10-CM

## 2012-12-18 DIAGNOSIS — Z23 Encounter for immunization: Secondary | ICD-10-CM

## 2012-12-18 DIAGNOSIS — R768 Other specified abnormal immunological findings in serum: Secondary | ICD-10-CM

## 2012-12-18 DIAGNOSIS — R7689 Other specified abnormal immunological findings in serum: Secondary | ICD-10-CM

## 2012-12-18 DIAGNOSIS — R5383 Other fatigue: Secondary | ICD-10-CM

## 2012-12-18 DIAGNOSIS — J4540 Moderate persistent asthma, uncomplicated: Secondary | ICD-10-CM

## 2012-12-18 DIAGNOSIS — IMO0001 Reserved for inherently not codable concepts without codable children: Secondary | ICD-10-CM

## 2012-12-18 DIAGNOSIS — Z79899 Other long term (current) drug therapy: Secondary | ICD-10-CM

## 2012-12-18 DIAGNOSIS — R635 Abnormal weight gain: Secondary | ICD-10-CM

## 2012-12-18 DIAGNOSIS — M2550 Pain in unspecified joint: Secondary | ICD-10-CM

## 2012-12-18 DIAGNOSIS — M797 Fibromyalgia: Secondary | ICD-10-CM

## 2012-12-18 DIAGNOSIS — A689 Relapsing fever, unspecified: Secondary | ICD-10-CM

## 2012-12-18 LAB — COMPREHENSIVE METABOLIC PANEL
Albumin: 4.3 g/dL (ref 3.5–5.2)
BUN: 5 mg/dL — ABNORMAL LOW (ref 6–23)
CO2: 33 mEq/L — ABNORMAL HIGH (ref 19–32)
Calcium: 9.2 mg/dL (ref 8.4–10.5)
Chloride: 92 mEq/L — ABNORMAL LOW (ref 96–112)
Creatinine, Ser: 0.9 mg/dL (ref 0.4–1.2)
GFR: 72.88 mL/min (ref >60.00)
Potassium: 3.8 mEq/L (ref 3.5–5.1)

## 2012-12-18 LAB — CBC WITH DIFFERENTIAL/PLATELET
Basophils Relative: 0.6 % (ref 0.0–3.0)
Eosinophils Relative: 5.2 % — ABNORMAL HIGH (ref 0.0–5.0)
Hemoglobin: 12.8 g/dL (ref 12.0–15.0)
Lymphocytes Relative: 14.1 % (ref 12.0–46.0)
Monocytes Relative: 6 % (ref 3.0–12.0)
Neutro Abs: 5.9 10*3/uL (ref 1.4–7.7)
Neutrophils Relative %: 74.1 % (ref 43.0–77.0)
RBC: 4.38 Mil/uL (ref 3.87–5.11)
WBC: 7.9 10*3/uL (ref 4.5–10.5)

## 2012-12-18 MED ORDER — RANITIDINE HCL 150 MG PO TABS: 150.0000 mg | ORAL_TABLET | Freq: Two times a day (BID) | ORAL | Status: DC

## 2012-12-18 MED ORDER — OXYCODONE HCL ER 60 MG PO T12A: 1.0000 | EXTENDED_RELEASE_TABLET | Freq: Two times a day (BID) | ORAL | Status: DC

## 2012-12-18 MED ORDER — OXYCODONE HCL 5 MG PO TABS: 5.0000 mg | ORAL_TABLET | ORAL | Status: DC | PRN

## 2012-12-18 NOTE — Progress Notes (Signed)
OFFICE NOTE  12/18/2012  CC:  Chief Complaint  Patient presents with  . Follow-up    1-mth. [Chronic Medical issues]     HPI: Patient is a 47 y.o. Caucasian female who is here for 1 mo f/u chronic pain syndrome/severe fibromyalgia, chronic headaches, asthma. Says allergies have been bothering her more lately and as a consequence she has had more headaches than even her usual and has felt chest tightness and some wheezing.  Has required albuterol several times over the last month--none today.  She takes OTC cetirizine some days for allergies but nothing else.  With increased HA's lately she has had increased nausea and need for her nausea meds. She says it has been "a rougher month" regarding her body pain, esp in shoulders, back, and hips.   She is hopeful that Dr. Jon Billings may be able to inject her hips at a f/u visit in the near future.  Pt says her level of functioning is unchanged; she can do about 15 min of light housework and then must rest.  She uses ice alt/w heat regularly as adjunctive pain treatment.  Also uses topical compounded anti-inflammatory cream to some focal areas of muscle/soft tissue pain and says this helps some. She has had one febrile "episode" since I last saw her.  No oral ulcers, no swelling or redness of any joints.  No LE swelling.  Last (most recent) dose of clonaz was last night, last dose of oxycodone was last night, last dose of oxycontin was this morning.  No new complaints.  Pertinent PMH:  Past Medical History  Diagnosis Date  . DDD (degenerative disc disease), lumbar   . Atypical chest pain     Question of coronary vasospasm; cath 10/20/10 NORMAL  . Anxiety   . Fibromyalgia     Dr. Hope Pigeon [phone]  . Interstitial cystitis   . Obesity   . Insomnia   . Transaminasemia 03/2010    (ALT 49), normal on f/u testing off of daily tylenol uuse.  Marland Kitchen GERD (gastroesophageal reflux disease) 06/01/10    Dyspepsia/gastritis, EGD 05/2010: gastritis  (NSAIDS)  . Rectal bleeding 06/01/10    Anorectal bleeding/BRBPR--colonoscopy: internal hemorrhoids  . Recurrent UTI     renal u/s normal 07/2007  . Weight loss     CT abd/pelf 08/22/10 remakable only for bile duct 9mm (unchanged from 03/2004 PRIOR to ERCP w/spincterotomy, bile duct balloon dredging and lap chole)  . Recurrent fever     W/u unrevealing, including ID consult.    . Asthma   . Osteopenia     bone densitometry 2006 per pt  . 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 Christella Hartigan  . Multinodular goiter 11/2010    TSH normal  . Chest pain   . Rectal prolapse 02/2011    repaired lap [id number]  . Vitamin D deficiency 08/20/2011  . History of migraine headaches     Dr. Anne Hahn at [facility] 03/2012: started nortriptyline 10mg --titrating to 3 caps qhs  . Edema 10/31/2011  . Lobar pneumonia mid May, 2013    RML and RLL, with sepsis--admission to [facility] 12/2011--responded extremely well to rocephin + zithromax.  . IBS (irritable bowel syndrome)   . De Quervain's tenosynovitis, left 11/11/2011  . Anemia, iron deficiency 06/14/2011    Hemoccults NEG x 3 in December 2012    Past surgical, social, and family history reviewed and no changes noted since last office visit.  MEDS:  Outpatient Prescriptions Prior to Visit  Medication Sig Dispense Refill  . albuterol (PROAIR HFA) 108 (90 BASE) MCG/ACT inhaler Inhale 2 puffs into the lungs every 4 (four) hours as needed. Shortness of breath  1 Inhaler  1  . amLODipine (NORVASC) 5 MG tablet Take 1 tablet (5 mg total) by mouth daily.  90 tablet  1  . Calcium Carbonate (CALCIUM 500 PO) Take 1 tablet by mouth daily.       . carisoprodol (SOMA) 250 MG tablet TAKE 1 TABLET BY MOUTH 3 TIMES A DAY  270 tablet  0  . clonazePAM (KLONOPIN) 1 MG tablet 1 tab po bid prn  180 tablet  1  . Fish Oil OIL Take 2 capsules by mouth daily.       . Fluticasone-Salmeterol (ADVAIR) 500-50 MCG/DOSE AEPB Inhale 1 puff into the lungs 2 (two) times daily.   60 each  6  . hyoscyamine (LEVSIN SL) 0.125 MG SL tablet Take 1-2 tablets (0.125-0.25 mg total) by mouth every 6 (six) hours as needed for cramping.  270 tablet  1  . MAGNESIUM PO Take 1 tablet by mouth daily. Unknown strength      . meloxicam (MOBIC) 7.5 MG tablet Take 1 tablet (7.5 mg total) by mouth daily.  90 tablet  1  . ondansetron (ZOFRAN-ODT) 8 MG disintegrating tablet Take 1 tablet (8 mg total) by mouth every 8 (eight) hours as needed. For nausea  270 tablet  1  . phenazopyridine (PYRIDIUM) 200 MG tablet Take 1 tablet (200 mg total) by mouth 3 (three) times daily as needed for pain.  90 tablet  2  . polyethylene glycol powder (GLYCOLAX/MIRALAX) powder DISSOLVE 1 CAPFUL INTO LIQUID AND TAKE BY MOUTH ONE TO THREE TIMES DAILY AS NEEDED FOR CONSTIPATION  1581 g  3  . pramipexole (MIRAPEX) 0.125 MG tablet 3 tabs po qhs  270 tablet  1  . promethazine (PHENERGAN) 25 MG tablet Take 25-50 mg by mouth every 6 (six) hours as needed.       . sucralfate (CARAFATE) 1 G tablet 1 tab about 1 hour prior to each meal prn      . SUMAtriptan (IMITREX) 50 MG tablet Take 2 tablets (100 mg total) by mouth every 2 (two) hours as needed (migraine HA (max of 200mg /24h).  27 tablet  1  . zolpidem (AMBIEN) 10 MG tablet Take 1 tablet (10 mg total) by mouth at bedtime.  30 tablet  5  . oxyCODONE (OXY IR/ROXICODONE) 5 MG immediate release tablet Take 1-2 tablets (5-10 mg total) by mouth every 4 (four) hours as needed.  300 tablet  0  . OxyCODONE HCl ER (OXYCONTIN) 60 MG T12A Take 1 tablet by mouth 2 (two) times daily.  60 each  0  . predniSONE (DELTASONE) 20 MG tablet 2 tabs po qd x 5d  10 tablet  0  . ranitidine (ZANTAC) 150 MG tablet Take 1 tablet (150 mg total) by mouth 2 (two) times daily.  180 tablet  1  . clindamycin (CLEOCIN) 150 MG capsule Take 2 capsules by mouth 2 (two) times daily.       No facility-administered medications prior to visit.  Not on clindamycin or prednisone as listed above.  PE: Blood  pressure 114/77, pulse 94, temperature 98 F (36.7 C), temperature source Oral, resp. rate 16, weight 186 lb 12 oz (84.709 kg), SpO2 96.00%. Gen: Alert, well appearing.  Patient is oriented to person, place, time, and situation. MOUTH: no ulcers or focal lesions Neck - No masses  or thyromegaly or limitation in range of motion CV: RRR, no m/r/g.   LUNGS: CTA bilat, nonlabored resps, good aeration in all lung fields. EXT: no clubbing, cyanosis, or edema.  MUSC: she is tender in every single portion of her body, peripherally>centrally.  No joint swelling, warmth, or erythema. Skin - no sores or suspicious lesions or rashes or color changes   IMPRESSION AND PLAN:  1) Chronic pain syndrome/fibromyalgia syndrome: stable.  Still no objective signs of inflammatory disorder.   As per our longterm plan, we will check some general labs today to continue active surveillance for abnormalities that may suggest alternative dx.  RF'd pain meds toda.  UDS obtained today, results pending.  2) Asthma, mod persistent: I still feel like she's pretty stable and will continue current regimen.  3) Allergic rhinitis: encouraged pt to use OTC zyrtec daily and start OTC nasocort daily.  4) Chronic HA's (tension+ migraines): a bit worse lately but there are no management changes to make.  She seems to have made up her mind that [facility] is not a place she'll go back to b/c she feels like they focused too much on trying to get her to take tricyclic antidepressants and "other meds like that" that she can't recall names of today.    5) hx of hyponatremia: unclear etiology.  ? Chronic antiemetic use?  Will monitor this again today.  Have already been discouraging pt from over-ingestion of free water.  6) Preventative health care: Tdap today (pt cannot recall when her last tetanus booster was and cannot recall if she has ever had Tdap).  An After Visit Summary was printed and given to the patient.  FOLLOW  UP: 89mo

## 2012-12-19 LAB — PRESCRIPTION ABUSE MONITORING 10P, URINE
Cocaine Metabolites: NEGATIVE ng/mL
Creatinine, Urine: 183.48 mg/dL (ref >20.0)
Propoxyphene: NEGATIVE ng/mL

## 2012-12-19 LAB — ANA: Anti Nuclear Antibody(ANA): NEGATIVE

## 2012-12-24 LAB — OPIATES/OPIOIDS (LC/MS-MS)
Morphine Urine: NEGATIVE ng/mL
Norhydrocodone, Ur: NEGATIVE ng/mL
Oxycodone, ur: >10000 ng/mL
Oxymorphone: 6115 ng/mL

## 2012-12-24 LAB — OXYCODONE, URINE (LC/MS-MS)
Noroxycodone, Ur: >10000 ng/mL
Oxycodone, ur: >10000 ng/mL
Oxymorphone: 6115 ng/mL

## 2012-12-24 LAB — BENZODIAZEPINES (GC/LC/MS), URINE
Alprazolam (GC/LC/MS), ur confirm: NEGATIVE ng/mL
Alprazolam metabolite (GC/LC/MS), ur confirm: NEGATIVE ng/mL
Clonazepam metabolite (GC/LC/MS), ur confirm: 420 ng/mL
Lorazepam (GC/LC/MS), ur confirm: NEGATIVE ng/mL
Oxazepam (GC/LC/MS), ur confirm: NEGATIVE ng/mL

## 2012-12-24 LAB — AMPHETAMINES (GC/LC/MS), URINE
MDA GC/MS confirm: NEGATIVE ng/mL
Methamphetamine Quant, Ur: NEGATIVE ng/mL

## 2013-01-07 ENCOUNTER — Other Ambulatory Visit: Payer: Self-pay | Admitting: *Deleted

## 2013-01-07 MED ORDER — PRAMIPEXOLE DIHYDROCHLORIDE 0.125 MG PO TABS: ORAL_TABLET | ORAL | Status: DC

## 2013-01-07 NOTE — Telephone Encounter (Signed)
Patient informed, requested to [facility] pharmacy in [place]; Rx request to pharmacy/SLS

## 2013-01-07 NOTE — Telephone Encounter (Signed)
I authorize #[id number], with 1 additional RF but pls clarify with her which pharmacy she wants this sent to b/c I am always confused with her meds when it comes to which pharmacy she wants to use.-thx

## 2013-01-07 NOTE — Telephone Encounter (Signed)
Pt request refill for Mirapex, states she is "completely out of medication", Last 09.18.13 #[id number]; Last OV 05.15.14/SLS Please advise.

## 2013-01-14 ENCOUNTER — Encounter: Payer: Self-pay | Admitting: Family Medicine

## 2013-01-14 ENCOUNTER — Ambulatory Visit (INDEPENDENT_AMBULATORY_CARE_PROVIDER_SITE_OTHER): Payer: BC Managed Care – PPO | Admitting: Family Medicine

## 2013-01-14 VITALS — BP 121/76 | HR 88 | Temp 98.2°F | Resp 14 | Wt 190.8 lb

## 2013-01-14 DIAGNOSIS — M797 Fibromyalgia: Secondary | ICD-10-CM

## 2013-01-14 DIAGNOSIS — IMO0001 Reserved for inherently not codable concepts without codable children: Secondary | ICD-10-CM

## 2013-01-14 DIAGNOSIS — R5383 Other fatigue: Secondary | ICD-10-CM

## 2013-01-14 DIAGNOSIS — R635 Abnormal weight gain: Secondary | ICD-10-CM

## 2013-01-14 DIAGNOSIS — R5381 Other malaise: Secondary | ICD-10-CM

## 2013-01-14 DIAGNOSIS — J45909 Unspecified asthma, uncomplicated: Secondary | ICD-10-CM

## 2013-01-14 DIAGNOSIS — E042 Nontoxic multinodular goiter: Secondary | ICD-10-CM

## 2013-01-14 DIAGNOSIS — R51 Headache: Secondary | ICD-10-CM

## 2013-01-14 DIAGNOSIS — M2550 Pain in unspecified joint: Secondary | ICD-10-CM

## 2013-01-14 DIAGNOSIS — A689 Relapsing fever, unspecified: Secondary | ICD-10-CM

## 2013-01-14 DIAGNOSIS — J4540 Moderate persistent asthma, uncomplicated: Secondary | ICD-10-CM

## 2013-01-14 DIAGNOSIS — R519 Headache, unspecified: Secondary | ICD-10-CM

## 2013-01-14 MED ORDER — OXYCODONE HCL 5 MG PO TABS: 5.0000 mg | ORAL_TABLET | ORAL | Status: DC | PRN

## 2013-01-14 MED ORDER — CARISOPRODOL 250 MG PO TABS: ORAL_TABLET | ORAL | Status: DC

## 2013-01-14 MED ORDER — PREDNISONE 20 MG PO TABS: ORAL_TABLET | ORAL | Status: DC

## 2013-01-14 MED ORDER — OXYCODONE HCL ER 60 MG PO T12A: 1.0000 | EXTENDED_RELEASE_TABLET | Freq: Two times a day (BID) | ORAL | Status: DC

## 2013-01-14 MED ORDER — MONTELUKAST SODIUM 10 MG PO TABS: 10.0000 mg | ORAL_TABLET | Freq: Every day | ORAL | Status: DC

## 2013-01-14 NOTE — Progress Notes (Signed)
OFFICE NOTE  01/14/2013  CC:  Chief Complaint  Patient presents with  . Follow-up    1-mth [Chronic Pain Syndrome]     HPI: Patient is a 47 y.o. Caucasian female who is here for 1 mo f/u for chronic pain syndrome/severe fibromyalgia, chronic mixed HA syndrome, hx of chronic nausea, long term high risk medication use. F/u with Dr. Corliss Skains since last visit resulted in one trigger point injection in each upper trapezius region.  This helped moderately and she wants to return later and get hip (s) injection. Overall the remainder of her soft tissue pain and arthralgias are unchanged: still severe pain constantly that is brought down to bearable levels by her pain meds.  UDS last visit had appropriate results.  Asthma worse lately: worse at night but also present in daytime, outside more lately, using rescue inhaler at least once daily.  She has been compliant with her advair 500/50, 1 puff bid.  No fever episodes since I last saw her.  Says HAs have been "pretty bad" lately: tension HA daily--wakes up with one most days.  Migraine about 3X/week.   No signif changes in overall eating or sleeping pattern.    ROS: no rash, no CP, no productive cough, no joint swelling.  Pertinent PMH:  Past Medical History  Diagnosis Date  . DDD (degenerative disc disease), lumbar   . Atypical chest pain     Question of coronary vasospasm; cath 10/20/10 NORMAL  . Anxiety   . Fibromyalgia     Dr. Hope Pigeon [phone]  . Interstitial cystitis   . Obesity   . Insomnia   . Transaminasemia 03/2010    (ALT 49), normal on f/u testing off of daily tylenol uuse.  Marland Kitchen GERD (gastroesophageal reflux disease) 06/01/10    Dyspepsia/gastritis, EGD 05/2010: gastritis (NSAIDS)  . Rectal bleeding 06/01/10    Anorectal bleeding/BRBPR--colonoscopy: internal hemorrhoids  . Recurrent UTI     renal u/s normal 07/2007  . Weight loss     CT abd/pelf 08/22/10 remakable only for bile duct 9mm (unchanged from 03/2004 PRIOR  to ERCP w/spincterotomy, bile duct balloon dredging and lap chole)  . Recurrent fever     W/u unrevealing, including ID consult.    . Asthma   . Osteopenia     bone densitometry 2006 per pt  . 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 Christella Hartigan  . Multinodular goiter 11/2010    TSH normal  . Chest pain   . Rectal prolapse 02/2011    repaired lap [id number]  . Vitamin D deficiency 08/20/2011  . History of migraine headaches     Dr. Anne Hahn at [facility] 03/2012: started nortriptyline 10mg --titrating to 3 caps qhs  . Edema 10/31/2011  . Lobar pneumonia mid May, 2013    RML and RLL, with sepsis--admission to [facility] 12/2011--responded extremely well to rocephin + zithromax.  . IBS (irritable bowel syndrome)   . De Quervain's tenosynovitis, left 11/11/2011  . Anemia, iron deficiency 06/14/2011    Hemoccults NEG x 3 in December 2012    Past surgical, social, and family history reviewed and no changes noted since last office visit except that she informed me today that she has now been granted medical disability status.  MEDS:  Outpatient Prescriptions Prior to Visit  Medication Sig Dispense Refill  . albuterol (PROAIR HFA) 108 (90 BASE) MCG/ACT inhaler Inhale 2 puffs into the lungs every 4 (four) hours as needed. Shortness of breath  1 Inhaler  1  .  amLODipine (NORVASC) 5 MG tablet Take 1 tablet (5 mg total) by mouth daily.  90 tablet  1  . Calcium Carbonate (CALCIUM 500 PO) Take 1 tablet by mouth daily.       . carisoprodol (SOMA) 250 MG tablet TAKE 1 TABLET BY MOUTH 3 TIMES A DAY  270 tablet  0  . clonazePAM (KLONOPIN) 1 MG tablet 1 tab po bid prn  180 tablet  1  . Fish Oil OIL Take 2 capsules by mouth daily.       . Fluticasone-Salmeterol (ADVAIR) 500-50 MCG/DOSE AEPB Inhale 1 puff into the lungs 2 (two) times daily.  60 each  6  . hyoscyamine (LEVSIN SL) 0.125 MG SL tablet Take 1-2 tablets (0.125-0.25 mg total) by mouth every 6 (six) hours as needed for cramping.  270 tablet   1  . MAGNESIUM PO Take 1 tablet by mouth daily. Unknown strength      . meloxicam (MOBIC) 7.5 MG tablet Take 1 tablet (7.5 mg total) by mouth daily.  90 tablet  1  . ondansetron (ZOFRAN-ODT) 8 MG disintegrating tablet Take 1 tablet (8 mg total) by mouth every 8 (eight) hours as needed. For nausea  270 tablet  1  . oxyCODONE (OXY IR/ROXICODONE) 5 MG immediate release tablet Take 1-2 tablets (5-10 mg total) by mouth every 4 (four) hours as needed.  300 tablet  0  . OxyCODONE HCl ER (OXYCONTIN) 60 MG T12A Take 1 tablet by mouth 2 (two) times daily.  60 each  0  . phenazopyridine (PYRIDIUM) 200 MG tablet Take 1 tablet (200 mg total) by mouth 3 (three) times daily as needed for pain.  90 tablet  2  . polyethylene glycol powder (GLYCOLAX/MIRALAX) powder DISSOLVE 1 CAPFUL INTO LIQUID AND TAKE BY MOUTH ONE TO THREE TIMES DAILY AS NEEDED FOR CONSTIPATION  1581 g  3  . pramipexole (MIRAPEX) 0.125 MG tablet Take 3 tabs po qhs  270 tablet  1  . promethazine (PHENERGAN) 25 MG tablet Take 25-50 mg by mouth every 6 (six) hours as needed.       . ranitidine (ZANTAC) 150 MG tablet Take 1 tablet (150 mg total) by mouth 2 (two) times daily.  180 tablet  2  . sucralfate (CARAFATE) 1 G tablet 1 tab about 1 hour prior to each meal prn      . SUMAtriptan (IMITREX) 50 MG tablet Take 2 tablets (100 mg total) by mouth every 2 (two) hours as needed (migraine HA (max of 200mg /24h).  27 tablet  1  . zolpidem (AMBIEN) 10 MG tablet Take 1 tablet (10 mg total) by mouth at bedtime.  30 tablet  5   No facility-administered medications prior to visit.  Zyrtec 10mg  qd  PE: Blood pressure 121/76, pulse 88, temperature 98.2 F (36.8 C), temperature source Oral, resp. rate 14, weight 190 lb 12 oz (86.524 kg), SpO2 97.00%. Wt is up 4 lbs from last o/v. Gen: Alert, well appearing.  Patient is oriented to person, place, time, and situation. AFFECT: pleasant, lucid thought and speech. CV: RRR, no m/r/g.   LUNGS: CTA bilat, nonlabored  resps, good aeration in all lung fields.  Exp phase not prolonged. EXT: no clubbing, cyanosis, or edema.   LABS:  None today  IMPRESSION AND PLAN:  FIBROMYALGIA, SEVERE Control unchanged, overall pretty poor but no med changes made. She seems to be in a holding pattern regarding management of this condition. RF'd pain meds today.    FEVER, RECURRENT Unknown  etiology. Extensive w/u unrevealing. Fortunately these febrile episodes have nearly become quiescent.   Asthma, moderate persistent Not in acute flare but describes poor control. Will give prednisone 20mg  qd x 5d, then 10mg  qd x 6d.   Also, will add 10mg  singulair qhs today.   Chronic headaches Mostly tension type but a fair amount of more debilitating migraine HA's w/out aura. Continue current treatments.  She has been followod by HA clinic in [place] but it seems like her f/u with them has fallen by the wayside.  Chronic fatigue Likely has chronic fatigue syndrome. A long list of dx have been searched for and nothing has been turned up to explain this. Quality of sleep Questionnaire today score was 3 today, which is low risk for OSA. No sleep study will be pursued at this time.   An After Visit Summary was printed and given to the patient.  FOLLOW UP: 45mo

## 2013-01-15 ENCOUNTER — Ambulatory Visit: Payer: BC Managed Care – PPO | Admitting: Family Medicine

## 2013-01-16 ENCOUNTER — Ambulatory Visit: Payer: BC Managed Care – PPO | Admitting: Family Medicine

## 2013-01-31 IMAGING — CR DG CHEST 1V PORT
1 series · 1 of 1 positions shown · non-contrast
Comparison: Chest and rib films 05/28/2011.

CLINICAL DATA: Fever.  Abdominal pain and chills.

PORTABLE CHEST - 1 VIEW

[view not recorded]
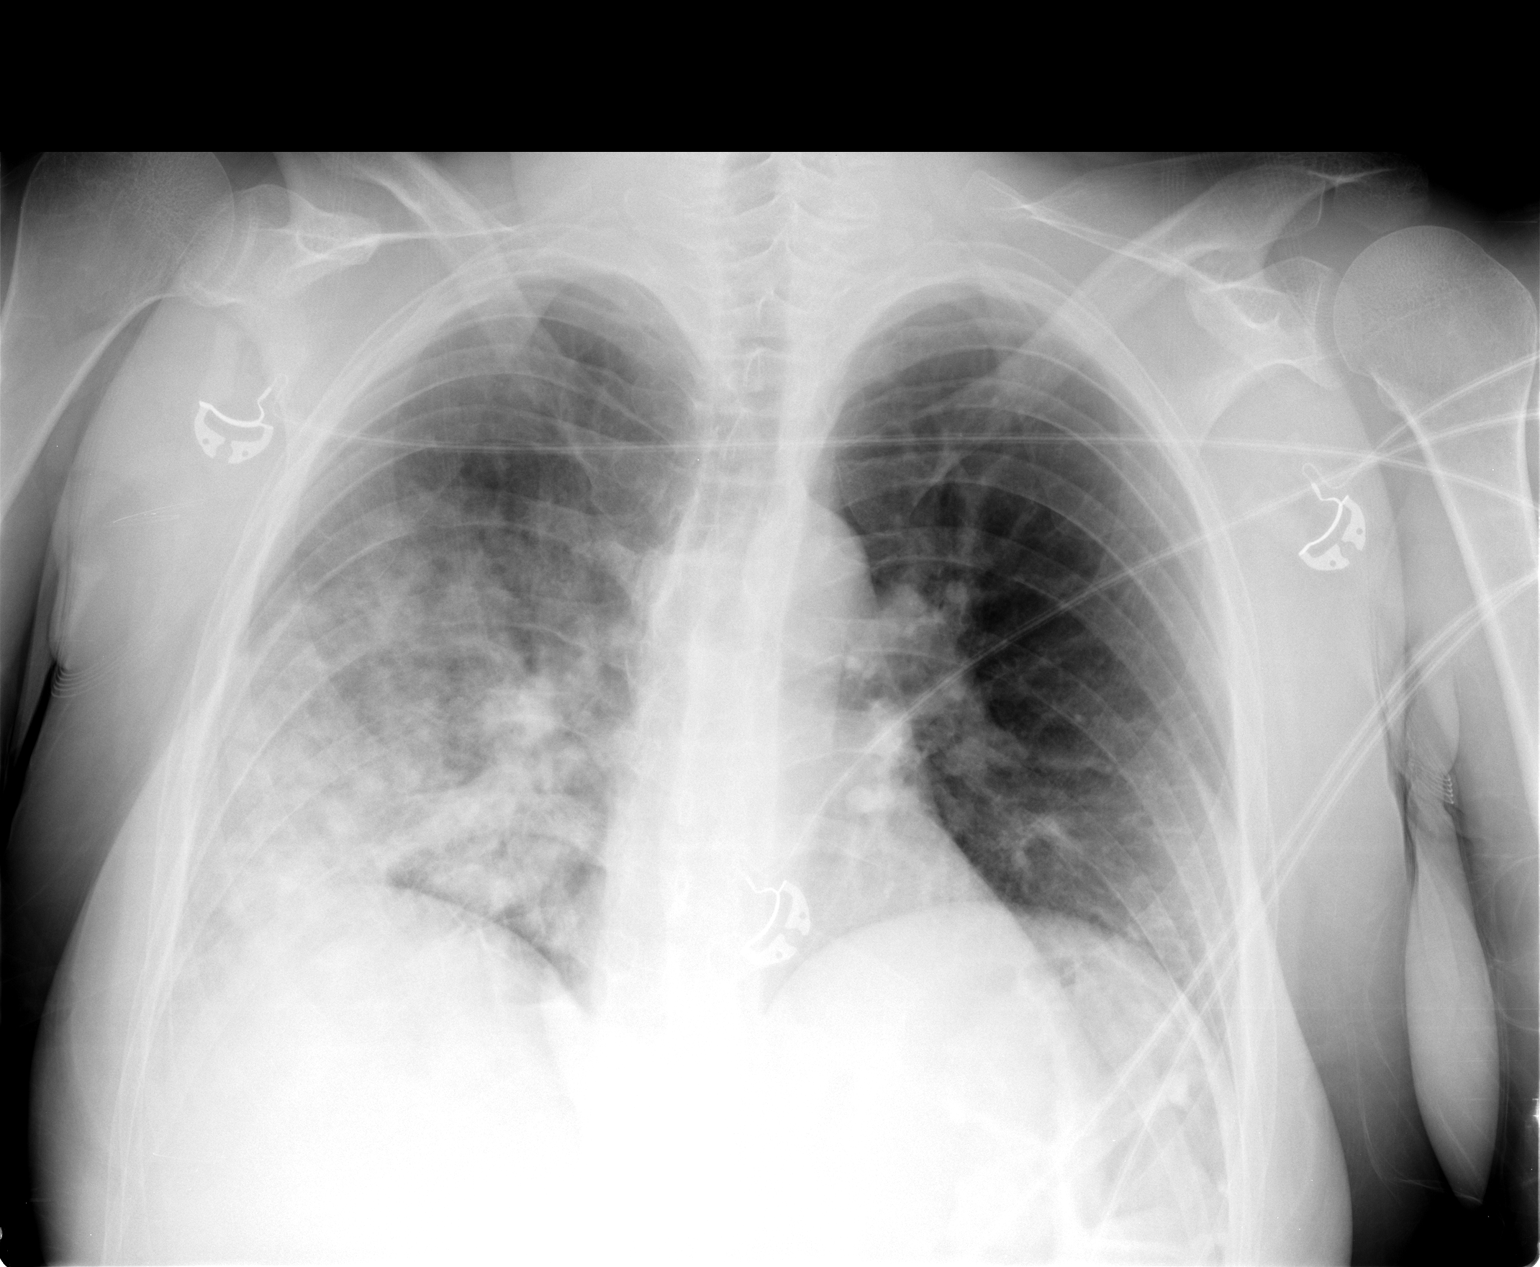

[1 of 1 positions shown; findings below may reference images not displayed]

FINDINGS: The heart size is normal.  A new right lower lobe
pneumonia is present.  Minimal airspace disease at the left base
may represent atelectasis or pneumonia.  The visualized soft
tissues and bony thorax are unremarkable.
IMPRESSION: 1.  Right lower lobe pneumonia.
2.  Minimal left basilar airspace disease may represent
atelectasis.  Infection is not excluded.

## 2013-02-07 ENCOUNTER — Encounter: Payer: Self-pay | Admitting: Family Medicine

## 2013-02-07 NOTE — Assessment & Plan Note (Signed)
Unknown etiology. Extensive w/u unrevealing. Fortunately these febrile episodes have nearly become quiescent.

## 2013-02-07 NOTE — Assessment & Plan Note (Signed)
Mostly tension type but a fair amount of more debilitating migraine HA's w/out aura. Continue current treatments.  She has been followod by HA clinic in [place] but it seems like her f/u with them has fallen by the wayside.

## 2013-02-07 NOTE — Assessment & Plan Note (Signed)
Likely has chronic fatigue syndrome. A long list of dx have been searched for and nothing has been turned up to explain this. Quality of sleep Questionnaire today score was 3 today, which is low risk for OSA. No sleep study will be pursued at this time.

## 2013-02-07 NOTE — Assessment & Plan Note (Signed)
Control unchanged, overall pretty poor but no med changes made. She seems to be in a holding pattern regarding management of this condition. RF'd pain meds today.

## 2013-02-07 NOTE — Assessment & Plan Note (Signed)
Not in acute flare but describes poor control. Will give prednisone 20mg  qd x 5d, then 10mg  qd x 6d.   Also, will add 10mg  singulair qhs today.

## 2013-02-13 ENCOUNTER — Encounter: Payer: Self-pay | Admitting: Family Medicine

## 2013-02-13 ENCOUNTER — Ambulatory Visit (INDEPENDENT_AMBULATORY_CARE_PROVIDER_SITE_OTHER): Payer: BC Managed Care – PPO | Admitting: Family Medicine

## 2013-02-13 VITALS — BP 149/79 | HR 100 | Temp 97.8°F | Resp 16 | Ht 64.5 in | Wt 198.0 lb

## 2013-02-13 DIAGNOSIS — M797 Fibromyalgia: Secondary | ICD-10-CM

## 2013-02-13 DIAGNOSIS — IMO0001 Reserved for inherently not codable concepts without codable children: Secondary | ICD-10-CM

## 2013-02-13 DIAGNOSIS — E042 Nontoxic multinodular goiter: Secondary | ICD-10-CM

## 2013-02-13 DIAGNOSIS — R110 Nausea: Secondary | ICD-10-CM

## 2013-02-13 DIAGNOSIS — R5382 Chronic fatigue, unspecified: Secondary | ICD-10-CM

## 2013-02-13 DIAGNOSIS — R5381 Other malaise: Secondary | ICD-10-CM

## 2013-02-13 DIAGNOSIS — M2550 Pain in unspecified joint: Secondary | ICD-10-CM

## 2013-02-13 DIAGNOSIS — R635 Abnormal weight gain: Secondary | ICD-10-CM

## 2013-02-13 DIAGNOSIS — J4540 Moderate persistent asthma, uncomplicated: Secondary | ICD-10-CM

## 2013-02-13 DIAGNOSIS — A689 Relapsing fever, unspecified: Secondary | ICD-10-CM

## 2013-02-13 DIAGNOSIS — J45909 Unspecified asthma, uncomplicated: Secondary | ICD-10-CM

## 2013-02-13 MED ORDER — PROMETHAZINE HCL 25 MG PO TABS: 25.0000 mg | ORAL_TABLET | Freq: Four times a day (QID) | ORAL | Status: DC | PRN

## 2013-02-13 MED ORDER — OXYCODONE HCL ER 60 MG PO T12A: 1.0000 | EXTENDED_RELEASE_TABLET | Freq: Two times a day (BID) | ORAL | Status: DC

## 2013-02-13 MED ORDER — OXYCODONE HCL 5 MG PO TABS: 5.0000 mg | ORAL_TABLET | ORAL | Status: DC | PRN

## 2013-02-13 NOTE — Progress Notes (Addendum)
OFFICE NOTE  02/13/2013  CC: No chief complaint on file.    HPI: Patient is a 47 y.o. Caucasian female who is here for routine 1 mo f/u for severe fibromyalgia on chronic narcotic pain mgmt, numerous other medical issues followed chronically. She is feeling overall the same--taking singulair.  Still has wheezing almost daily, seems like prednisone burst x 5d didn't help much. She is on max therapy.  Reports one "febrile episode" in the last month.  She says her pain is up a bit more lately due to stress/body tension lately. Has started a new diet: more protein, fruits/veggies.  Trying to get walking more and working in garden.   Pertinent PMH:  Past Medical History  Diagnosis Date  . DDD (degenerative disc disease), lumbar   . Atypical chest pain     Question of coronary vasospasm; cath 10/20/10 NORMAL  . Anxiety   . Fibromyalgia     Dr. Hope Pigeon [phone]  . Interstitial cystitis   . Obesity   . Insomnia   . Transaminasemia 03/2010    (ALT 49), normal on f/u testing off of daily tylenol uuse.  Marland Kitchen GERD (gastroesophageal reflux disease) 06/01/10    Dyspepsia/gastritis, EGD 05/2010: gastritis (NSAIDS)  . Rectal bleeding 06/01/10    Anorectal bleeding/BRBPR--colonoscopy: internal hemorrhoids  . Recurrent UTI     renal u/s normal 07/2007  . Weight loss     CT abd/pelf 08/22/10 remakable only for bile duct 9mm (unchanged from 03/2004 PRIOR to ERCP w/spincterotomy, bile duct balloon dredging and lap chole)  . Recurrent fever     W/u unrevealing, including ID consult.    . Asthma   . Osteopenia     bone densitometry 2006 per pt  . 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 Christella Hartigan  . Multinodular goiter 11/2010    TSH normal  . Chest pain   . Rectal prolapse 02/2011    repaired lap [id number]  . Vitamin D deficiency 08/20/2011  . History of migraine headaches     Dr. Anne Hahn at [facility] 03/2012: started nortriptyline 10mg --titrating to 3 caps qhs  .  Edema 10/31/2011  . Lobar pneumonia mid May, 2013    RML and RLL, with sepsis--admission to [facility] 12/2011--responded extremely well to rocephin + zithromax.  . IBS (irritable bowel syndrome)   . De Quervain's tenosynovitis, left 11/11/2011  . Anemia, iron deficiency 06/14/2011    Hemoccults NEG x 3 in December 2012    Past surgical, social, and family history reviewed and no changes noted since last office visit.  MEDS:  Outpatient Prescriptions Prior to Visit  Medication Sig Dispense Refill  . albuterol (PROAIR HFA) 108 (90 BASE) MCG/ACT inhaler Inhale 2 puffs into the lungs every 4 (four) hours as needed. Shortness of breath  1 Inhaler  1  . amLODipine (NORVASC) 5 MG tablet Take 1 tablet (5 mg total) by mouth daily.  90 tablet  1  . Calcium Carbonate (CALCIUM 500 PO) Take 1 tablet by mouth daily.       . carisoprodol (SOMA) 250 MG tablet TAKE 1 TABLET BY MOUTH 3 TIMES A DAY  270 tablet  0  . clonazePAM (KLONOPIN) 1 MG tablet 1 tab po bid prn  180 tablet  1  . Fish Oil OIL Take 2 capsules by mouth daily.       . hyoscyamine (LEVSIN SL) 0.125 MG SL tablet Take 1-2 tablets (0.125-0.25 mg total) by mouth every 6 (six) hours as needed  for cramping.  270 tablet  1  . MAGNESIUM PO Take 1 tablet by mouth daily. Unknown strength      . ondansetron (ZOFRAN-ODT) 8 MG disintegrating tablet Take 1 tablet (8 mg total) by mouth every 8 (eight) hours as needed. For nausea  270 tablet  1  . oxyCODONE (OXY IR/ROXICODONE) 5 MG immediate release tablet Take 1-2 tablets (5-10 mg total) by mouth every 4 (four) hours as needed.  300 tablet  0  . OxyCODONE HCl ER (OXYCONTIN) 60 MG T12A Take 1 tablet by mouth 2 (two) times daily.  60 each  0  . phenazopyridine (PYRIDIUM) 200 MG tablet Take 1 tablet (200 mg total) by mouth 3 (three) times daily as needed for pain.  90 tablet  2  . polyethylene glycol powder (GLYCOLAX/MIRALAX) powder DISSOLVE 1 CAPFUL INTO LIQUID AND TAKE BY MOUTH ONE TO THREE TIMES DAILY AS NEEDED FOR  CONSTIPATION  1581 g  3  . pramipexole (MIRAPEX) 0.125 MG tablet Take 3 tabs po qhs  270 tablet  1  . promethazine (PHENERGAN) 25 MG tablet Take 25-50 mg by mouth every 6 (six) hours as needed.       . ranitidine (ZANTAC) 150 MG tablet Take 1 tablet (150 mg total) by mouth 2 (two) times daily.  180 tablet  2  . sucralfate (CARAFATE) 1 G tablet 1 tab about 1 hour prior to each meal prn      . SUMAtriptan (IMITREX) 50 MG tablet Take 2 tablets (100 mg total) by mouth every 2 (two) hours as needed (migraine HA (max of 200mg /24h).  27 tablet  1  . zolpidem (AMBIEN) 10 MG tablet Take 1 tablet (10 mg total) by mouth at bedtime.  30 tablet  5  . Fluticasone-Salmeterol (ADVAIR) 500-50 MCG/DOSE AEPB Inhale 1 puff into the lungs 2 (two) times daily.  60 each  6  . montelukast (SINGULAIR) 10 MG tablet Take 1 tablet (10 mg total) by mouth at bedtime.  30 tablet  3  . predniSONE (DELTASONE) 20 MG tablet 1 tab po qd x 5d, then 1/2 tab po qd x 6d  8 tablet  0   No facility-administered medications prior to visit.    PE: Blood pressure 149/79, pulse 100, temperature 97.8 F (36.6 C), temperature source Oral, resp. rate 16, height 5' 4.5" (1.638 m), weight 198 lb (89.812 kg), SpO2 93.00%. Gen: Alert, well appearing.  Patient is oriented to person, place, time, and situation. AFFECT: pleasant, lucid thought and speech. CV: RRR, no m/r/g.   LUNGS: CTA bilat, nonlabored resps, good aeration in all lung fields.   IMPRESSION AND PLAN:  FIBROMYALGIA, SEVERE Not well controlled, no significant change.  Pain meds kept the same, RF'd--gave this month's supply plus a rx for the next month with approp fill on/after dating on rx.  NAUSEA Chronic, unclear etiology.  Patient is set on taking anti-emetics on chronic basis.  Asthma, moderate persistent Pretty stable.   She is never very clear on exactly how much albuterol she is using, nor is she clear on what sx's prompt use or whether or not she gets relief. I did  not change/add any therapy today.  Chronic fatigue Poor control/unchanged.  Work up has been extensive (repeatedly normal). Continue with reassurance, no change in meds. I think depression plays a role but she is not open to antidepressant trial of counseling at this time.   An After Visit Summary was printed and given to the patient.  Spent 30  min with pt today, with >50% of this time spent in counseling and care coordination regarding the above problems.  FOLLOW UP:  34mo f/u fibromyalgia, asthma, chronic pain syndrome

## 2013-03-11 ENCOUNTER — Encounter: Payer: Self-pay | Admitting: Family Medicine

## 2013-03-11 ENCOUNTER — Ambulatory Visit (INDEPENDENT_AMBULATORY_CARE_PROVIDER_SITE_OTHER): Payer: BC Managed Care – PPO | Admitting: Family Medicine

## 2013-03-11 VITALS — BP 120/70 | HR 104 | Temp 98.3°F | Ht 64.5 in | Wt 200.5 lb

## 2013-03-11 DIAGNOSIS — R110 Nausea: Secondary | ICD-10-CM

## 2013-03-11 DIAGNOSIS — R509 Fever, unspecified: Secondary | ICD-10-CM

## 2013-03-11 DIAGNOSIS — R109 Unspecified abdominal pain: Secondary | ICD-10-CM

## 2013-03-11 DIAGNOSIS — R000 Tachycardia, unspecified: Secondary | ICD-10-CM

## 2013-03-11 DIAGNOSIS — R300 Dysuria: Secondary | ICD-10-CM

## 2013-03-11 DIAGNOSIS — E042 Nontoxic multinodular goiter: Secondary | ICD-10-CM

## 2013-03-11 LAB — POCT URINALYSIS DIPSTICK
Glucose, UA: NEGATIVE
Nitrite, UA: NEGATIVE
Urobilinogen, UA: 0.2

## 2013-03-11 LAB — CBC WITH DIFFERENTIAL/PLATELET
Basophils Absolute: 0 10*3/uL (ref 0.0–0.1)
Basophils Relative: 0.2 % (ref 0.0–3.0)
Eosinophils Absolute: 0.1 10*3/uL (ref 0.0–0.7)
Lymphocytes Relative: 9.1 % — ABNORMAL LOW (ref 12.0–46.0)
MCHC: 32.9 g/dL (ref 30.0–36.0)
Monocytes Relative: 5.5 % (ref 3.0–12.0)
Neutrophils Relative %: 84.5 % — ABNORMAL HIGH (ref 43.0–77.0)
RBC: 4.39 Mil/uL (ref 3.87–5.11)

## 2013-03-11 LAB — COMPREHENSIVE METABOLIC PANEL
AST: 15 U/L (ref 0–37)
Albumin: 4.1 g/dL (ref 3.5–5.2)
Alkaline Phosphatase: 77 U/L (ref 39–117)
Glucose, Bld: 77 mg/dL (ref 70–99)
Potassium: 4.4 mEq/L (ref 3.5–5.1)
Sodium: 129 mEq/L — ABNORMAL LOW (ref 135–145)
Total Protein: 7.4 g/dL (ref 6.0–8.3)

## 2013-03-11 MED ORDER — CIPROFLOXACIN HCL 500 MG PO TABS: 500.0000 mg | ORAL_TABLET | Freq: Two times a day (BID) | ORAL | Status: DC

## 2013-03-12 LAB — URINE CULTURE: Colony Count: >=100000

## 2013-03-16 ENCOUNTER — Other Ambulatory Visit: Payer: Self-pay | Admitting: Family Medicine

## 2013-03-16 ENCOUNTER — Telehealth: Payer: Self-pay | Admitting: Family Medicine

## 2013-03-16 DIAGNOSIS — IMO0001 Reserved for inherently not codable concepts without codable children: Secondary | ICD-10-CM

## 2013-03-16 DIAGNOSIS — E042 Nontoxic multinodular goiter: Secondary | ICD-10-CM

## 2013-03-16 DIAGNOSIS — A689 Relapsing fever, unspecified: Secondary | ICD-10-CM

## 2013-03-16 DIAGNOSIS — R635 Abnormal weight gain: Secondary | ICD-10-CM

## 2013-03-16 DIAGNOSIS — M2550 Pain in unspecified joint: Secondary | ICD-10-CM

## 2013-03-16 DIAGNOSIS — R5381 Other malaise: Secondary | ICD-10-CM

## 2013-03-16 DIAGNOSIS — R5383 Other fatigue: Secondary | ICD-10-CM

## 2013-03-16 DIAGNOSIS — M797 Fibromyalgia: Secondary | ICD-10-CM

## 2013-03-16 NOTE — Telephone Encounter (Signed)
Ok to send eRx for generic diflucan 150mg , 1 tab po qd x 1 dose, #1, RF x 1. OK to do rx for Ambien 10mg , 1 tab po qhs, #90, no RF.  Print and I'll sign it.-thx

## 2013-03-16 NOTE — Telephone Encounter (Signed)
Patient requesting diflucan after recent antibiotics.  Patient also wants a 3 month supply of ambien to be sent to [facility] [place].  Please advise.

## 2013-03-17 MED ORDER — ZOLPIDEM TARTRATE 10 MG PO TABS: 10.0000 mg | ORAL_TABLET | Freq: Every day | ORAL | Status: DC

## 2013-03-17 MED ORDER — FLUCONAZOLE 150 MG PO TABS: 150.0000 mg | ORAL_TABLET | Freq: Once | ORAL | Status: DC

## 2013-03-24 ENCOUNTER — Telehealth: Payer: Self-pay | Admitting: *Deleted

## 2013-03-24 NOTE — Telephone Encounter (Signed)
Patient left message on vm stating that she is still having stomach issues and a burning sensation. Patient wanted to know if she needs another medication? Patient would like to do another UA tomorrow when she brings her daughter in for an appointment. Please advise?

## 2013-03-25 DIAGNOSIS — R300 Dysuria: Secondary | ICD-10-CM | POA: Insufficient documentation

## 2013-03-25 DIAGNOSIS — R109 Unspecified abdominal pain: Secondary | ICD-10-CM | POA: Insufficient documentation

## 2013-03-25 DIAGNOSIS — R000 Tachycardia, unspecified: Secondary | ICD-10-CM | POA: Insufficient documentation

## 2013-03-25 DIAGNOSIS — R509 Fever, unspecified: Secondary | ICD-10-CM | POA: Insufficient documentation

## 2013-03-25 NOTE — Assessment & Plan Note (Signed)
Chronic, unclear etiology.  Patient is set on taking anti-emetics on chronic basis.

## 2013-03-25 NOTE — Assessment & Plan Note (Signed)
Not well controlled, no significant change.  Pain meds kept the same, RF'd--gave this month's supply plus a rx for the next month with approp fill on/after dating on rx.

## 2013-03-25 NOTE — Assessment & Plan Note (Signed)
Poor control/unchanged.  Work up has been extensive (repeatedly normal). Continue with reassurance, no change in meds. I think depression plays a role but she is not open to antidepressant trial of counseling at this time.

## 2013-03-25 NOTE — Progress Notes (Signed)
OFFICE NOTE  03/25/2013  CC:  Chief Complaint  Patient presents with  . Nausea  . Fever     HPI: Patient is a 47 y.o. Caucasian female who is here for ill visit. Here with daughter, who had n/v illness last week.  Pt describes onset of worsening nausea off and on for the last 3 wks, without vomiting.  Then 5 d/a started having burning with urination, urinary frequency, and lower abd pains.  Then 3 d/a she got a fever to 102 x 1d, stomach ache and nausea continued and she vomited once.  No diarrhea except one loose stool yesterday.  No rash, no ST or URI sx's. +Body aches--?new/diff from her usual fibromyalgia pain?--and raspy voice/hoarse voice lately w/out cough.  No wheezing.  She has not taken AZO in the last 3 days.  Her nausea meds help her sx's minimally. Eating doesn't seem to make anything worse.   ROS: no eye sx's, no oral ulcers, no swelling or redness or warmth of any joints.   Pertinent PMH:  Past Medical History  Diagnosis Date  . DDD (degenerative disc disease), lumbar   . Atypical chest pain     Question of coronary vasospasm; cath 10/20/10 NORMAL  . Anxiety   . Fibromyalgia     Dr. Hope Pigeon [phone]  . Interstitial cystitis   . Obesity   . Insomnia   . Transaminasemia 03/2010    (ALT 49), normal on f/u testing off of daily tylenol uuse.  Marland Kitchen GERD (gastroesophageal reflux disease) 06/01/10    Dyspepsia/gastritis, EGD 05/2010: gastritis (NSAIDS)  . Rectal bleeding 06/01/10    Anorectal bleeding/BRBPR--colonoscopy: internal hemorrhoids  . Recurrent UTI     renal u/s normal 07/2007  . Weight loss     CT abd/pelf 08/22/10 remakable only for bile duct 9mm (unchanged from 03/2004 PRIOR to ERCP w/spincterotomy, bile duct balloon dredging and lap chole)  . Recurrent fever     W/u unrevealing, including ID consult.    . Asthma   . Osteopenia     bone densitometry 2006 per pt  . 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 Christella Hartigan  .  Multinodular goiter 11/2010    TSH normal  . Chest pain   . Rectal prolapse 02/2011    repaired lap [id number]  . Vitamin D deficiency 08/20/2011  . History of migraine headaches     Dr. Anne Hahn at [facility] 03/2012: started nortriptyline 10mg --titrating to 3 caps qhs  . Edema 10/31/2011  . Lobar pneumonia mid May, 2013    RML and RLL, with sepsis--admission to [facility] 12/2011--responded extremely well to rocephin + zithromax.  . IBS (irritable bowel syndrome)   . De Quervain's tenosynovitis, left 11/11/2011  . Anemia, iron deficiency 06/14/2011    Hemoccults NEG x 3 in December 2012    Past surgical, social, and family history reviewed and no changes noted since last office visit.  MEDS:  Outpatient Prescriptions Prior to Visit  Medication Sig Dispense Refill  . albuterol (PROAIR HFA) 108 (90 BASE) MCG/ACT inhaler Inhale 2 puffs into the lungs every 4 (four) hours as needed. Shortness of breath  1 Inhaler  1  . amLODipine (NORVASC) 5 MG tablet Take 1 tablet (5 mg total) by mouth daily.  90 tablet  1  . Calcium Carbonate (CALCIUM 500 PO) Take 1 tablet by mouth daily.       . carisoprodol (SOMA) 250 MG tablet TAKE 1 TABLET BY MOUTH 3 TIMES A DAY  270 tablet  0  . clonazePAM (KLONOPIN) 1 MG tablet 1 tab po bid prn  180 tablet  1  . Fish Oil OIL Take 2 capsules by mouth daily.       . Fluticasone-Salmeterol (ADVAIR) 500-50 MCG/DOSE AEPB Inhale 1 puff into the lungs 2 (two) times daily.  60 each  6  . hyoscyamine (LEVSIN SL) 0.125 MG SL tablet Take 1-2 tablets (0.125-0.25 mg total) by mouth every 6 (six) hours as needed for cramping.  270 tablet  1  . MAGNESIUM PO Take 1 tablet by mouth daily. Unknown strength      . montelukast (SINGULAIR) 10 MG tablet Take 1 tablet (10 mg total) by mouth at bedtime.  30 tablet  3  . ondansetron (ZOFRAN-ODT) 8 MG disintegrating tablet Take 1 tablet (8 mg total) by mouth every 8 (eight) hours as needed. For nausea  270 tablet  1  . oxyCODONE (OXY IR/ROXICODONE) 5 MG  immediate release tablet Take 1-2 tablets (5-10 mg total) by mouth every 4 (four) hours as needed.  300 tablet  0  . OxyCODONE HCl ER (OXYCONTIN) 60 MG T12A Take 1 tablet by mouth 2 (two) times daily.  60 each  0  . phenazopyridine (PYRIDIUM) 200 MG tablet Take 1 tablet (200 mg total) by mouth 3 (three) times daily as needed for pain.  90 tablet  2  . polyethylene glycol powder (GLYCOLAX/MIRALAX) powder DISSOLVE 1 CAPFUL INTO LIQUID AND TAKE BY MOUTH ONE TO THREE TIMES DAILY AS NEEDED FOR CONSTIPATION  1581 g  3  . pramipexole (MIRAPEX) 0.125 MG tablet Take 3 tabs po qhs  270 tablet  1  . ranitidine (ZANTAC) 150 MG tablet Take 1 tablet (150 mg total) by mouth 2 (two) times daily.  180 tablet  2  . sucralfate (CARAFATE) 1 G tablet 1 tab about 1 hour prior to each meal prn      . SUMAtriptan (IMITREX) 50 MG tablet Take 2 tablets (100 mg total) by mouth every 2 (two) hours as needed (migraine HA (max of 200mg /24h).  27 tablet  1  . promethazine (PHENERGAN) 25 MG tablet Take 1-2 tablets (25-50 mg total) by mouth every 6 (six) hours as needed.  60 tablet  1  . zolpidem (AMBIEN) 10 MG tablet Take 1 tablet (10 mg total) by mouth at bedtime.  30 tablet  5  . predniSONE (DELTASONE) 20 MG tablet 1 tab po qd x 5d, then 1/2 tab po qd x 6d  8 tablet  0   No facility-administered medications prior to visit.    PE: Blood pressure 120/70, pulse 104, temperature 98.3 F (36.8 C), temperature source Oral, height 5' 4.5" (1.638 m), weight 200 lb 8 oz (90.946 kg), SpO2 96.00%. Gen: alert, mildly ill appearing, NAD. ENT: no icterus.  Oral mucosa pink/moist. Neck: supple, no LAD. LUNGS: CTA bilat, nonlabored resps. CV: regular rhythm, slightly tachycardic, no murmur/rub/gallop ABD: soft, nondistended, diffusely TTP without guarding or rebound tenderness.  No mass, HSM, or bruit. EXT: no clubbing, cyanosis, or edema.  Skin - no sores or suspicious lesions or rashes or color changes  LAB: CC UA today showed small  LEU, trace lysed blood, otherwise normal.  IMPRESSION AND PLAN:  Febrile viral syndrome (acute gastritis) vs "flare" of her previously unexplained febrile episodes plus a UTI. I sent her urine for c/s and started her on cipro 500mg  bid x 5d.  FOLLOW UP: prn

## 2013-03-25 NOTE — Assessment & Plan Note (Signed)
Pretty stable.   She is never very clear on exactly how much albuterol she is using, nor is she clear on what sx's prompt use or whether or not she gets relief. I did not change/add any therapy today.

## 2013-03-26 NOTE — Telephone Encounter (Signed)
She can leave another urine sample to send for urinalysis and culture.  No more abx at this time-thx

## 2013-03-27 NOTE — Telephone Encounter (Signed)
Caller: Dierdra/Patient; Phone: [phone]; Reason for Call: Patient states she received a voicemail message to return the call to the office.  RN reviewed Counselling psychologist Health Record.  Documentation noted from Dr.  Milinda Cave on 03/26/13 1701: "She can leave another urine sample to send for urinalysis and culture.  No more abx at this time.  " Patient informed of above.  Patient verbalizes understanding.  Patient denies any further questions or issues at this time.

## 2013-03-27 NOTE — Telephone Encounter (Signed)
LMOVM for patient to return call.

## 2013-03-30 NOTE — Telephone Encounter (Signed)
Noted  

## 2013-03-31 ENCOUNTER — Telehealth: Payer: Self-pay | Admitting: Family Medicine

## 2013-03-31 NOTE — Telephone Encounter (Signed)
Message for Dr. Milinda Cave:  patient has severe pain in bilateral knees (6-7 on 0-10 scale resting and 8-9 when walking).  Had similar symptoms in 2011 but no fever every 7-10 days.  Requesting Rx for prednisone and/or topical ointment w/ Lidocaine.  Also asking if needs to have blood work drawn at this time (prefers to have drawn at lab in [place] or [place] if possible).  Patient is willing to reschedule next appointment and come earlier if need be per Dr. Milinda Cave.

## 2013-04-01 NOTE — Telephone Encounter (Signed)
Please advise 

## 2013-04-02 MED ORDER — ALBUTEROL SULFATE HFA 108 (90 BASE) MCG/ACT IN AERS: 2.0000 | INHALATION_SPRAY | RESPIRATORY_TRACT | Status: DC | PRN

## 2013-04-02 MED ORDER — PREDNISONE 20 MG PO TABS: ORAL_TABLET | ORAL | Status: DC

## 2013-04-02 NOTE — Telephone Encounter (Signed)
OK--trial of prednisone will be sent to pharmacy.  No topical meds. No labs.  No rescheduling of appt is needed.  Which pharmacy does she want her prednisone sent to? Please do prednisone 20mg  tabs, 2 tabs po qd x 5d, then 1 tab po qd x 5d, then stop, #15, no RF.-thx

## 2013-04-03 ENCOUNTER — Ambulatory Visit: Payer: BC Managed Care – PPO | Admitting: Family Medicine

## 2013-04-08 ENCOUNTER — Ambulatory Visit: Payer: BC Managed Care – PPO | Admitting: Family Medicine

## 2013-04-10 ENCOUNTER — Ambulatory Visit (INDEPENDENT_AMBULATORY_CARE_PROVIDER_SITE_OTHER)
Admission: RE | Admit: 2013-04-10 | Discharge: 2013-04-10 | Disposition: A | Discharge status: Home / Self Care | Payer: BC Managed Care – PPO | Source: Ambulatory Visit | Attending: Family Medicine | Admitting: Family Medicine

## 2013-04-10 ENCOUNTER — Encounter: Payer: Self-pay | Admitting: Family Medicine

## 2013-04-10 ENCOUNTER — Telehealth: Payer: Self-pay | Admitting: Family Medicine

## 2013-04-10 ENCOUNTER — Ambulatory Visit (INDEPENDENT_AMBULATORY_CARE_PROVIDER_SITE_OTHER): Payer: BC Managed Care – PPO | Admitting: Family Medicine

## 2013-04-10 VITALS — BP 122/88 | HR 103 | Temp 98.6°F | Resp 16 | Ht 64.5 in | Wt 195.0 lb

## 2013-04-10 DIAGNOSIS — M25569 Pain in unspecified knee: Secondary | ICD-10-CM

## 2013-04-10 DIAGNOSIS — N390 Urinary tract infection, site not specified: Secondary | ICD-10-CM

## 2013-04-10 DIAGNOSIS — M2550 Pain in unspecified joint: Secondary | ICD-10-CM

## 2013-04-10 DIAGNOSIS — M797 Fibromyalgia: Secondary | ICD-10-CM

## 2013-04-10 DIAGNOSIS — IMO0001 Reserved for inherently not codable concepts without codable children: Secondary | ICD-10-CM

## 2013-04-10 DIAGNOSIS — R5383 Other fatigue: Secondary | ICD-10-CM

## 2013-04-10 DIAGNOSIS — A689 Relapsing fever, unspecified: Secondary | ICD-10-CM

## 2013-04-10 DIAGNOSIS — R635 Abnormal weight gain: Secondary | ICD-10-CM

## 2013-04-10 DIAGNOSIS — M25562 Pain in left knee: Secondary | ICD-10-CM

## 2013-04-10 DIAGNOSIS — M25561 Pain in right knee: Secondary | ICD-10-CM

## 2013-04-10 DIAGNOSIS — R5381 Other malaise: Secondary | ICD-10-CM

## 2013-04-10 DIAGNOSIS — J45909 Unspecified asthma, uncomplicated: Secondary | ICD-10-CM

## 2013-04-10 DIAGNOSIS — J4540 Moderate persistent asthma, uncomplicated: Secondary | ICD-10-CM

## 2013-04-10 DIAGNOSIS — E042 Nontoxic multinodular goiter: Secondary | ICD-10-CM

## 2013-04-10 MED ORDER — CARISOPRODOL 250 MG PO TABS: ORAL_TABLET | ORAL | Status: DC

## 2013-04-10 MED ORDER — OXYCODONE HCL ER 60 MG PO T12A: 1.0000 | EXTENDED_RELEASE_TABLET | Freq: Two times a day (BID) | ORAL | Status: DC

## 2013-04-10 MED ORDER — PREDNISONE 20 MG PO TABS: ORAL_TABLET | ORAL | Status: DC

## 2013-04-10 MED ORDER — OXYCODONE HCL 5 MG PO TABS: 5.0000 mg | ORAL_TABLET | ORAL | Status: DC | PRN

## 2013-04-10 NOTE — Progress Notes (Signed)
OFFICE NOTE  04/10/2013  CC:  Chief Complaint  Patient presents with  . Knee Pain    x 3 weeks     HPI: Patient is a 47 y.o. Caucasian female who is here as a walk in (rescheduled last couple of appt times at the last minute) for f/u fibromyalgia, history of polyarthralgia, currently with bad knee pains. Both knees hurt "all the time" for the last 3 weeks, worse with bending and wt bearing.  No injury/trauma. No fever with this.  Hands--MCPs and PIPs hurting and stiff lately.  About 2 wks ago I did a short oral steroid taper for her and she said her knees improved about 40% for a few days only.  Her chronic myalgia pain has been unchanged.  Due for pain med RFs, asks for soma RF as well.  I have followed her for several years and I have never seen any of her joints with true arthritic findings.  Autoimmune and rheumatic dz has been repeatedly ruled out by blood testing.  PAIN has been the dominant factor+subjective feeling of swelling of various joints. Chart reviewed: no hx of knee x-rays being done in the past.  Pertinent PMH:  Past Medical History  Diagnosis Date  . DDD (degenerative disc disease), lumbar   . Atypical chest pain     Question of coronary vasospasm; cath 10/20/10 NORMAL  . Anxiety   . Fibromyalgia     Dr. Hope Pigeon [phone]  . Interstitial cystitis   . Obesity   . Insomnia   . Transaminasemia 03/2010    (ALT 49), normal on f/u testing off of daily tylenol uuse.  Marland Kitchen GERD (gastroesophageal reflux disease) 06/01/10    Dyspepsia/gastritis, EGD 05/2010: gastritis (NSAIDS)  . Rectal bleeding 06/01/10    Anorectal bleeding/BRBPR--colonoscopy: internal hemorrhoids  . Recurrent UTI     renal u/s normal 07/2007  . Weight loss     CT abd/pelf 08/22/10 remakable only for bile duct 9mm (unchanged from 03/2004 PRIOR to ERCP w/spincterotomy, bile duct balloon dredging and lap chole)  . Recurrent fever     W/u unrevealing, including ID consult.    . Asthma   . Osteopenia      bone densitometry 2006 per pt  . 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 Christella Hartigan  . Multinodular goiter 11/2010    TSH normal  . Chest pain   . Rectal prolapse 02/2011    repaired lap [id number]  . Vitamin D deficiency 08/20/2011  . History of migraine headaches     Dr. Anne Hahn at [facility] 03/2012: started nortriptyline 10mg --titrating to 3 caps qhs  . Edema 10/31/2011  . Lobar pneumonia mid May, 2013    RML and RLL, with sepsis--admission to [facility] 12/2011--responded extremely well to rocephin + zithromax.  . IBS (irritable bowel syndrome)   . De Quervain's tenosynovitis, left 11/11/2011  . Anemia, iron deficiency 06/14/2011    Hemoccults NEG x 3 in December 2012    Past surgical, social, and family history reviewed and no changes noted since last office visit.   MEDS:  Outpatient Prescriptions Prior to Visit  Medication Sig Dispense Refill  . albuterol (PROAIR HFA) 108 (90 BASE) MCG/ACT inhaler Inhale 2 puffs into the lungs every 4 (four) hours as needed. Shortness of breath  1 Inhaler  1  . amLODipine (NORVASC) 5 MG tablet Take 1 tablet (5 mg total) by mouth daily.  90 tablet  1  . Calcium Carbonate (CALCIUM 500 PO) Take  1 tablet by mouth daily.       . carisoprodol (SOMA) 250 MG tablet TAKE 1 TABLET BY MOUTH 3 TIMES A DAY  270 tablet  0  . clonazePAM (KLONOPIN) 1 MG tablet 1 tab po bid prn  180 tablet  1  . Fish Oil OIL Take 2 capsules by mouth daily.       . Fluticasone-Salmeterol (ADVAIR) 500-50 MCG/DOSE AEPB Inhale 1 puff into the lungs 2 (two) times daily.  60 each  6  . hyoscyamine (LEVSIN SL) 0.125 MG SL tablet Take 1-2 tablets (0.125-0.25 mg total) by mouth every 6 (six) hours as needed for cramping.  270 tablet  1  . MAGNESIUM PO Take 1 tablet by mouth daily. Unknown strength      . montelukast (SINGULAIR) 10 MG tablet Take 1 tablet (10 mg total) by mouth at bedtime.  30 tablet  3  . ondansetron (ZOFRAN-ODT) 8 MG disintegrating tablet Take 1 tablet (8  mg total) by mouth every 8 (eight) hours as needed. For nausea  270 tablet  1  . oxyCODONE (OXY IR/ROXICODONE) 5 MG immediate release tablet Take 1-2 tablets (5-10 mg total) by mouth every 4 (four) hours as needed.  300 tablet  0  . OxyCODONE HCl ER (OXYCONTIN) 60 MG T12A Take 1 tablet by mouth 2 (two) times daily.  60 each  0  . phenazopyridine (PYRIDIUM) 200 MG tablet Take 1 tablet (200 mg total) by mouth 3 (three) times daily as needed for pain.  90 tablet  2  . polyethylene glycol powder (GLYCOLAX/MIRALAX) powder DISSOLVE 1 CAPFUL INTO LIQUID AND TAKE BY MOUTH ONE TO THREE TIMES DAILY AS NEEDED FOR CONSTIPATION  1581 g  3  . pramipexole (MIRAPEX) 0.125 MG tablet Take 3 tabs po qhs  270 tablet  1  . predniSONE (DELTASONE) 20 MG tablet 1 tab po qd x 5d, then 1/2 tab po qd x 6d  8 tablet  0  . predniSONE (DELTASONE) 20 MG tablet 2 tabs po qd x 5d, then 1 tab po qd x 5d, then stop  15 tablet  0  . ranitidine (ZANTAC) 150 MG tablet Take 1 tablet (150 mg total) by mouth 2 (two) times daily.  180 tablet  2  . sucralfate (CARAFATE) 1 G tablet 1 tab about 1 hour prior to each meal prn      . SUMAtriptan (IMITREX) 50 MG tablet Take 2 tablets (100 mg total) by mouth every 2 (two) hours as needed (migraine HA (max of 200mg /24h).  27 tablet  1  . zolpidem (AMBIEN) 10 MG tablet Take 1 tablet (10 mg total) by mouth at bedtime.  90 tablet  0  . ciprofloxacin (CIPRO) 500 MG tablet Take 1 tablet (500 mg total) by mouth 2 (two) times daily.  10 tablet  0  . fluconazole (DIFLUCAN) 150 MG tablet Take 1 tablet (150 mg total) by mouth once. Repeat if needed  2 tablet  1   No facility-administered medications prior to visit.    PE: Blood pressure 122/88, pulse 103, temperature 98.6 F (37 C), temperature source Temporal, resp. rate 16, height 5' 4.5" (1.638 m), weight 195 lb (88.451 kg), SpO2 96.00%. Gen: Alert, tired appearing but NAD.  Patient is oriented to person, place, time, and situation. Knees: no erythema  or warmth.  Periarticular swelling is mild/mod bilat.  She has significant tenderness to palpation in entire anterior knee region>posterior knees>lateral knees.  +Tender in prox and dist patellar tendon regions  bilat.  All ROM of both knees is painful, but she is only limited in flexion---she can flex to 90 deg only (limited by pain).     IMPRESSION AND PLAN:  1) Bilat knee arthralgias, possible periarticular swelling. Will go ahead and do another oral steroid taper: 60mg  qd x 5d, then 40mg  qd x 5d, then 20mg  qd x 5d. This is what the patient desires and she is aware of potential side effects/risks of this med, also aware that there is no objective sign of true arthritis/intra-articular joint problem noted.  Will check bilat complete knee x-rays today.  2) Fibromyalgia: stable.  Continue all current meds. She desires trial of lyrica b/c she was told by one of her specialists that it may help with her knee problems. In the past years' treatment of her fibromyalgia I feel certain that we have tried her on this med already, so I'll research her chart and see.  Will rx trial of this med if no record of failed trial in the past.  Refilled pain pills and soma today--see meds/orders. Pt declined flu vaccine today.  An After Visit Summary was printed and given to the patient.  FOLLOW UP: 1 mo  ADDENDUM: pt returned later in the day today and dropped off a urine sample, presumably due to ongoing cystitis sx's.  Last urine clx neg.  This UA was mild abnl, so will send for clx--no abx at this time. If clx neg, will suggest to pt that she revisit her urologist for f/u of her previously diagnosed interstitial cystitis.

## 2013-04-10 NOTE — Telephone Encounter (Signed)
Pls notify pt that I have confirmed that she filled her oxycodone and oxycontin on 03/14/13.   I won't refill her pain meds early---as per our pain contract. I will get back to her about the lyrica b/c I need to research her chart to make sure we have not already tried that on her in the past.-thx

## 2013-04-10 NOTE — Telephone Encounter (Signed)
Patient states her rheumatologist mentioned Lyrcia for her knee pain. Patient would like to try medication for a month while the pain is severe. Can patient get an Rx?  Patient states today's Rx was noted to fill on 04/14/13 however patient filled the Rx 04/12/13 last month. Can something be called in to carry her over until the 9th? Patient mentioned Vicodan or anything you can recommend.

## 2013-04-10 NOTE — Telephone Encounter (Signed)
Please advise 

## 2013-04-10 NOTE — Telephone Encounter (Signed)
Patient states that she will be okay now because she got her topical cream today.

## 2013-04-12 LAB — URINE CULTURE: Colony Count: >=100000

## 2013-04-15 ENCOUNTER — Ambulatory Visit: Payer: BC Managed Care – PPO | Admitting: Family Medicine

## 2013-04-17 ENCOUNTER — Ambulatory Visit: Payer: BC Managed Care – PPO | Admitting: Family Medicine

## 2013-04-20 ENCOUNTER — Telehealth: Payer: Self-pay | Admitting: Family Medicine

## 2013-04-20 DIAGNOSIS — E042 Nontoxic multinodular goiter: Secondary | ICD-10-CM

## 2013-04-20 DIAGNOSIS — A689 Relapsing fever, unspecified: Secondary | ICD-10-CM

## 2013-04-20 DIAGNOSIS — J4540 Moderate persistent asthma, uncomplicated: Secondary | ICD-10-CM

## 2013-04-20 DIAGNOSIS — M2550 Pain in unspecified joint: Secondary | ICD-10-CM

## 2013-04-20 DIAGNOSIS — IMO0001 Reserved for inherently not codable concepts without codable children: Secondary | ICD-10-CM

## 2013-04-20 DIAGNOSIS — R5381 Other malaise: Secondary | ICD-10-CM

## 2013-04-20 DIAGNOSIS — R635 Abnormal weight gain: Secondary | ICD-10-CM

## 2013-04-20 DIAGNOSIS — M797 Fibromyalgia: Secondary | ICD-10-CM

## 2013-04-20 MED ORDER — PROMETHAZINE HCL 25 MG PO TABS: 25.0000 mg | ORAL_TABLET | Freq: Four times a day (QID) | ORAL | Status: DC | PRN

## 2013-04-20 MED ORDER — CLONAZEPAM 1 MG PO TABS: ORAL_TABLET | ORAL | Status: DC

## 2013-04-20 NOTE — Telephone Encounter (Signed)
Medication faxed.  Patient aware.

## 2013-04-20 NOTE — Telephone Encounter (Signed)
Patient requesting refill of klonopin and phenergan.  Patient last seen 04/10/13.  Klonopin last printed 09/22/12 x 1 refill.  I don't see phenergan in chart.  Please advise refills.

## 2013-04-20 NOTE — Telephone Encounter (Signed)
Rxs printed.  

## 2013-04-29 ENCOUNTER — Ambulatory Visit (INDEPENDENT_AMBULATORY_CARE_PROVIDER_SITE_OTHER): Payer: BC Managed Care – PPO | Admitting: Family Medicine

## 2013-04-29 ENCOUNTER — Telehealth: Payer: Self-pay | Admitting: Family Medicine

## 2013-04-29 ENCOUNTER — Ambulatory Visit: Payer: BC Managed Care – PPO | Admitting: Family Medicine

## 2013-04-29 ENCOUNTER — Encounter: Payer: Self-pay | Admitting: Family Medicine

## 2013-04-29 ENCOUNTER — Ambulatory Visit (INDEPENDENT_AMBULATORY_CARE_PROVIDER_SITE_OTHER)
Admission: RE | Admit: 2013-04-29 | Discharge: 2013-04-29 | Disposition: A | Discharge status: Home / Self Care | Payer: BC Managed Care – PPO | Source: Ambulatory Visit | Attending: Family Medicine | Admitting: Family Medicine

## 2013-04-29 VITALS — BP 133/89 | HR 133 | Temp 99.4°F | Resp 22 | Ht 64.5 in | Wt 208.0 lb

## 2013-04-29 DIAGNOSIS — J189 Pneumonia, unspecified organism: Secondary | ICD-10-CM

## 2013-04-29 DIAGNOSIS — M2550 Pain in unspecified joint: Secondary | ICD-10-CM

## 2013-04-29 DIAGNOSIS — J029 Acute pharyngitis, unspecified: Secondary | ICD-10-CM

## 2013-04-29 DIAGNOSIS — A689 Relapsing fever, unspecified: Secondary | ICD-10-CM

## 2013-04-29 DIAGNOSIS — R05 Cough: Secondary | ICD-10-CM

## 2013-04-29 DIAGNOSIS — R509 Fever, unspecified: Secondary | ICD-10-CM

## 2013-04-29 DIAGNOSIS — J989 Respiratory disorder, unspecified: Secondary | ICD-10-CM | POA: Insufficient documentation

## 2013-04-29 HISTORY — DX: Pneumonia, unspecified organism: J18.9

## 2013-04-29 LAB — COMPREHENSIVE METABOLIC PANEL
Albumin: 4 g/dL (ref 3.5–5.2)
BUN: 5 mg/dL — ABNORMAL LOW (ref 6–23)
CO2: 26 mEq/L (ref 19–32)
Calcium: 9.4 mg/dL (ref 8.4–10.5)
Chloride: 94 mEq/L — ABNORMAL LOW (ref 96–112)
Glucose, Bld: 103 mg/dL — ABNORMAL HIGH (ref 70–99)
Potassium: 4.4 mEq/L (ref 3.5–5.3)
Sodium: 130 mEq/L — ABNORMAL LOW (ref 135–145)
Total Protein: 7.4 g/dL (ref 6.0–8.3)

## 2013-04-29 LAB — CBC WITH DIFFERENTIAL/PLATELET
HCT: 34.1 % — ABNORMAL LOW (ref 36.0–46.0)
Hemoglobin: 11.6 g/dL — ABNORMAL LOW (ref 12.0–15.0)
Lymphocytes Relative: 7 % — ABNORMAL LOW (ref 12–46)
Lymphs Abs: 1 10*3/uL (ref 0.7–4.0)
MCHC: 34 g/dL (ref 30.0–36.0)
Monocytes Absolute: 0.5 10*3/uL (ref 0.1–1.0)
Monocytes Relative: 3 % (ref 3–12)
Neutro Abs: 13.5 10*3/uL — ABNORMAL HIGH (ref 1.7–7.7)
RBC: 4.14 MIL/uL (ref 3.87–5.11)
WBC: 15.1 10*3/uL — ABNORMAL HIGH (ref 4.0–10.5)

## 2013-04-29 LAB — LIPASE: Lipase: <10 U/L (ref 0–75)

## 2013-04-29 MED ORDER — LEVOFLOXACIN 750 MG PO TABS: 750.0000 mg | ORAL_TABLET | Freq: Every day | ORAL | Status: DC

## 2013-04-29 NOTE — Telephone Encounter (Signed)
Patient Information:  Caller Name: Douglas  Phone: [phone]  Patient: Alexandra Reid, Alexandra Reid  Gender: Female  DOB: 03-21-1966  Age: 46 Years  PCP: [facility])  Pregnant: No  Office Follow Up:  Does the office need to follow up with this patient?: No  Instructions For The Office: N/A  RN Note:  Hx: Hysterectomy. Patient states she has intermittent fever, onset 04/19/14. States she has had intermittent fevers in the past. Patient states she has intermittent nausea. No vomiting 04/29/13. No diarrhea. Patient complains of generalized joint aching. Patient complains of chills. Patient states she also has bumps on the roof of her mouth and on her gums. Patient has history of asthma. States she developed wheezing, onset 04/28/13. States wheezing is constant. Patient is able to speak in full sentences. Care advice given per guidelines. Patient advised increased fluids, Tylenol 2 tablets for fever. Patient advised to use Albuterol nebulizer as directed. Call back parameters reviewed. Patient verbalizes understanding. Patient states she is one hour away from office. Patient declines to be seen in the ED.  Appt. scheduled for 04/29/13 1315 with Dr. Milinda Cave.  Symptoms  Reason For Call & Symptoms: Fever  Reviewed Health History In EMR: Yes  Reviewed Medications In EMR: Yes  Reviewed Allergies In EMR: Yes  Reviewed Surgeries / Procedures: Yes  Date of Onset of Symptoms: 04/19/2013  Any Fever: Yes  Fever Taken: Oral  Fever Time Of Reading: 10:15:00  Fever Last Reading: 103.6 OB / GYN:  LMP: Unknown  Guideline(s) Used:  Fever  Asthma Attack  Disposition Per Guideline:   See Today in Office  Reason For Disposition Reached:   Fever > 103 F (39.4 C)  Advice Given:  For All Fevers:  Drink cold fluids orally to prevent dehydration (Reason: good hydration replaces sweat and improves heat loss via skin). Adults should drink 6-8 glasses of water daily.  Dress in one layer of  lightweight clothing and sleep with one light blanket.  Call Back If:  You become worse  Quick-Relief Asthma Medicine:   Start your quick-relief medicine (e.g., albuterol, salbutamol) at the first sign of any coughing or shortness of breath (don't wait for wheezing). Use your inhaler (2 puffs each time) or nebulizer every 4 hours. Continue the quick-relief medicine until you have not wheezed or coughed for 48 hours.  Drinking Liquids:  Try to drink normal amount of liquids (e.g., water). Being adequately hydrated makes it easier to cough up the sticky lung mucus.  Humidifier:   If the air is dry, use a cool mist humidifier to prevent drying of the upper airway.  Patient Will Follow Care Advice:  YES  Appointment Scheduled:  04/29/2013 13:15:00 Appointment Scheduled Provider:  [facility])

## 2013-04-29 NOTE — Progress Notes (Addendum)
OFFICE NOTE  04/29/2013  CC:  Chief Complaint  Patient presents with  . Sore Throat  . Fever     HPI: Patient is a 47 y.o. Caucasian female who is here for fever and ST but also a multitude of other symptoms: Says this is her 3rd febrile "episode" in the last 10d; 10d ago: fever to 102.6, +n/v and legs aching.  Lasted 1-2d. 6d ago: fever to 102-103 nausea, HA, legs and knees.  Lasted 1-2d. Today, onset of fever this morning, legs aching, HA, nausea, ST, cough/wheezing onset last night.  Mouth for the last 1-2 wks has had a lot of sores: tongue easily bleeds.  Some midepigastric region pain, no vomiting.  No diarrhea. She finished a 15 day steroid taper on 04/2013 and she says knees were feeling "so much better".  Pertinent PMH:  Past Medical History  Diagnosis Date  . DDD (degenerative disc disease), lumbar   . Atypical chest pain     Question of coronary vasospasm; cath 10/20/10 NORMAL  . Anxiety   . Fibromyalgia     Dr. Hope Pigeon [phone]  . Interstitial cystitis   . Obesity   . Insomnia   . Transaminasemia 03/2010    (ALT 49), normal on f/u testing off of daily tylenol uuse.  Marland Kitchen GERD (gastroesophageal reflux disease) 06/01/10    Dyspepsia/gastritis, EGD 05/2010: gastritis (NSAIDS)  . Rectal bleeding 06/01/10    Anorectal bleeding/BRBPR--colonoscopy: internal hemorrhoids  . Recurrent UTI     renal u/s normal 07/2007  . Weight loss     CT abd/pelf 08/22/10 remakable only for bile duct 9mm (unchanged from 03/2004 PRIOR to ERCP w/spincterotomy, bile duct balloon dredging and lap chole)  . Recurrent fever     W/u unrevealing, including ID consult.    . Asthma   . Osteopenia     bone densitometry 2006 per pt  . 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 Christella Hartigan  . Multinodular goiter 11/2010    TSH normal  . Chest pain   . Rectal prolapse 02/2011    repaired lap [id number]  . Vitamin D deficiency 08/20/2011  . History of migraine headaches     Dr.  Anne Hahn at [facility] 03/2012: started nortriptyline 10mg --titrating to 3 caps qhs  . Edema 10/31/2011  . Lobar pneumonia mid May, 2013    RML and RLL, with sepsis--admission to [facility] 12/2011--responded extremely well to rocephin + zithromax.  . IBS (irritable bowel syndrome)   . De Quervain's tenosynovitis, left 11/11/2011  . Anemia, iron deficiency 06/14/2011    Hemoccults NEG x 3 in December 2012     MEDS:  Outpatient Prescriptions Prior to Visit  Medication Sig Dispense Refill  . albuterol (PROAIR HFA) 108 (90 BASE) MCG/ACT inhaler Inhale 2 puffs into the lungs every 4 (four) hours as needed. Shortness of breath  1 Inhaler  1  . amLODipine (NORVASC) 5 MG tablet Take 1 tablet (5 mg total) by mouth daily.  90 tablet  1  . Calcium Carbonate (CALCIUM 500 PO) Take 1 tablet by mouth daily.       . carisoprodol (SOMA) 250 MG tablet TAKE 1 TABLET BY MOUTH 3 TIMES A DAY  270 tablet  0  . clonazePAM (KLONOPIN) 1 MG tablet 1 tab po bid prn  180 tablet  1  . Fish Oil OIL Take 2 capsules by mouth daily.       . Fluticasone-Salmeterol (ADVAIR) 500-50 MCG/DOSE AEPB Inhale 1 puff into the  lungs 2 (two) times daily.  60 each  6  . hyoscyamine (LEVSIN SL) 0.125 MG SL tablet Take 1-2 tablets (0.125-0.25 mg total) by mouth every 6 (six) hours as needed for cramping.  270 tablet  1  . MAGNESIUM PO Take 1 tablet by mouth daily. Unknown strength      . montelukast (SINGULAIR) 10 MG tablet Take 1 tablet (10 mg total) by mouth at bedtime.  30 tablet  3  . ondansetron (ZOFRAN-ODT) 8 MG disintegrating tablet Take 1 tablet (8 mg total) by mouth every 8 (eight) hours as needed. For nausea  270 tablet  1  . oxyCODONE (OXY IR/ROXICODONE) 5 MG immediate release tablet Take 1-2 tablets (5-10 mg total) by mouth every 4 (four) hours as needed.  300 tablet  0  . OxyCODONE HCl ER (OXYCONTIN) 60 MG T12A Take 1 tablet by mouth 2 (two) times daily.  60 each  0  . phenazopyridine (PYRIDIUM) 200 MG tablet Take 1 tablet (200 mg total)  by mouth 3 (three) times daily as needed for pain.  90 tablet  2  . polyethylene glycol powder (GLYCOLAX/MIRALAX) powder DISSOLVE 1 CAPFUL INTO LIQUID AND TAKE BY MOUTH ONE TO THREE TIMES DAILY AS NEEDED FOR CONSTIPATION  1581 g  3  . pramipexole (MIRAPEX) 0.125 MG tablet Take 3 tabs po qhs  270 tablet  1  . predniSONE (DELTASONE) 20 MG tablet 3 tabs po qd x 5d then 2 tabs po qd x 5d, then 1 tab po qd x 5d, then 1/2 tab po qd x 4d, then stop.  32 tablet  0  . promethazine (PHENERGAN) 25 MG tablet Take 1 tablet (25 mg total) by mouth every 6 (six) hours as needed for nausea.  60 tablet  1  . ranitidine (ZANTAC) 150 MG tablet Take 1 tablet (150 mg total) by mouth 2 (two) times daily.  180 tablet  2  . sucralfate (CARAFATE) 1 G tablet 1 tab about 1 hour prior to each meal prn      . SUMAtriptan (IMITREX) 50 MG tablet Take 2 tablets (100 mg total) by mouth every 2 (two) hours as needed (migraine HA (max of 200mg /24h).  27 tablet  1  . zolpidem (AMBIEN) 10 MG tablet Take 1 tablet (10 mg total) by mouth at bedtime.  90 tablet  0   No facility-administered medications prior to visit.    PE: Blood pressure 133/89, pulse 133, temperature 99.4 F (37.4 C), temperature source Temporal, resp. rate 22, height 5' 4.5" (1.638 m), weight 208 lb (94.348 kg), SpO2 93.00%. Gen: Alert, ill/tired- appearing but in NAD, lying on exam table when I walk in.  Patient is oriented to person, place, time, and situation. VS: noted--normal. HEENT: eyes without injection, drainage, or swelling.  Ears: EACs clear, TMs with normal light reflex and landmarks.  Nose: Clear rhinorrhea, with some dried, crusty exudate adherent to mildly injected mucosa.  No purulent d/c.  No paranasal sinus TTP.  No facial swelling.  Throat and mouth without focal lesion.  No pharyngial swelling, erythema, or exudate.   Neck: supple, no LAD.   LUNGS: CTA bilat, nonlabored resps.  She does not take deep inhalation/exhalation well upon command. CV:  Regular rhythm, tachycardic, no m/r/g. ABD: soft, mild diffuse tenderness to shallow palpation, without guarding or rebound.  Most of her tenderness is in upper 1/2 of her abdomen.  No HSM or mass or bruit.  BS normal.  No distention. EXT: no c/c/e SKIN: no  facial or other rash Musc: Lateral hips TTP and ER causes a bit of hip discomfort.  Hands mildly TTP but no swelling, warmth, or erythema of any finger joints.  Wrists a bit painful for her to flex/extend but no warmth or swelling or tenderness.  Elbows and shoulders and ankles ROM intact and without signs of inflammation.  Both knees are diffusely tender, with a question of mild warmth in left anterior knee but I cannot discern any effusion--admittedly she has diffusely "puffy" soft tissues around knees at all times (past x-rays during times in which her knees felt swollen to her ended up being normal).  She has normal neck ROM but is tender to palpation in cervical spine musculature and in soft tissues of upper, mid, and low back diffusely.   Neuro: CN 2-12 intact bilaterally, strength 5/5 in proximal and distal upper extremities and lower extremities bilaterally. No tremor.    LAB:  CXR PA/LAT in office today showed questionable left mid/lower lung patchy infiltrate.  Question of diffuse pulm vasc congestion/interstitial edema but film appears to be poor inspiration as well.  IMPRESSION AND PLAN:  Febrile respiratory illness Check CXR to further eval for infiltrate. She has no lung sounds to indicate obstruction at this time, so no steroids are indicated. Continue albuterol ONLY if wheezing/SOB. Check CBC w/diff. Influenza is doubtful given pt's overall history of similar febrile episodes (but not usually with the resp sx's).    FEVER, RECURRENT This illness matches her past descriptions of her "typical" febrile episode except this time she also has respiratory symptoms. Multiple lengthy workups in the past (immuno, ID, rheum) have been  unfruitful but we have rarely done a blood w/u when she has been in the midst of a febrile period. Therefore, will go ahead and repeat some of her labs to see if anything comes back remarkably different: CBC, CMET, lipase, ESR, CRP, Rh factor, ANA, CCP IgA.  Awaiting radiologist's CXR over-read.  An After Visit Summary was printed and given to the patient.  FOLLOW UP: 5-6 d   Addendum 04/29/13: Radiologist's reading of the CXR today confirmed left basilar pneumonia, mild bronchitic changes, otherwise wnl. Will contact pt and get her started on oral abx.--PM

## 2013-04-29 NOTE — Telephone Encounter (Signed)
Patient showed up at office and made an appointment instead.

## 2013-04-29 NOTE — Assessment & Plan Note (Signed)
This illness matches her past descriptions of her "typical" febrile episode except this time she also has respiratory symptoms. Multiple lengthy workups in the past (immuno, ID, rheum) have been unfruitful but we have rarely done a blood w/u when she has been in the midst of a febrile period. Therefore, will go ahead and repeat some of her labs to see if anything comes back remarkably different: CBC, CMET, lipase, ESR, CRP, Rh factor, ANA, CCP IgA.

## 2013-04-29 NOTE — Addendum Note (Signed)
Addended by: Jeoffrey Massed on: 04/29/2013 05:29 PM   Modules accepted: Orders

## 2013-04-29 NOTE — Telephone Encounter (Signed)
Patient called requesting magic mouth wash for the thrush in her mouth.  Per Dr. Milinda Cave, patient can have it but he wants to make sure she knows that won't treat thrush.  It will only help symptoms.   I called to tell patient and got a VM. LMOM for patient to return call.

## 2013-04-29 NOTE — Assessment & Plan Note (Signed)
Check CXR to further eval for infiltrate. She has no lung sounds to indicate obstruction at this time, so no steroids are indicated. Continue albuterol ONLY if wheezing/SOB. Check CBC w/diff. Influenza is doubtful given pt's overall history of similar febrile episodes (but not usually with the resp sx's).

## 2013-04-30 LAB — TSH: TSH: 0.578 u[IU]/mL (ref 0.350–4.500)

## 2013-04-30 LAB — HIGH SENSITIVITY CRP: CRP, High Sensitivity: 30.7 mg/L — ABNORMAL HIGH

## 2013-05-01 LAB — CULTURE, GROUP A STREP: Organism ID, Bacteria: NORMAL

## 2013-05-06 ENCOUNTER — Other Ambulatory Visit: Payer: Self-pay | Admitting: Family Medicine

## 2013-05-06 MED ORDER — MONTELUKAST SODIUM 10 MG PO TABS: 10.0000 mg | ORAL_TABLET | Freq: Every day | ORAL | Status: DC

## 2013-05-08 ENCOUNTER — Ambulatory Visit: Payer: BC Managed Care – PPO | Admitting: Family Medicine

## 2013-05-11 ENCOUNTER — Ambulatory Visit: Payer: BC Managed Care – PPO | Admitting: Family Medicine

## 2013-05-13 ENCOUNTER — Ambulatory Visit: Payer: BC Managed Care – PPO | Admitting: Family Medicine

## 2013-05-13 ENCOUNTER — Ambulatory Visit (INDEPENDENT_AMBULATORY_CARE_PROVIDER_SITE_OTHER): Payer: BC Managed Care – PPO | Admitting: Family Medicine

## 2013-05-13 ENCOUNTER — Encounter: Payer: Self-pay | Admitting: Family Medicine

## 2013-05-13 VITALS — BP 152/87 | HR 115 | Temp 99.0°F | Resp 18 | Ht 64.5 in | Wt 211.0 lb

## 2013-05-13 DIAGNOSIS — M25569 Pain in unspecified knee: Secondary | ICD-10-CM

## 2013-05-13 DIAGNOSIS — M797 Fibromyalgia: Secondary | ICD-10-CM

## 2013-05-13 DIAGNOSIS — G8929 Other chronic pain: Secondary | ICD-10-CM | POA: Insufficient documentation

## 2013-05-13 DIAGNOSIS — J4540 Moderate persistent asthma, uncomplicated: Secondary | ICD-10-CM

## 2013-05-13 DIAGNOSIS — E042 Nontoxic multinodular goiter: Secondary | ICD-10-CM

## 2013-05-13 DIAGNOSIS — J45909 Unspecified asthma, uncomplicated: Secondary | ICD-10-CM

## 2013-05-13 DIAGNOSIS — M2550 Pain in unspecified joint: Secondary | ICD-10-CM

## 2013-05-13 DIAGNOSIS — IMO0001 Reserved for inherently not codable concepts without codable children: Secondary | ICD-10-CM

## 2013-05-13 DIAGNOSIS — D72829 Elevated white blood cell count, unspecified: Secondary | ICD-10-CM

## 2013-05-13 DIAGNOSIS — R5381 Other malaise: Secondary | ICD-10-CM

## 2013-05-13 DIAGNOSIS — M130 Polyarthritis, unspecified: Secondary | ICD-10-CM

## 2013-05-13 DIAGNOSIS — A689 Relapsing fever, unspecified: Secondary | ICD-10-CM

## 2013-05-13 DIAGNOSIS — J189 Pneumonia, unspecified organism: Secondary | ICD-10-CM

## 2013-05-13 DIAGNOSIS — J989 Respiratory disorder, unspecified: Secondary | ICD-10-CM

## 2013-05-13 LAB — CBC WITH DIFFERENTIAL/PLATELET
Basophils Absolute: 0 10*3/uL (ref 0.0–0.1)
Eosinophils Relative: 2.5 % (ref 0.0–5.0)
HCT: 33.3 % — ABNORMAL LOW (ref 36.0–46.0)
Hemoglobin: 11.2 g/dL — ABNORMAL LOW (ref 12.0–15.0)
Lymphs Abs: 1.1 10*3/uL (ref 0.7–4.0)
MCV: 84.5 fl (ref 78.0–100.0)
Monocytes Absolute: 0.6 10*3/uL (ref 0.1–1.0)
Monocytes Relative: 7.5 % (ref 3.0–12.0)
Neutro Abs: 5.5 10*3/uL (ref 1.4–7.7)
Platelets: 343 10*3/uL (ref 150.0–400.0)
RDW: 15.1 % — ABNORMAL HIGH (ref 11.5–14.6)

## 2013-05-13 MED ORDER — OXYCODONE HCL ER 60 MG PO T12A: 1.0000 | EXTENDED_RELEASE_TABLET | Freq: Two times a day (BID) | ORAL | Status: DC

## 2013-05-13 MED ORDER — OXYCODONE HCL 5 MG PO TABS: 5.0000 mg | ORAL_TABLET | ORAL | Status: DC | PRN

## 2013-05-13 NOTE — Assessment & Plan Note (Signed)
Poor control/stable--as usual.  RF'd pain meds today.

## 2013-05-13 NOTE — Assessment & Plan Note (Signed)
Bronchopneumonia (LLL/lingula). Improved clinically but not 100%.  Still with mildly diminished BS left base. Will recheck CBC today and if WBC still elevated will extend antibiotics.

## 2013-05-13 NOTE — Progress Notes (Signed)
OFFICE NOTE  05/13/2013  CC:  Chief Complaint  Patient presents with  . Follow-up     HPI: Patient is a 47 y.o. Caucasian female who is here for 2 week f/u pneumonia. Cough has improved.  Tm 100.6, usually 99.6.  Her asthma usually flares at night--wheezing--but this is gradually improving, too.  Energy is up, appetite better, malaise is improved.  Pain areas lately: general tenderness all over as per her usual, but the knees are so bad that it is taking over everything else.  She sees swelling but no redness of knees.  Ice packs, BenGay, arthritis cream, epsom salt baths, + oral pain meds all being used---pain level 7-8/10 at rest, then close to 10/10 with getting up and moving around. Last pain pill taken was this morning about 8 am--oxycontin.  Last took oxycodone last night.  Ambien and clonazepam both taken last night.   Pertinent PMH:  Past Medical History  Diagnosis Date  . DDD (degenerative disc disease), lumbar   . Atypical chest pain     Question of coronary vasospasm; cath 10/20/10 NORMAL  . Anxiety   . Fibromyalgia     Dr. Hope Pigeon [phone]  . Interstitial cystitis   . Obesity   . Insomnia   . Transaminasemia 03/2010    (ALT 49), normal on f/u testing off of daily tylenol uuse.  Marland Kitchen GERD (gastroesophageal reflux disease) 06/01/10    Dyspepsia/gastritis, EGD 05/2010: gastritis (NSAIDS)  . Rectal bleeding 06/01/10    Anorectal bleeding/BRBPR--colonoscopy: internal hemorrhoids  . Recurrent UTI     renal u/s normal 07/2007  . Weight loss     CT abd/pelf 08/22/10 remakable only for bile duct 9mm (unchanged from 03/2004 PRIOR to ERCP w/spincterotomy, bile duct balloon dredging and lap chole)  . Recurrent fever     W/u unrevealing, including ID consult.    . Asthma   . Osteopenia     bone densitometry 2006 per pt  . 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 Christella Hartigan  . Multinodular goiter 11/2010    TSH normal  . Chest pain   . Rectal  prolapse 02/2011    repaired lap [id number]  . Vitamin D deficiency 08/20/2011  . History of migraine headaches     Dr. Anne Hahn at [facility] 03/2012: started nortriptyline 10mg --titrating to 3 caps qhs  . Edema 10/31/2011  . Lobar pneumonia mid May, 2013    RML and RLL, with sepsis--admission to [facility] 12/2011--responded extremely well to rocephin + zithromax.  . IBS (irritable bowel syndrome)   . De Quervain's tenosynovitis, left 11/11/2011  . Anemia, iron deficiency 06/14/2011    Hemoccults NEG x 3 in December 2012     MEDS:  Outpatient Prescriptions Prior to Visit  Medication Sig Dispense Refill  . albuterol (PROAIR HFA) 108 (90 BASE) MCG/ACT inhaler Inhale 2 puffs into the lungs every 4 (four) hours as needed. Shortness of breath  1 Inhaler  1  . amLODipine (NORVASC) 5 MG tablet Take 1 tablet (5 mg total) by mouth daily.  90 tablet  1  . Calcium Carbonate (CALCIUM 500 PO) Take 1 tablet by mouth daily.       . carisoprodol (SOMA) 250 MG tablet TAKE 1 TABLET BY MOUTH 3 TIMES A DAY  270 tablet  0  . clonazePAM (KLONOPIN) 1 MG tablet 1 tab po bid prn  180 tablet  1  . Fish Oil OIL Take 2 capsules by mouth daily.       Marland Kitchen  Fluticasone-Salmeterol (ADVAIR) 500-50 MCG/DOSE AEPB Inhale 1 puff into the lungs 2 (two) times daily.  60 each  6  . hyoscyamine (LEVSIN SL) 0.125 MG SL tablet Take 1-2 tablets (0.125-0.25 mg total) by mouth every 6 (six) hours as needed for cramping.  270 tablet  1  . MAGNESIUM PO Take 1 tablet by mouth daily. Unknown strength      . montelukast (SINGULAIR) 10 MG tablet Take 1 tablet (10 mg total) by mouth at bedtime.  30 tablet  6  . ondansetron (ZOFRAN-ODT) 8 MG disintegrating tablet Take 1 tablet (8 mg total) by mouth every 8 (eight) hours as needed. For nausea  270 tablet  1  . oxyCODONE (OXY IR/ROXICODONE) 5 MG immediate release tablet Take 1-2 tablets (5-10 mg total) by mouth every 4 (four) hours as needed.  300 tablet  0  . OxyCODONE HCl ER (OXYCONTIN) 60 MG T12A Take 1  tablet by mouth 2 (two) times daily.  60 each  0  . phenazopyridine (PYRIDIUM) 200 MG tablet Take 1 tablet (200 mg total) by mouth 3 (three) times daily as needed for pain.  90 tablet  2  . polyethylene glycol powder (GLYCOLAX/MIRALAX) powder DISSOLVE 1 CAPFUL INTO LIQUID AND TAKE BY MOUTH ONE TO THREE TIMES DAILY AS NEEDED FOR CONSTIPATION  1581 g  3  . pramipexole (MIRAPEX) 0.125 MG tablet Take 3 tabs po qhs  270 tablet  1  . promethazine (PHENERGAN) 25 MG tablet Take 1 tablet (25 mg total) by mouth every 6 (six) hours as needed for nausea.  60 tablet  1  . ranitidine (ZANTAC) 150 MG tablet Take 1 tablet (150 mg total) by mouth 2 (two) times daily.  180 tablet  2  . sucralfate (CARAFATE) 1 G tablet 1 tab about 1 hour prior to each meal prn      . SUMAtriptan (IMITREX) 50 MG tablet Take 2 tablets (100 mg total) by mouth every 2 (two) hours as needed (migraine HA (max of 200mg /24h).  27 tablet  1  . zolpidem (AMBIEN) 10 MG tablet Take 1 tablet (10 mg total) by mouth at bedtime.  90 tablet  0  . levofloxacin (LEVAQUIN) 750 MG tablet Take 1 tablet (750 mg total) by mouth daily.  10 tablet  0  . predniSONE (DELTASONE) 20 MG tablet 3 tabs po qd x 5d then 2 tabs po qd x 5d, then 1 tab po qd x 5d, then 1/2 tab po qd x 4d, then stop.  32 tablet  0   No facility-administered medications prior to visit.    PE: Blood pressure 152/87, pulse 115, temperature 99 F (37.2 C), temperature source Temporal, resp. rate 18, height 5' 4.5" (1.638 m), weight 211 lb (95.709 kg), SpO2 95.00%. Gen: Alert, well appearing.  Patient is oriented to person, place, time, and situation. No icterus. No facial rash.  Oral cavity: no lesions.  Pharynx normal. Neck - No masses or thyromegaly or limitation in range of motion CV: Reg, tachy to 115, no murmur/rub/gallop LUNGS: still with mild left basilar diminished breath sounds but no wheezing or labored breathing.  No crackles.   Knees: periarticular soft tissue fullness as  per her usual.  She can passively and actively flex knees to 90 deg only, extension is full. Painful to touch anywhere on body, esp knees --joint lines tender, esp with mcmurray's. Mild anterior knee warmth bilat.  All other joints tender but no warmth or erythema.  Wrists: extension intact but flexion limited.  IMPRESSION AND PLAN:  FIBROMYALGIA, SEVERE Poor control/stable--as usual.  RF'd pain meds today.  Febrile respiratory illness Bronchopneumonia (LLL/lingula). Improved clinically but not 100%.  Still with mildly diminished BS left base. Will recheck CBC today and if WBC still elevated will extend antibiotics.     Polyarthritis She has never had firm signs of joint inflammation.  I think her knee pain is peri-articular in origin but I decided to aspirate her left knee today to send fluid for cell count/diff and crystal analysis. Also, will check uric acid level today b/c it looks like I've never checked this. All recent autoimmune/rheum labs were negative.  After consent was obtained, using sterile technique the left knee was prepped and draped.  The knee joint was entered via medial approach and 9 ml's of straw colored fluid was withdrawn and sent for cell count and diff+ crystal analysis.  No medication was injected into knee .  The procedure was well tolerated.  The patient is asked to continue to rest the knee for a few more days before resuming regular activities. Watch for fever, or increased swelling or persistent pain in knee. Call or return to clinic prn if such symptoms occur or the knee fails to improve as anticipated.    An After Visit Summary was printed and given to the patient.  FOLLOW UP: 1 mo

## 2013-05-13 NOTE — Assessment & Plan Note (Addendum)
She has never had firm signs of joint inflammation.  I think her knee pain is peri-articular in origin but I decided to aspirate her left knee today to send fluid for cell count/diff and crystal analysis. Also, will check uric acid level today b/c it looks like I've never checked this. All recent autoimmune/rheum labs were negative.  After consent was obtained, using sterile technique the left knee was prepped and draped.  The knee joint was entered via medial approach and 9 ml's of straw colored fluid was withdrawn and sent for cell count and diff+ crystal analysis.  No medication was injected into knee .  The procedure was well tolerated.  The patient is asked to continue to rest the knee for a few more days before resuming regular activities. Watch for fever, or increased swelling or persistent pain in knee. Call or return to clinic prn if such symptoms occur or the knee fails to improve as anticipated.

## 2013-05-14 ENCOUNTER — Other Ambulatory Visit: Payer: Self-pay | Admitting: Family Medicine

## 2013-05-14 DIAGNOSIS — G8929 Other chronic pain: Secondary | ICD-10-CM

## 2013-05-14 LAB — SYNOVIAL CELL COUNT + DIFF, W/ CRYSTALS
Crystals, Fluid: NONE SEEN
Eosinophils-Synovial: 0 % (ref 0–1)
Lymphocytes-Synovial Fld: 1 % (ref 0–20)
Neutrophil, Synovial: 37 % — ABNORMAL HIGH (ref 0–25)

## 2013-06-05 ENCOUNTER — Other Ambulatory Visit: Payer: Self-pay | Admitting: Family Medicine

## 2013-06-05 ENCOUNTER — Telehealth: Payer: Self-pay | Admitting: Family Medicine

## 2013-06-05 MED ORDER — PROMETHAZINE HCL 25 MG PO TABS: 25.0000 mg | ORAL_TABLET | Freq: Four times a day (QID) | ORAL | Status: DC | PRN

## 2013-06-05 NOTE — Telephone Encounter (Signed)
Please advise refills 

## 2013-06-05 NOTE — Telephone Encounter (Signed)
Patient Information:  Caller Name: Kyleigha  Phone: [phone]  Patient: Alexandra Reid, Alexandra Reid  Gender: Female  DOB: 03-07-1966  Age: 47 Years  PCP: Earley Favor [facility])  Pregnant: No  Office Follow Up:  Does the office need to follow up with this patient?: Yes  Instructions For The Office: Office please review and call pt regarding Rx refills; pt requesting be done today due to insurance issues.  RN Note:  Rx refills handled by the office staff.  Office please review and call pt regarding refill requests.  Pt instructed that she will receive a call back later today from office staff that handles refills; voices understanding  Symptoms  Reason For Call & Symptoms: Pt is calling and states that she is getting ready to change insurance plans and she is going from [facility] to [facility]; with supplemental plan starting  November 1st she will not be able to get certain Rx; she will not be able to get Phenergan and Soma anymore; pt is requesting a 3 month supply of Phenergan and Flexeril before tomorrow November 1st; pt states that she is not on Flexeril but states that Tresa Garter will not be available until mid Meta; denies any sx just wanting refills due to insurance  Reviewed Health History In EMR: Yes  Reviewed Medications In EMR: Yes  Reviewed Allergies In EMR: Yes  Reviewed Surgeries / Procedures: Yes  Date of Onset of Symptoms: Unknown OB / GYN:  LMP: Unknown  Guideline(s) Used:  No Protocol Available - Information Only  Disposition Per Guideline:   Discuss with PCP and Callback by Nurse Today  Reason For Disposition Reached:   Nursing judgment  Advice Given:  N/A  Patient Will Follow Care Advice:  YES

## 2013-06-05 NOTE — Telephone Encounter (Signed)
Will RF phenergan but I will only be able to fill soma OR flexeril on or after 07/13/13, since her last soma rx was to be filled 04/14/13 and it was a 90 day supply.  Pls ask pt what pharmacy she wants the phenergan sent to.-thx

## 2013-06-08 NOTE — Telephone Encounter (Signed)
Called Patient and LMOM about refill.   Told her since she didn't pick up we would send refill of phenergan to [facility].

## 2013-06-11 ENCOUNTER — Encounter: Payer: Self-pay | Admitting: Family Medicine

## 2013-06-11 ENCOUNTER — Ambulatory Visit (INDEPENDENT_AMBULATORY_CARE_PROVIDER_SITE_OTHER): Payer: BC Managed Care – PPO | Admitting: Family Medicine

## 2013-06-11 VITALS — BP 131/99 | HR 120 | Temp 98.2°F | Resp 20 | Ht 64.5 in | Wt 207.0 lb

## 2013-06-11 DIAGNOSIS — A689 Relapsing fever, unspecified: Secondary | ICD-10-CM

## 2013-06-11 DIAGNOSIS — IMO0001 Reserved for inherently not codable concepts without codable children: Secondary | ICD-10-CM

## 2013-06-11 DIAGNOSIS — M2550 Pain in unspecified joint: Secondary | ICD-10-CM

## 2013-06-11 DIAGNOSIS — E042 Nontoxic multinodular goiter: Secondary | ICD-10-CM

## 2013-06-11 DIAGNOSIS — M797 Fibromyalgia: Secondary | ICD-10-CM

## 2013-06-11 DIAGNOSIS — G8929 Other chronic pain: Secondary | ICD-10-CM

## 2013-06-11 DIAGNOSIS — F411 Generalized anxiety disorder: Secondary | ICD-10-CM

## 2013-06-11 DIAGNOSIS — R635 Abnormal weight gain: Secondary | ICD-10-CM

## 2013-06-11 DIAGNOSIS — M25569 Pain in unspecified knee: Secondary | ICD-10-CM

## 2013-06-11 DIAGNOSIS — J45909 Unspecified asthma, uncomplicated: Secondary | ICD-10-CM

## 2013-06-11 DIAGNOSIS — R5381 Other malaise: Secondary | ICD-10-CM

## 2013-06-11 DIAGNOSIS — J4540 Moderate persistent asthma, uncomplicated: Secondary | ICD-10-CM

## 2013-06-11 MED ORDER — OXYCODONE HCL ER 60 MG PO T12A: 1.0000 | EXTENDED_RELEASE_TABLET | Freq: Two times a day (BID) | ORAL | Status: DC

## 2013-06-11 MED ORDER — OXYCODONE HCL 5 MG PO TABS: 5.0000 mg | ORAL_TABLET | ORAL | Status: DC | PRN

## 2013-06-11 MED ORDER — OXYMORPHONE HCL ER 15 MG PO TB12: ORAL_TABLET | ORAL | Status: DC

## 2013-06-11 MED ORDER — ZOLPIDEM TARTRATE 10 MG PO TABS: 10.0000 mg | ORAL_TABLET | Freq: Every day | ORAL | Status: DC

## 2013-06-11 NOTE — Progress Notes (Signed)
OFFICE NOTE  06/22/2013  CC:  Chief Complaint  Patient presents with  . Follow-up     HPI: Patient is a 47 y.o. Caucasian female who is here for 1 mo f/u fibromyalgia/chronic pain syndrome requiring chronic narcotic treatment. Knees no better and no worse since last visit. Only one febrile "episode" since last visit.   Oxycontin and oxycodone help the same as usual but she is continuously dissatisfied with pain control: still 7/10 intensity.  She says she feels back to baseline regarding lung functin/cough after recently having CAP.  She is on medicare advantage plan with [employer] now and can't be on advair, phenergan, oxycontin, and soma anymore. Her plan allows baclofen for muscle relaxer.  Pertinent PMH:  Past Medical History  Diagnosis Date  . DDD (degenerative disc disease), lumbar   . Atypical chest pain     Question of coronary vasospasm; cath 10/20/10 NORMAL  . Anxiety   . Fibromyalgia     Dr. Hope Pigeon [phone]  . Interstitial cystitis   . Obesity   . Insomnia   . Transaminasemia 03/2010    (ALT 49), normal on f/u testing off of daily tylenol uuse.  Marland Kitchen GERD (gastroesophageal reflux disease) 06/01/10    Dyspepsia/gastritis, EGD 05/2010: gastritis (NSAIDS)  . Rectal bleeding 06/01/10    Anorectal bleeding/BRBPR--colonoscopy: internal hemorrhoids  . Recurrent UTI     renal u/s normal 07/2007  . Weight loss     CT abd/pelf 08/22/10 remakable only for bile duct 9mm (unchanged from 03/2004 PRIOR to ERCP w/spincterotomy, bile duct balloon dredging and lap chole)  . Recurrent fever     W/u unrevealing, including ID consult.    . Asthma   . Osteopenia     bone densitometry 2006 per pt  . 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 Christella Hartigan  . Multinodular goiter 11/2010    TSH normal  . Chest pain   . Rectal prolapse 02/2011    repaired lap [id number]  . Vitamin D deficiency 08/20/2011  . History of migraine headaches     Dr. Anne Hahn at [facility]  Neuro 03/2012: started nortriptyline 10mg --titrating to 3 caps qhs  . Edema 10/31/2011  . Lobar pneumonia mid May, 2013    RML and RLL, with sepsis--admission to [facility] 12/2011--responded extremely well to rocephin + zithromax.  . IBS (irritable bowel syndrome)   . De Quervain's tenosynovitis, left 11/11/2011  . Anemia, iron deficiency 06/14/2011    Hemoccults NEG x 3 in December 2012   . CAP (community acquired pneumonia) 04/29/2013   Past Surgical History  Procedure Laterality Date  . Bunionectomy      right foot  . Abdominal hysterectomy    . Bilateral salpingoophorectomy  2003    endometriosis  . Cholecystectomy  2005    lap--(cholelithiasis w/choledocholithiasis on u/s 2005--PreOperative ERCP showed NO stone or other obstruction in the biliary tract, but CBD messured 9mm near head of pancreas.  S sphincterotomy and bile duct balloon dredging was done at that time.  MRCP 10/2010 NORMAL.  Marland Kitchen Orif metatarsal fracture  2006    Right 2nd and 3rd metatarsals (Dr. Romeo Apple)  . Biopsy thyroid  11/2010    Fine needle: NONmalignant goiter  . Rectal prolapse repair, rectopexy  05July2012    with sigmoid colectomy    MEDS:  Outpatient Prescriptions Prior to Visit  Medication Sig Dispense Refill  . albuterol (PROAIR HFA) 108 (90 BASE) MCG/ACT inhaler Inhale 2 puffs into the lungs every 4 (four) hours  as needed. Shortness of breath  1 Inhaler  1  . amLODipine (NORVASC) 5 MG tablet Take 1 tablet (5 mg total) by mouth daily.  90 tablet  1  . Calcium Carbonate (CALCIUM 500 PO) Take 1 tablet by mouth daily.       . carisoprodol (SOMA) 250 MG tablet TAKE 1 TABLET BY MOUTH 3 TIMES A DAY  270 tablet  0  . clonazePAM (KLONOPIN) 1 MG tablet 1 tab po bid prn  180 tablet  1  . Fish Oil OIL Take 2 capsules by mouth daily.       . Fluticasone-Salmeterol (ADVAIR) 500-50 MCG/DOSE AEPB Inhale 1 puff into the lungs 2 (two) times daily.  60 each  6  . hyoscyamine (LEVSIN SL) 0.125 MG SL tablet Take 1-2 tablets  (0.125-0.25 mg total) by mouth every 6 (six) hours as needed for cramping.  270 tablet  1  . MAGNESIUM PO Take 1 tablet by mouth daily. Unknown strength      . montelukast (SINGULAIR) 10 MG tablet Take 1 tablet (10 mg total) by mouth at bedtime.  30 tablet  6  . ondansetron (ZOFRAN-ODT) 8 MG disintegrating tablet Take 1 tablet (8 mg total) by mouth every 8 (eight) hours as needed. For nausea  270 tablet  1  . phenazopyridine (PYRIDIUM) 200 MG tablet Take 1 tablet (200 mg total) by mouth 3 (three) times daily as needed for pain.  90 tablet  2  . polyethylene glycol powder (GLYCOLAX/MIRALAX) powder DISSOLVE 1 CAPFUL INTO LIQUID AND TAKE BY MOUTH ONE TO THREE TIMES DAILY AS NEEDED FOR CONSTIPATION  1581 g  3  . pramipexole (MIRAPEX) 0.125 MG tablet Take 3 tabs po qhs  270 tablet  1  . promethazine (PHENERGAN) 25 MG tablet Take 1 tablet (25 mg total) by mouth every 6 (six) hours as needed for nausea.  60 tablet  1  . ranitidine (ZANTAC) 150 MG tablet Take 1 tablet (150 mg total) by mouth 2 (two) times daily.  180 tablet  2  . sucralfate (CARAFATE) 1 G tablet 1 tab about 1 hour prior to each meal prn      . SUMAtriptan (IMITREX) 50 MG tablet Take 2 tablets (100 mg total) by mouth every 2 (two) hours as needed (migraine HA (max of 200mg /24h).  27 tablet  1  . oxyCODONE (OXY IR/ROXICODONE) 5 MG immediate release tablet Take 1-2 tablets (5-10 mg total) by mouth every 4 (four) hours as needed.  300 tablet  0  . OxyCODONE HCl ER (OXYCONTIN) 60 MG T12A Take 1 tablet by mouth 2 (two) times daily.  60 each  0  . zolpidem (AMBIEN) 10 MG tablet Take 1 tablet (10 mg total) by mouth at bedtime.  90 tablet  0  . levofloxacin (LEVAQUIN) 750 MG tablet Take 1 tablet (750 mg total) by mouth daily.  10 tablet  0  . predniSONE (DELTASONE) 20 MG tablet 3 tabs po qd x 5d then 2 tabs po qd x 5d, then 1 tab po qd x 5d, then 1/2 tab po qd x 4d, then stop.  32 tablet  0   No facility-administered medications prior to visit.  Pt  no longer taking the prednisone or levaquin as listed above  PE: Blood pressure 131/99, pulse 120, temperature 98.2 F (36.8 C), temperature source Temporal, resp. rate 20, height 5' 4.5" (1.638 m), weight 207 lb (93.895 kg), SpO2 97.00%. Gen: Alert, well appearing.  Patient is oriented to person, place,  time, and situation. Eyes: no icterus or injection. No facial rash.  Oral: no lesions. Neck: no LAD. CV: Regular, tachy to 100 range when I examine her.  No m/r/g Chest is clear, no wheezing or rales. Normal symmetric air entry throughout both lung fields. No chest wall deformities or tenderness. EXT: no c/c/e MUSC: she is tender to palpation in every single soft tissue area on her body.  Also tender in lateral regions of both hips and diffusely over both knees.  No erythema, swelling, or warmth of any joints.  IMPRESSION AND PLAN:  FIBROMYALGIA, SEVERE She is disabled from this condition. Unfortunately we've had to resort to narcotics for pain control/functionality.   She understands the small benefit/potential large risks of these meds used for her particular condition and she elects to continue to pursue with this treatment. Due to insurance formulary changes we must change her from oxycontin to opana ER 15mg , 1 bid, #60.  May need to increase this to 30mg  bid in near future. She'll continue oxycodone for breakthrough pain, rx today.  Asthma, moderate persistent The current medical regimen is effective;  continue present plan and medications.   Knee pain, chronic X-rays have been normal. Joint fluid analysis in recent past showed only mild inflammatory cell count c/w osteoarthritis.  No crystals and she has never had any signs of inflammitory arthritis.  ANXIETY STATE, UNSPECIFIED Continue benzo. She has been on multiple antidepressants in the past and declines trial of any further meds at this time. She has always been resistant to psychologist/counseling/psychiatrist  involvement.   She'll have to contact her insurer about potential replacements for advair and phenergan. I'm ok with replacing soma with a trial of baclofen when her current soma supply runs out.  She declined flu vaccine today.  FOLLOW UP: 1 mo

## 2013-06-22 ENCOUNTER — Encounter: Payer: Self-pay | Admitting: Family Medicine

## 2013-06-22 NOTE — Assessment & Plan Note (Signed)
X-rays have been normal. Joint fluid analysis in recent past showed only mild inflammatory cell count c/w osteoarthritis.  No crystals and she has never had any signs of inflammitory arthritis.

## 2013-06-22 NOTE — Assessment & Plan Note (Signed)
She is disabled from this condition. Unfortunately we've had to resort to narcotics for pain control/functionality.   She understands the small benefit/potential large risks of these meds used for her particular condition and she elects to continue to pursue with this treatment. Due to insurance formulary changes we must change her from oxycontin to opana ER 15mg , 1 bid, #60.  May need to increase this to 30mg  bid in near future. She'll continue oxycodone for breakthrough pain, rx today.

## 2013-06-22 NOTE — Assessment & Plan Note (Signed)
The current medical regimen is effective;  continue present plan and medications.  

## 2013-06-22 NOTE — Assessment & Plan Note (Signed)
Continue benzo. She has been on multiple antidepressants in the past and declines trial of any further meds at this time. She has always been resistant to psychologist/counseling/psychiatrist involvement.

## 2013-07-03 ENCOUNTER — Telehealth: Payer: Self-pay | Admitting: *Deleted

## 2013-07-03 ENCOUNTER — Other Ambulatory Visit: Payer: Self-pay | Admitting: *Deleted

## 2013-07-03 NOTE — Telephone Encounter (Signed)
Patient's daughter left vm wanting to know if the nasal spray that you advised patient to use is OTC? Daughter Alichia Alridge would a call back at [phone]. Please advise?

## 2013-07-03 NOTE — Telephone Encounter (Signed)
Sorry the message below is an error.

## 2013-07-03 NOTE — Telephone Encounter (Signed)
I don't recall anything about this and I made no note of it in my charting from last visit. Pls ask PATIENT (not daughter) if she can give more specific information for me.  -Thx

## 2013-07-03 NOTE — Telephone Encounter (Signed)
Patient is requesting 3 month supply. Patient also wants refill on her muscle relaxer.   Refill request for Levsin Last filled by MD on - 01/24/13 #270 x1 Last Appt-06/11/13 Next Appt- 07/07/13  Refill request for Phenergan Last filled by MD on - 06/05/13 #60 x1 Last Appt- 06/11/13 Next Appt- 07/07/13 Please advise refill?

## 2013-07-06 ENCOUNTER — Other Ambulatory Visit: Payer: Self-pay | Admitting: Family Medicine

## 2013-07-06 MED ORDER — HYOSCYAMINE SULFATE 0.125 MG SL SUBL: 0.1250 mg | SUBLINGUAL_TABLET | Freq: Four times a day (QID) | SUBLINGUAL | Status: AC | PRN

## 2013-07-06 MED ORDER — PROMETHAZINE HCL 25 MG PO TABS: 25.0000 mg | ORAL_TABLET | Freq: Four times a day (QID) | ORAL | Status: DC | PRN

## 2013-07-06 MED ORDER — CARISOPRODOL 250 MG PO TABS: ORAL_TABLET | ORAL | Status: DC

## 2013-07-07 ENCOUNTER — Ambulatory Visit (INDEPENDENT_AMBULATORY_CARE_PROVIDER_SITE_OTHER): Payer: BC Managed Care – PPO | Admitting: Family Medicine

## 2013-07-07 ENCOUNTER — Encounter: Payer: Self-pay | Admitting: Family Medicine

## 2013-07-07 VITALS — BP 128/87 | HR 108 | Temp 99.8°F | Resp 18 | Ht 64.5 in | Wt 204.0 lb

## 2013-07-07 DIAGNOSIS — E042 Nontoxic multinodular goiter: Secondary | ICD-10-CM

## 2013-07-07 DIAGNOSIS — M797 Fibromyalgia: Secondary | ICD-10-CM

## 2013-07-07 DIAGNOSIS — J019 Acute sinusitis, unspecified: Secondary | ICD-10-CM

## 2013-07-07 DIAGNOSIS — J45909 Unspecified asthma, uncomplicated: Secondary | ICD-10-CM

## 2013-07-07 DIAGNOSIS — R5381 Other malaise: Secondary | ICD-10-CM

## 2013-07-07 DIAGNOSIS — M2550 Pain in unspecified joint: Secondary | ICD-10-CM

## 2013-07-07 DIAGNOSIS — A689 Relapsing fever, unspecified: Secondary | ICD-10-CM

## 2013-07-07 DIAGNOSIS — IMO0001 Reserved for inherently not codable concepts without codable children: Secondary | ICD-10-CM

## 2013-07-07 DIAGNOSIS — J4540 Moderate persistent asthma, uncomplicated: Secondary | ICD-10-CM

## 2013-07-07 MED ORDER — OXYCODONE HCL 5 MG PO TABS: 5.0000 mg | ORAL_TABLET | ORAL | Status: DC | PRN

## 2013-07-07 MED ORDER — AMOXICILLIN 875 MG PO TABS: 875.0000 mg | ORAL_TABLET | Freq: Two times a day (BID) | ORAL | Status: DC

## 2013-07-07 MED ORDER — CYCLOBENZAPRINE HCL 10 MG PO TABS: 10.0000 mg | ORAL_TABLET | Freq: Three times a day (TID) | ORAL | Status: DC | PRN

## 2013-07-07 MED ORDER — FLUCONAZOLE 150 MG PO TABS: 150.0000 mg | ORAL_TABLET | Freq: Once | ORAL | Status: DC

## 2013-07-07 MED ORDER — OXYMORPHONE HCL ER 30 MG PO T12A: EXTENDED_RELEASE_TABLET | ORAL | Status: DC

## 2013-07-07 NOTE — Progress Notes (Signed)
OFFICE NOTE  07/07/2013  CC:  Chief Complaint  Patient presents with  . Follow-up     HPI: Patient is a 47 y.o. Caucasian female who is here for f/u chronic pain syndrome--severe fibromyalgia. Pain control is not as adequate on opana ER 15mg  bid as when she was on oxycontin.    Has had nasal congestion/sinus pressure, PND, for about the last 1 month and things don't seem to be clearing up.  Minimal coughing.  More morning nausea lately than normal for her. Has run out of advair, working with new insurer to get formulary for replacement.   Pertinent PMH:  Past Medical History  Diagnosis Date  . DDD (degenerative disc disease), lumbar   . Atypical chest pain     Question of coronary vasospasm; cath 10/20/10 NORMAL  . Anxiety   . Fibromyalgia     Dr. Hope Pigeon [phone]  . Interstitial cystitis   . Obesity   . Insomnia   . Transaminasemia 03/2010    (ALT 49), normal on f/u testing off of daily tylenol uuse.  Marland Kitchen GERD (gastroesophageal reflux disease) 06/01/10    Dyspepsia/gastritis, EGD 05/2010: gastritis (NSAIDS)  . Rectal bleeding 06/01/10    Anorectal bleeding/BRBPR--colonoscopy: internal hemorrhoids  . Recurrent UTI     renal u/s normal 07/2007  . Weight loss     CT abd/pelf 08/22/10 remakable only for bile duct 9mm (unchanged from 03/2004 PRIOR to ERCP w/spincterotomy, bile duct balloon dredging and lap chole)  . Recurrent fever     W/u unrevealing, including ID consult.    . Asthma   . Osteopenia     bone densitometry 2006 per pt  . 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 Christella Hartigan  . Multinodular goiter 11/2010    TSH normal  . Chest pain   . Rectal prolapse 02/2011    repaired lap [id number]  . Vitamin D deficiency 08/20/2011  . History of migraine headaches     Dr. Anne Hahn at [facility] 03/2012: started nortriptyline 10mg --titrating to 3 caps qhs  . Edema 10/31/2011  . Lobar pneumonia mid May, 2013    RML and RLL, with sepsis--admission to  [facility] 12/2011--responded extremely well to rocephin + zithromax.  . IBS (irritable bowel syndrome)   . De Quervain's tenosynovitis, left 11/11/2011  . Anemia, iron deficiency 06/14/2011    Hemoccults NEG x 3 in December 2012   . CAP (community acquired pneumonia) 04/29/2013   Past surgical, social, and family history reviewed and no changes noted since last office visit.  MEDS:  Outpatient Prescriptions Prior to Visit  Medication Sig Dispense Refill  . albuterol (PROAIR HFA) 108 (90 BASE) MCG/ACT inhaler Inhale 2 puffs into the lungs every 4 (four) hours as needed. Shortness of breath  1 Inhaler  1  . amLODipine (NORVASC) 5 MG tablet Take 1 tablet (5 mg total) by mouth daily.  90 tablet  1  . Calcium Carbonate (CALCIUM 500 PO) Take 1 tablet by mouth daily.       . carisoprodol (SOMA) 250 MG tablet TAKE 1 TABLET BY MOUTH 3 TIMES A DAY  270 tablet  0  . clonazePAM (KLONOPIN) 1 MG tablet 1 tab po bid prn  180 tablet  1  . Fish Oil OIL Take 2 capsules by mouth daily.       . Fluticasone-Salmeterol (ADVAIR) 500-50 MCG/DOSE AEPB Inhale 1 puff into the lungs 2 (two) times daily.  60 each  6  . hyoscyamine (LEVSIN SL)  0.125 MG SL tablet Take 1-2 tablets (0.125-0.25 mg total) by mouth every 6 (six) hours as needed for cramping.  270 tablet  1  . MAGNESIUM PO Take 1 tablet by mouth daily. Unknown strength      . montelukast (SINGULAIR) 10 MG tablet Take 1 tablet (10 mg total) by mouth at bedtime.  30 tablet  6  . ondansetron (ZOFRAN-ODT) 8 MG disintegrating tablet Take 1 tablet (8 mg total) by mouth every 8 (eight) hours as needed. For nausea  270 tablet  1  . oxyCODONE (OXY IR/ROXICODONE) 5 MG immediate release tablet Take 1-2 tablets (5-10 mg total) by mouth every 4 (four) hours as needed.  300 tablet  0  . oxymorphone (OPANA ER) 15 MG 12 hr tablet 1 tab po bid  60 tablet  0  . phenazopyridine (PYRIDIUM) 200 MG tablet Take 1 tablet (200 mg total) by mouth 3 (three) times daily as needed for pain.  90  tablet  2  . polyethylene glycol powder (GLYCOLAX/MIRALAX) powder DISSOLVE 1 CAPFUL INTO LIQUID AND TAKE BY MOUTH ONE TO THREE TIMES DAILY AS NEEDED FOR CONSTIPATION  1581 g  3  . pramipexole (MIRAPEX) 0.125 MG tablet Take 3 tabs po qhs  270 tablet  1  . promethazine (PHENERGAN) 25 MG tablet Take 1 tablet (25 mg total) by mouth every 6 (six) hours as needed for nausea.  180 tablet  1  . ranitidine (ZANTAC) 150 MG tablet Take 1 tablet (150 mg total) by mouth 2 (two) times daily.  180 tablet  2  . sucralfate (CARAFATE) 1 G tablet 1 tab about 1 hour prior to each meal prn      . SUMAtriptan (IMITREX) 50 MG tablet Take 2 tablets (100 mg total) by mouth every 2 (two) hours as needed (migraine HA (max of 200mg /24h).  27 tablet  1  . zolpidem (AMBIEN) 10 MG tablet Take 1 tablet (10 mg total) by mouth at bedtime.  90 tablet  0  . OxyCODONE HCl ER (OXYCONTIN) 60 MG T12A Take 1 tablet by mouth 2 (two) times daily.  60 each  0   No facility-administered medications prior to visit.    PE: Blood pressure 128/87, pulse 108, temperature 99.8 F (37.7 C), temperature source Temporal, resp. rate 18, height 5' 4.5" (1.638 m), weight 204 lb (92.534 kg), SpO2 96.00%. VS: noted--normal. Gen: alert, NAD, NONTOXIC APPEARING. HEENT: eyes without injection, drainage, or swelling.  Ears: EACs clear, TMs with normal light reflex and landmarks.  Nose: Clear rhinorrhea, with some dried, crusty exudate adherent to mildly injected mucosa.  No purulent d/c.  Mild paranasal sinus TTP, L>R.  No facial swelling.  Throat and mouth without focal lesion.  No pharyngial swelling, erythema, or exudate.   Neck: supple, no LAD.   LUNGS: CTA bilat, nonlabored resps.   CV: RRR, no m/r/g. EXT: no c/c/e SKIN: no rash  IMPRESSION AND PLAN:  FIBROMYALGIA, SEVERE Inadequate pain control after recent insurer-mandated switch from oxycontin to opana ER 15mg  bid. Will increase to opana ER 30mg  bid. RF oxycodone 5mg  tabs for breakthrough  pain, #300, no RF. Flexeril 10mg  q8h prn, #270, no Rf--to be filled/started if the recent soma rx I did for her is too expensive for her to pay out of pocket for (yet another formulary change).  Sinusitis, acute Amoxil 875mg  bid x 10d. Symptomatic care discussed.   An After Visit Summary was printed and given to the patient.  FOLLOW UP: 1 mo

## 2013-07-09 ENCOUNTER — Other Ambulatory Visit: Payer: Self-pay | Admitting: *Deleted

## 2013-07-09 MED ORDER — PRAMIPEXOLE DIHYDROCHLORIDE 0.125 MG PO TABS: ORAL_TABLET | ORAL | Status: DC

## 2013-07-12 DIAGNOSIS — J019 Acute sinusitis, unspecified: Secondary | ICD-10-CM | POA: Insufficient documentation

## 2013-07-12 NOTE — Assessment & Plan Note (Signed)
Amoxil 875mg  bid x 10d. Symptomatic care discussed.

## 2013-07-12 NOTE — Assessment & Plan Note (Signed)
Inadequate pain control after recent insurer-mandated switch from oxycontin to opana ER 15mg  bid. Will increase to opana ER 30mg  bid. RF oxycodone 5mg  tabs for breakthrough pain, #300, no RF. Flexeril 10mg  q8h prn, #270, no Rf--to be filled/started if the recent soma rx I did for her is too expensive for her to pay out of pocket for (yet another formulary change).

## 2013-08-07 ENCOUNTER — Ambulatory Visit (INDEPENDENT_AMBULATORY_CARE_PROVIDER_SITE_OTHER): Payer: Medicare PPO | Admitting: Family Medicine

## 2013-08-07 ENCOUNTER — Encounter: Payer: Self-pay | Admitting: Family Medicine

## 2013-08-07 VITALS — BP 135/92 | HR 119 | Temp 98.8°F | Resp 18 | Ht 64.5 in | Wt 210.0 lb

## 2013-08-07 DIAGNOSIS — E042 Nontoxic multinodular goiter: Secondary | ICD-10-CM

## 2013-08-07 DIAGNOSIS — R5381 Other malaise: Secondary | ICD-10-CM

## 2013-08-07 DIAGNOSIS — J45909 Unspecified asthma, uncomplicated: Secondary | ICD-10-CM

## 2013-08-07 DIAGNOSIS — IMO0001 Reserved for inherently not codable concepts without codable children: Secondary | ICD-10-CM

## 2013-08-07 DIAGNOSIS — A689 Relapsing fever, unspecified: Secondary | ICD-10-CM

## 2013-08-07 DIAGNOSIS — R5383 Other fatigue: Secondary | ICD-10-CM

## 2013-08-07 DIAGNOSIS — M797 Fibromyalgia: Secondary | ICD-10-CM

## 2013-08-07 DIAGNOSIS — J4540 Moderate persistent asthma, uncomplicated: Secondary | ICD-10-CM

## 2013-08-07 DIAGNOSIS — M2550 Pain in unspecified joint: Secondary | ICD-10-CM

## 2013-08-07 MED ORDER — OXYCODONE HCL 5 MG PO TABS
5.0000 mg | ORAL_TABLET | ORAL | Status: DC | PRN
Start: 2013-08-07 — End: 2013-10-07

## 2013-08-07 MED ORDER — OXYCODONE HCL 5 MG PO TABS: 5.0000 mg | ORAL_TABLET | ORAL | Status: DC | PRN

## 2013-08-07 MED ORDER — OXYMORPHONE HCL ER 30 MG PO T12A: EXTENDED_RELEASE_TABLET | ORAL | Status: DC

## 2013-08-07 NOTE — Progress Notes (Signed)
Pre visit review using our clinic review tool, if applicable. No additional management support is needed unless otherwise documented below in the visit note. 

## 2013-08-07 NOTE — Progress Notes (Signed)
OFFICE NOTE  08/07/2013  CC:  Chief Complaint  Patient presents with  . Follow-up     HPI: Patient is a 48 y.o. Caucasian female who is here for 1 mo f/u fibromyalgia, chronic fatigue, anxiety. Reports fibromyalgia pain is stable.  Pain control improved now that she is on the opana ER 30mg  bid.  Her asthma is "much better compared to the fall".  Anxiety is stable.  Pertinent PMH:  Past Medical History  Diagnosis Date  . DDD (degenerative disc disease), lumbar   . Atypical chest pain     Question of coronary vasospasm; cath 10/20/10 NORMAL  . Anxiety   . Fibromyalgia     Dr. Hope Pigeon [phone]  . Interstitial cystitis   . Obesity   . Insomnia   . Transaminasemia 03/2010    (ALT 49), normal on f/u testing off of daily tylenol uuse.  Marland Kitchen GERD (gastroesophageal reflux disease) 06/01/10    Dyspepsia/gastritis, EGD 05/2010: gastritis (NSAIDS)  . Rectal bleeding 06/01/10    Anorectal bleeding/BRBPR--colonoscopy: internal hemorrhoids  . Recurrent UTI     renal u/s normal 07/2007  . Weight loss     CT abd/pelf 08/22/10 remakable only for bile duct 9mm (unchanged from 03/2004 PRIOR to ERCP w/spincterotomy, bile duct balloon dredging and lap chole)  . Recurrent fever     W/u unrevealing, including ID consult.    . Asthma   . Osteopenia     bone densitometry 2006 per pt  . 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 Christella Hartigan  . Multinodular goiter 11/2010    TSH normal  . Chest pain   . Rectal prolapse 02/2011    repaired lap [id number]  . Vitamin D deficiency 08/20/2011  . History of migraine headaches     Dr. Anne Hahn at [facility] 03/2012: started nortriptyline 10mg --titrating to 3 caps qhs  . Edema 10/31/2011  . Lobar pneumonia mid May, 2013    RML and RLL, with sepsis--admission to [facility] 12/2011--responded extremely well to rocephin + zithromax.  . IBS (irritable bowel syndrome)   . De Quervain's tenosynovitis, left 11/11/2011  . Anemia, iron deficiency  06/14/2011    Hemoccults NEG x 3 in December 2012   . CAP (community acquired pneumonia) 04/29/2013   Past Surgical History  Procedure Laterality Date  . Bunionectomy      right foot  . Abdominal hysterectomy    . Bilateral salpingoophorectomy  2003    endometriosis  . Cholecystectomy  2005    lap--(cholelithiasis w/choledocholithiasis on u/s 2005--PreOperative ERCP showed NO stone or other obstruction in the biliary tract, but CBD messured 9mm near head of pancreas.  S sphincterotomy and bile duct balloon dredging was done at that time.  MRCP 10/2010 NORMAL.  Marland Kitchen Orif metatarsal fracture  2006    Right 2nd and 3rd metatarsals (Dr. Romeo Apple)  . Biopsy thyroid  11/2010    Fine needle: NONmalignant goiter  . Rectal prolapse repair, rectopexy  05July2012    with sigmoid colectomy    MEDS:  Outpatient Prescriptions Prior to Visit  Medication Sig Dispense Refill  . albuterol (PROAIR HFA) 108 (90 BASE) MCG/ACT inhaler Inhale 2 puffs into the lungs every 4 (four) hours as needed. Shortness of breath  1 Inhaler  1  . amLODipine (NORVASC) 5 MG tablet Take 1 tablet (5 mg total) by mouth daily.  90 tablet  1  . Calcium Carbonate (CALCIUM 500 PO) Take 1 tablet by mouth daily.       Marland Kitchen  clonazePAM (KLONOPIN) 1 MG tablet 1 tab po bid prn  180 tablet  1  . cyclobenzaprine (FLEXERIL) 10 MG tablet Take 1 tablet (10 mg total) by mouth 3 (three) times daily as needed for muscle spasms.  270 tablet  0  . Fish Oil OIL Take 2 capsules by mouth daily.       . Fluticasone-Salmeterol (ADVAIR) 500-50 MCG/DOSE AEPB Inhale 1 puff into the lungs 2 (two) times daily.  60 each  6  . hyoscyamine (LEVSIN SL) 0.125 MG SL tablet Take 1-2 tablets (0.125-0.25 mg total) by mouth every 6 (six) hours as needed for cramping.  270 tablet  1  . MAGNESIUM PO Take 1 tablet by mouth daily. Unknown strength      . montelukast (SINGULAIR) 10 MG tablet Take 1 tablet (10 mg total) by mouth at bedtime.  30 tablet  6  . ondansetron  (ZOFRAN-ODT) 8 MG disintegrating tablet Take 1 tablet (8 mg total) by mouth every 8 (eight) hours as needed. For nausea  270 tablet  1  . oxyCODONE (OXY IR/ROXICODONE) 5 MG immediate release tablet Take 1-2 tablets (5-10 mg total) by mouth every 4 (four) hours as needed.  300 tablet  0  . Oxymorphone HCl, Crush Resist, (OPANA ER, CRUSH RESISTANT,) 30 MG T12A 1 tab po q12h for chronic pain  60 tablet  0  . phenazopyridine (PYRIDIUM) 200 MG tablet Take 1 tablet (200 mg total) by mouth 3 (three) times daily as needed for pain.  90 tablet  2  . polyethylene glycol powder (GLYCOLAX/MIRALAX) powder DISSOLVE 1 CAPFUL INTO LIQUID AND TAKE BY MOUTH ONE TO THREE TIMES DAILY AS NEEDED FOR CONSTIPATION  1581 g  3  . promethazine (PHENERGAN) 25 MG tablet Take 1 tablet (25 mg total) by mouth every 6 (six) hours as needed for nausea.  180 tablet  1  . ranitidine (ZANTAC) 150 MG tablet Take 1 tablet (150 mg total) by mouth 2 (two) times daily.  180 tablet  2  . sucralfate (CARAFATE) 1 G tablet 1 tab about 1 hour prior to each meal prn      . SUMAtriptan (IMITREX) 50 MG tablet Take 2 tablets (100 mg total) by mouth every 2 (two) hours as needed (migraine HA (max of 200mg /24h).  27 tablet  1  . zolpidem (AMBIEN) 10 MG tablet Take 1 tablet (10 mg total) by mouth at bedtime.  90 tablet  0  . pramipexole (MIRAPEX) 0.125 MG tablet Take 3 tabs po qhs  270 tablet  1  . amoxicillin (AMOXIL) 875 MG tablet Take 1 tablet (875 mg total) by mouth 2 (two) times daily.  20 tablet  0  . fluconazole (DIFLUCAN) 150 MG tablet Take 1 tablet (150 mg total) by mouth once.  1 tablet  1   No facility-administered medications prior to visit.    PE: Blood pressure 135/92, pulse 119, temperature 98.8 F (37.1 C), temperature source Temporal, resp. rate 18, height 5' 4.5" (1.638 m), weight 210 lb (95.255 kg), SpO2 98.00%. Gen: Alert, well appearing.  Patient is oriented to person, place, time, and situation. AFFECT: pleasant, lucid thought  and speech. No further exam today.  IMPRESSION AND PLAN:  Fibromyalgia: chronic pain control stable.  Continue all current meds. I printed rx's for opana ER 30mg  bid and oxycodone 5mg , #300 today for this month and for Feb 2015.  Appropriate fill on/after date was noted on each rx.  Asthma stable; The current medical regimen is effective;  continue present plan and medications.  FOLLOW UP: 2 mo

## 2013-09-11 ENCOUNTER — Other Ambulatory Visit: Payer: Self-pay | Admitting: Family Medicine

## 2013-09-11 MED ORDER — ALBUTEROL SULFATE HFA 108 (90 BASE) MCG/ACT IN AERS: 2.0000 | INHALATION_SPRAY | RESPIRATORY_TRACT | Status: DC | PRN

## 2013-09-11 NOTE — Telephone Encounter (Signed)
Patient called stating that her asthma has been out of control and she doesn't have any proair left and she states that it doesn't seem to be helping like it used to.  Patient called her insurance and they told her that albuterol sulfate neb is an approved medication for patient.  Patient says she's wheezing and SOB.  Please advise.

## 2013-09-11 NOTE — Telephone Encounter (Signed)
Patient now has changed her mind and states she only needs her inhaler refilled.  Proair sent to pharmacy.

## 2013-09-11 NOTE — Telephone Encounter (Signed)
At 4pm on a Friday the best thing to do is go to a local urgent care or ED. Getting switched to a nebulizer is doable but requires certain pharmacies or medical supply stores AND it is not really the appropriate step if she is feeling short of breath and wheezing. Had she notified us a little earlier we could have worked with her some. Sorry.

## 2013-09-15 ENCOUNTER — Other Ambulatory Visit: Payer: Self-pay | Admitting: Family Medicine

## 2013-09-17 ENCOUNTER — Telehealth: Payer: Self-pay | Admitting: Family Medicine

## 2013-09-17 DIAGNOSIS — M2550 Pain in unspecified joint: Secondary | ICD-10-CM

## 2013-09-17 DIAGNOSIS — R5381 Other malaise: Secondary | ICD-10-CM

## 2013-09-17 DIAGNOSIS — A689 Relapsing fever, unspecified: Secondary | ICD-10-CM

## 2013-09-17 DIAGNOSIS — R5383 Other fatigue: Secondary | ICD-10-CM

## 2013-09-17 DIAGNOSIS — E042 Nontoxic multinodular goiter: Secondary | ICD-10-CM

## 2013-09-17 DIAGNOSIS — M797 Fibromyalgia: Secondary | ICD-10-CM

## 2013-09-17 DIAGNOSIS — R635 Abnormal weight gain: Secondary | ICD-10-CM

## 2013-09-17 DIAGNOSIS — IMO0001 Reserved for inherently not codable concepts without codable children: Secondary | ICD-10-CM

## 2013-09-17 MED ORDER — ZOLPIDEM TARTRATE 10 MG PO TABS: 10.0000 mg | ORAL_TABLET | Freq: Every day | ORAL | Status: DC

## 2013-09-17 NOTE — Telephone Encounter (Signed)
Ok to refill 90 tabs 0 refill.

## 2013-09-17 NOTE — Telephone Encounter (Signed)
Patient called stating that she took [place] last [place] and needs a refill today.  Patient last rx was printed on 06/11/13 # 90 tabs.  Please advise refill.

## 2013-09-17 NOTE — Telephone Encounter (Signed)
Rx printed and faxed to [facility]  

## 2013-09-17 NOTE — Telephone Encounter (Signed)
Looks like Layne filled this today. Tell her thanks.

## 2013-10-05 ENCOUNTER — Ambulatory Visit: Payer: Medicare Other | Admitting: Family Medicine

## 2013-10-07 ENCOUNTER — Ambulatory Visit (INDEPENDENT_AMBULATORY_CARE_PROVIDER_SITE_OTHER): Payer: Medicare Other | Admitting: Family Medicine

## 2013-10-07 ENCOUNTER — Encounter: Payer: Self-pay | Admitting: Family Medicine

## 2013-10-07 VITALS — BP 137/91 | HR 122 | Temp 99.3°F | Resp 18 | Ht 64.5 in | Wt 214.0 lb

## 2013-10-07 DIAGNOSIS — M2550 Pain in unspecified joint: Secondary | ICD-10-CM

## 2013-10-07 DIAGNOSIS — A689 Relapsing fever, unspecified: Secondary | ICD-10-CM

## 2013-10-07 DIAGNOSIS — J45909 Unspecified asthma, uncomplicated: Secondary | ICD-10-CM

## 2013-10-07 DIAGNOSIS — K219 Gastro-esophageal reflux disease without esophagitis: Secondary | ICD-10-CM

## 2013-10-07 DIAGNOSIS — F411 Generalized anxiety disorder: Secondary | ICD-10-CM

## 2013-10-07 DIAGNOSIS — R110 Nausea: Secondary | ICD-10-CM

## 2013-10-07 DIAGNOSIS — IMO0001 Reserved for inherently not codable concepts without codable children: Secondary | ICD-10-CM

## 2013-10-07 DIAGNOSIS — D509 Iron deficiency anemia, unspecified: Secondary | ICD-10-CM

## 2013-10-07 DIAGNOSIS — R5381 Other malaise: Secondary | ICD-10-CM

## 2013-10-07 DIAGNOSIS — M797 Fibromyalgia: Secondary | ICD-10-CM

## 2013-10-07 DIAGNOSIS — R5383 Other fatigue: Secondary | ICD-10-CM

## 2013-10-07 DIAGNOSIS — J4540 Moderate persistent asthma, uncomplicated: Secondary | ICD-10-CM

## 2013-10-07 DIAGNOSIS — E042 Nontoxic multinodular goiter: Secondary | ICD-10-CM

## 2013-10-07 MED ORDER — OXYCODONE HCL 5 MG PO TABS: 5.0000 mg | ORAL_TABLET | ORAL | Status: DC | PRN

## 2013-10-07 MED ORDER — OXYMORPHONE HCL ER 30 MG PO T12A: EXTENDED_RELEASE_TABLET | ORAL | Status: DC

## 2013-10-07 MED ORDER — ONDANSETRON 8 MG PO TBDP: ORAL_TABLET | ORAL | Status: DC

## 2013-10-07 MED ORDER — CYCLOBENZAPRINE HCL 10 MG PO TABS: 10.0000 mg | ORAL_TABLET | Freq: Three times a day (TID) | ORAL | Status: DC | PRN

## 2013-10-07 MED ORDER — OXYCODONE HCL 5 MG PO TABS
5.0000 mg | ORAL_TABLET | ORAL | Status: DC | PRN
Start: 2013-10-07 — End: 2013-10-07

## 2013-10-07 MED ORDER — RANITIDINE HCL 150 MG PO TABS: 150.0000 mg | ORAL_TABLET | Freq: Two times a day (BID) | ORAL | Status: DC

## 2013-10-07 NOTE — Assessment & Plan Note (Signed)
Problem stable.  Continue current medications and diet appropriate for this condition.  We have reviewed our general long term plan for this problem and also reviewed symptoms and signs that should prompt the patient to call or return to the office.  

## 2013-10-07 NOTE — Assessment & Plan Note (Signed)
Stable on ranitidine 150mg  bid.

## 2013-10-07 NOTE — Progress Notes (Signed)
OFFICE NOTE  10/07/2013  CC:  Chief Complaint  Patient presents with  . Follow-up     HPI: Patient is a 48 y.o. Caucasian female who is here for 2 mo f/u fibromyalgia/chronic pain syndrome, GERD, asthma. Reports cough/resp illness lately with fever, asthma flare, [facility] about a [facility]: got steroid shot, alb nebs, sent home on z-pack and prednisone.  Feeling much better now.  Says pain control is unchanged, doing well on current pain med regimen.  GERD stable on ranitidine 150mg  bid.  Pertinent PMH:  Past medical, surgical, social, and family history reviewed and no changes are noted since last office visit.  MEDS:  Outpatient Prescriptions Prior to Visit  Medication Sig Dispense Refill  . albuterol (PROAIR HFA) 108 (90 BASE) MCG/ACT inhaler Inhale 2 puffs into the lungs every 4 (four) hours as needed. Shortness of breath  1 Inhaler  1  . amLODipine (NORVASC) 5 MG tablet Take 1 tablet (5 mg total) by mouth daily.  90 tablet  1  . Calcium Carbonate (CALCIUM 500 PO) Take 1 tablet by mouth daily.       . clonazePAM (KLONOPIN) 1 MG tablet 1 tab po bid prn  180 tablet  1  . cyclobenzaprine (FLEXERIL) 10 MG tablet Take 1 tablet (10 mg total) by mouth 3 (three) times daily as needed for muscle spasms.  270 tablet  0  . Fish Oil OIL Take 2 capsules by mouth daily.       . Fluticasone-Salmeterol (ADVAIR) 500-50 MCG/DOSE AEPB Inhale 1 puff into the lungs 2 (two) times daily.  60 each  6  . hyoscyamine (LEVSIN SL) 0.125 MG SL tablet Take 1-2 tablets (0.125-0.25 mg total) by mouth every 6 (six) hours as needed for cramping.  270 tablet  1  . MAGNESIUM PO Take 1 tablet by mouth daily. Unknown strength      . montelukast (SINGULAIR) 10 MG tablet Take 1 tablet (10 mg total) by mouth at bedtime.  30 tablet  6  . ondansetron (ZOFRAN-ODT) 8 MG disintegrating tablet Take 1 tablet (8 mg total) by mouth every 8 (eight) hours as needed. For nausea  270 tablet  1  . oxyCODONE (OXY  IR/ROXICODONE) 5 MG immediate release tablet Take 1-2 tablets (5-10 mg total) by mouth every 4 (four) hours as needed.  300 tablet  0  . Oxymorphone HCl, Crush Resist, (OPANA ER, CRUSH RESISTANT,) 30 MG T12A 1 tab po q12h for chronic pain  60 tablet  0  . phenazopyridine (PYRIDIUM) 200 MG tablet Take 1 tablet (200 mg total) by mouth 3 (three) times daily as needed for pain.  90 tablet  2  . polyethylene glycol powder (GLYCOLAX/MIRALAX) powder DISSOLVE 1 CAPFUL INTO LIQUID AND TAKE BY MOUTH ONE TO THREE TIMES DAILY AS NEEDED FOR CONSTIPATION  1581 g  3  . pramipexole (MIRAPEX) 0.125 MG tablet Take 3 tabs po qhs  270 tablet  1  . promethazine (PHENERGAN) 25 MG tablet Take 1 tablet (25 mg total) by mouth every 6 (six) hours as needed for nausea.  180 tablet  1  . ranitidine (ZANTAC) 150 MG tablet Take 1 tablet (150 mg total) by mouth 2 (two) times daily.  180 tablet  2  . sucralfate (CARAFATE) 1 G tablet 1 tab about 1 hour prior to each meal prn      . SUMAtriptan (IMITREX) 50 MG tablet Take 2 tablets (100 mg total) by mouth every 2 (two) hours as needed (  migraine HA (max of 200mg /24h).  27 tablet  1  . zolpidem (AMBIEN) 10 MG tablet Take 1 tablet (10 mg total) by mouth at bedtime.  90 tablet  0   No facility-administered medications prior to visit.    PE: Blood pressure 137/91, pulse 122, temperature 99.3 F (37.4 C), temperature source Temporal, resp. rate 18, height 5' 4.5" (1.638 m), weight 214 lb (97.07 kg), SpO2 95.00%. Gen: Alert, well appearing.  Patient is oriented to person, place, time, and situation. ZOX:WRUE: no injection, icteris, swelling, or exudate.  EOMI, PERRLA. Mouth: lips without lesion/swelling.  Oral mucosa pink and moist. Oropharynx without erythema, exudate, or swelling.  CV: Reg rhythm, rate 110-115, no murmur. Chest is clear, no wheezing or rales. Normal symmetric air entry throughout both lung fields. No chest wall deformities or tenderness.  LABS: none  today  IMPRESSION AND PLAN:  FIBROMYALGIA, SEVERE The current medical regimen is effective;  continue present plan and medications. I printed rx's for oxycodone and opana ER today for this month and the next.  Appropriate fill on/after date was noted on each rx. Controlled substance contract on record, UDS with appropriate results in recent past.   Asthma, moderate persistent Recent acute flare, resolved. Encouraged pt to continue current controller meds but be more cautious in use of albuterol due to her tendency towards elevated HR.  ANXIETY STATE, UNSPECIFIED Problem stable.  Continue current medications and diet appropriate for this condition.  We have reviewed our general long term plan for this problem and also reviewed symptoms and signs that should prompt the patient to call or return to the office.   GERD Stable on ranitidine 150mg  bid.   An After Visit Summary was printed and given to the patient.  FOLLOW UP: 2 mo

## 2013-10-07 NOTE — Assessment & Plan Note (Signed)
Recent acute flare, resolved. Encouraged pt to continue current controller meds but be more cautious in use of albuterol due to her tendency towards elevated HR.

## 2013-10-07 NOTE — Progress Notes (Signed)
Pre visit review using our clinic review tool, if applicable. No additional management support is needed unless otherwise documented below in the visit note. 

## 2013-10-07 NOTE — Assessment & Plan Note (Signed)
The current medical regimen is effective;  continue present plan and medications. I printed rx's for oxycodone and opana ER today for this month and the next.  Appropriate fill on/after date was noted on each rx. Controlled substance contract on record, UDS with appropriate results in recent past.

## 2013-10-26 ENCOUNTER — Other Ambulatory Visit: Payer: Self-pay | Admitting: Family Medicine

## 2013-10-26 DIAGNOSIS — D509 Iron deficiency anemia, unspecified: Secondary | ICD-10-CM

## 2013-10-26 DIAGNOSIS — R110 Nausea: Secondary | ICD-10-CM

## 2013-10-26 DIAGNOSIS — M797 Fibromyalgia: Secondary | ICD-10-CM

## 2013-10-26 DIAGNOSIS — E042 Nontoxic multinodular goiter: Secondary | ICD-10-CM

## 2013-10-26 MED ORDER — MONTELUKAST SODIUM 10 MG PO TABS: 10.0000 mg | ORAL_TABLET | Freq: Every day | ORAL | Status: DC

## 2013-10-27 ENCOUNTER — Other Ambulatory Visit: Payer: Self-pay | Admitting: Family Medicine

## 2013-10-28 NOTE — Telephone Encounter (Signed)
Last Rx 04/20/13 #180 with 1 refill, Last OV 10/07/13

## 2013-12-07 ENCOUNTER — Other Ambulatory Visit: Payer: Self-pay | Admitting: Family Medicine

## 2013-12-07 ENCOUNTER — Encounter (INDEPENDENT_AMBULATORY_CARE_PROVIDER_SITE_OTHER): Payer: Medicare Other | Admitting: Family Medicine

## 2013-12-07 DIAGNOSIS — IMO0001 Reserved for inherently not codable concepts without codable children: Secondary | ICD-10-CM

## 2013-12-07 DIAGNOSIS — M797 Fibromyalgia: Secondary | ICD-10-CM

## 2013-12-07 DIAGNOSIS — M2550 Pain in unspecified joint: Secondary | ICD-10-CM

## 2013-12-07 DIAGNOSIS — R5381 Other malaise: Secondary | ICD-10-CM

## 2013-12-07 DIAGNOSIS — R5383 Other fatigue: Secondary | ICD-10-CM

## 2013-12-07 DIAGNOSIS — E042 Nontoxic multinodular goiter: Secondary | ICD-10-CM

## 2013-12-07 DIAGNOSIS — A689 Relapsing fever, unspecified: Secondary | ICD-10-CM

## 2013-12-07 MED ORDER — OXYCODONE HCL 5 MG PO TABS: 5.0000 mg | ORAL_TABLET | ORAL | Status: DC | PRN

## 2013-12-07 MED ORDER — OXYMORPHONE HCL ER 30 MG PO T12A: EXTENDED_RELEASE_TABLET | ORAL | Status: DC

## 2013-12-07 NOTE — Progress Notes (Signed)
Pre visit review using our clinic review tool, if applicable. No additional management support is needed unless otherwise documented below in the visit note. 

## 2013-12-17 ENCOUNTER — Other Ambulatory Visit: Payer: Self-pay | Admitting: Family Medicine

## 2013-12-17 NOTE — Telephone Encounter (Signed)
Patient requesting rf of ambien.  Patient last apopintment was 12/07/13 but we were behind so you gave her scripts but she forgot to ask you about this one.  Patient last Rx was printed on 09/17/13 # 90.  Please advise.

## 2013-12-18 MED ORDER — ZOLPIDEM TARTRATE 10 MG PO TABS: ORAL_TABLET | ORAL | Status: DC

## 2013-12-18 NOTE — Addendum Note (Signed)
Addended by: Eulah PontALBRIGHT, Khameron Gruenwald M on: 12/18/2013 08:06 AM   Modules accepted: Orders

## 2013-12-18 NOTE — Telephone Encounter (Signed)
Reprinted Rx because Dr was not in today and wasn't able to sign Rx.  Maximino SarinLayne Weaver agreed to sign if Rx was reprinted under her name.  Rx Printed and faxed to pharmacy.

## 2014-01-19 ENCOUNTER — Other Ambulatory Visit: Payer: Self-pay | Admitting: Family Medicine

## 2014-01-19 NOTE — Telephone Encounter (Signed)
Pt requesting RF of phenergan.  Last OV was 10/07/13.  Next OV is 02/03/14.  Last RX was 07/03/13 #180 x 1 rf.  Please advise rf.

## 2014-01-29 ENCOUNTER — Ambulatory Visit (INDEPENDENT_AMBULATORY_CARE_PROVIDER_SITE_OTHER): Payer: Medicare Other | Admitting: Family Medicine

## 2014-01-29 ENCOUNTER — Encounter: Payer: Self-pay | Admitting: Family Medicine

## 2014-01-29 VITALS — BP 129/91 | HR 120 | Temp 98.6°F | Resp 18 | Ht 64.5 in | Wt 213.0 lb

## 2014-01-29 DIAGNOSIS — R300 Dysuria: Secondary | ICD-10-CM

## 2014-01-29 DIAGNOSIS — R1013 Epigastric pain: Secondary | ICD-10-CM

## 2014-01-29 LAB — COMPREHENSIVE METABOLIC PANEL
ALBUMIN: 4.2 g/dL (ref 3.5–5.2)
ALT: 24 U/L (ref 0–35)
AST: 29 U/L (ref 0–37)
Alkaline Phosphatase: 67 U/L (ref 39–117)
BUN: 4 mg/dL — AB (ref 6–23)
CHLORIDE: 95 meq/L — AB (ref 96–112)
CO2: 28 mEq/L (ref 19–32)
CREATININE: 0.7 mg/dL (ref 0.4–1.2)
Calcium: 9.5 mg/dL (ref 8.4–10.5)
GFR: 92.93 mL/min (ref >60.00)
Glucose, Bld: 98 mg/dL (ref 70–99)
Potassium: 4.3 mEq/L (ref 3.5–5.1)
Sodium: 132 mEq/L — ABNORMAL LOW (ref 135–145)
TOTAL PROTEIN: 7.7 g/dL (ref 6.0–8.3)
Total Bilirubin: 0.3 mg/dL (ref 0.2–1.2)

## 2014-01-29 LAB — CBC WITH DIFFERENTIAL/PLATELET
BASOS PCT: 0.4 % (ref 0.0–3.0)
Basophils Absolute: 0 10*3/uL (ref 0.0–0.1)
Eosinophils Absolute: 0.1 10*3/uL (ref 0.0–0.7)
Eosinophils Relative: 1.4 % (ref 0.0–5.0)
HEMATOCRIT: 35 % — AB (ref 36.0–46.0)
HEMOGLOBIN: 11.5 g/dL — AB (ref 12.0–15.0)
Lymphocytes Relative: 17.1 % (ref 12.0–46.0)
Lymphs Abs: 1.7 10*3/uL (ref 0.7–4.0)
MCHC: 32.7 g/dL (ref 30.0–36.0)
MCV: 82.5 fl (ref 78.0–100.0)
Monocytes Absolute: 0.5 10*3/uL (ref 0.1–1.0)
Monocytes Relative: 5 % (ref 3.0–12.0)
NEUTROS ABS: 7.8 10*3/uL — AB (ref 1.4–7.7)
Neutrophils Relative %: 76.1 % (ref 43.0–77.0)
Platelets: 308 10*3/uL (ref 150.0–400.0)
RBC: 4.25 Mil/uL (ref 3.87–5.11)
RDW: 16.9 % — ABNORMAL HIGH (ref 11.5–15.5)
WBC: 10.2 10*3/uL (ref 4.0–10.5)

## 2014-01-29 LAB — POCT URINALYSIS DIPSTICK
BILIRUBIN UA: NEGATIVE
GLUCOSE UA: NEGATIVE
KETONES UA: NEGATIVE
Nitrite, UA: POSITIVE
Protein, UA: NEGATIVE
RBC UA: NEGATIVE
SPEC GRAV UA: 1.01
Urobilinogen, UA: 0.2
pH, UA: 7

## 2014-01-29 LAB — LIPASE: LIPASE: 21 U/L (ref 11.0–59.0)

## 2014-01-29 MED ORDER — SULFAMETHOXAZOLE-TMP DS 800-160 MG PO TABS: 1.0000 | ORAL_TABLET | Freq: Two times a day (BID) | ORAL | Status: DC

## 2014-01-29 MED ORDER — PANTOPRAZOLE SODIUM 40 MG PO TBEC: 40.0000 mg | DELAYED_RELEASE_TABLET | Freq: Every day | ORAL | Status: DC

## 2014-01-29 MED ORDER — FLUCONAZOLE 150 MG PO TABS: ORAL_TABLET | ORAL | Status: DC

## 2014-01-29 MED ORDER — MORPHINE SULFATE ER 15 MG PO TBCR: 15.0000 mg | EXTENDED_RELEASE_TABLET | Freq: Two times a day (BID) | ORAL | Status: DC

## 2014-01-29 NOTE — Progress Notes (Signed)
Pre visit review using our clinic review tool, if applicable. No additional management support is needed unless otherwise documented below in the visit note. 

## 2014-01-29 NOTE — Progress Notes (Addendum)
OFFICE NOTE  01/29/2014  CC:  Chief Complaint  Patient presents with  . Dysuria    last night started getting really bad  . Abdominal Pain     HPI: Patient is a 48 y.o. Caucasian female who is here for continual burning in urethral area for 1 d, but off and on for last couple weeks.  Urinating does not make the burning worse.  Lower abd discomfort, lots of worse nausea lately, some morning vomiting.  No gross hematuria or flank pains. Some severe epigastric region pain onset yesterday, lessened in intensity some today down to 6/10. Took levsin, imodium, ranitidine and this all helped a little.  No NSAIDs.  Took AZO yesterday. No fevers.  Pertinent PMH:  Past medical, surgical, social, and family history reviewed and no changes are noted since last office visit.  MEDS:  Outpatient Prescriptions Prior to Visit  Medication Sig Dispense Refill  . albuterol (PROAIR HFA) 108 (90 BASE) MCG/ACT inhaler Inhale 2 puffs into the lungs every 4 (four) hours as needed. Shortness of breath  1 Inhaler  1  . amLODipine (NORVASC) 5 MG tablet Take 1 tablet (5 mg total) by mouth daily.  90 tablet  1  . Calcium Carbonate (CALCIUM 500 PO) Take 1 tablet by mouth daily.       . clonazePAM (KLONOPIN) 1 MG tablet TAKE 1 TABLET BY MOUTH TWICE A DAY AS NEEDED  180 tablet  1  . cyclobenzaprine (FLEXERIL) 10 MG tablet Take 1 tablet (10 mg total) by mouth 3 (three) times daily as needed for muscle spasms.  270 tablet  1  . Fish Oil OIL Take 2 capsules by mouth daily.       . Fluticasone-Salmeterol (ADVAIR) 500-50 MCG/DOSE AEPB Inhale 1 puff into the lungs 2 (two) times daily.  60 each  6  . hyoscyamine (LEVSIN SL) 0.125 MG SL tablet Take 1-2 tablets (0.125-0.25 mg total) by mouth every 6 (six) hours as needed for cramping.  270 tablet  1  . MAGNESIUM PO Take 1 tablet by mouth daily. Unknown strength      . montelukast (SINGULAIR) 10 MG tablet Take 1 tablet (10 mg total) by mouth at bedtime.  90 tablet  1  .  ondansetron (ZOFRAN-ODT) 8 MG disintegrating tablet 1 tab po tid prn nausea  270 tablet  1  . oxyCODONE (OXY IR/ROXICODONE) 5 MG immediate release tablet Take 1-2 tablets (5-10 mg total) by mouth every 4 (four) hours as needed.  300 tablet  0  . Oxymorphone HCl, Crush Resist, (OPANA ER, CRUSH RESISTANT,) 30 MG T12A 1 tab po q12h for chronic pain  60 tablet  0  . phenazopyridine (PYRIDIUM) 200 MG tablet Take 1 tablet (200 mg total) by mouth 3 (three) times daily as needed for pain.  90 tablet  2  . polyethylene glycol powder (GLYCOLAX/MIRALAX) powder DISSOLVE 1 CAPFUL INTO LIQUID AND TAKE BY MOUTH ONE TO THREE TIMES DAILY AS NEEDED FOR CONSTIPATION  1581 g  3  . pramipexole (MIRAPEX) 0.125 MG tablet Take 3 tabs po qhs  270 tablet  1  . promethazine (PHENERGAN) 25 MG tablet TAKE 1 TABLET BY MOUTH EVERY 6 HOURS AS NEEDED FOR NAUSEA  180 tablet  1  . ranitidine (ZANTAC) 150 MG tablet Take 1 tablet (150 mg total) by mouth 2 (two) times daily.  180 tablet  4  . sucralfate (CARAFATE) 1 G tablet 1 tab about 1 hour prior to each meal prn      .  SUMAtriptan (IMITREX) 50 MG tablet Take 2 tablets (100 mg total) by mouth every 2 (two) hours as needed (migraine HA (max of 200mg /24h).  27 tablet  1  . zolpidem (AMBIEN) 10 MG tablet TAKE 1 TABLET BY MOUTH AT BEDTIME  90 tablet  0   No facility-administered medications prior to visit.    PE: Blood pressure 129/91, pulse 120, temperature 98.6 F (37 C), temperature source Temporal, resp. rate 18, height 5' 4.5" (1.638 m), weight 213 lb (96.616 kg), SpO2 99.00%. Gen: Alert, well appearing.  Patient is oriented to person, place, time, and situation. WUJ:WJXBENT:Eyes: no injection, icteris, swelling, or exudate.  EOMI, PERRLA. Mouth: lips without lesion/swelling.  Oral mucosa pink and moist. Oropharynx without erythema, exudate, or swelling.  Neck - No masses or thyromegaly or limitation in range of motion CV: RRR, no m/r/g.   LUNGS: CTA bilat, nonlabored resps, good  aeration in all lung fields. ABD: soft, nondistended, moderate TTP in mid epigastric area and LUQ, mild in RUQ, mild on both lower quads.  No rebound tenderness.   No bruit or HSM. EXT: no clubbing, cyanosis, or edema.    LAB: CC UA today showed + nitrite, small LEU, otherwise normal.  IMPRESSION AND PLAN:  1) Epigastric pain: likely gastritis, but will check CBC, CMET, lipase. Start pantoprazole 40mg  qd.  2) Dysuria: she has atypical cystitis sx's, likely more suggestive of her interstitial cystitis.  Question of UA truly abnl today vs AZO effect.  Send urine for c/s, start bactrim DS 1 bid x 5d.  Flucon 150 qd x 3d as per pt request for frequent yeast vaginitis with abx use.  3) Chronic pain syndrom: change opana to MS contin 15mg  bid, #60, no RF--since she is in donut hole. An After Visit Summary was printed and given to the patient.  FOLLOW UP: has appt 02/03/14

## 2014-01-29 NOTE — Addendum Note (Signed)
Addended by: Jeoffrey MassedMCGOWEN, PHILIP H on: 01/29/2014 01:44 PM   Modules accepted: Orders, Medications

## 2014-01-31 LAB — URINE CULTURE: Colony Count: 30000

## 2014-02-03 ENCOUNTER — Ambulatory Visit: Payer: Medicare Other | Admitting: Family Medicine

## 2014-02-04 ENCOUNTER — Encounter: Payer: Self-pay | Admitting: Family Medicine

## 2014-02-04 ENCOUNTER — Ambulatory Visit (INDEPENDENT_AMBULATORY_CARE_PROVIDER_SITE_OTHER): Payer: Medicare Other | Admitting: Family Medicine

## 2014-02-04 VITALS — BP 117/80 | HR 127 | Temp 98.0°F | Resp 20 | Ht 64.5 in | Wt 215.0 lb

## 2014-02-04 DIAGNOSIS — E042 Nontoxic multinodular goiter: Secondary | ICD-10-CM

## 2014-02-04 DIAGNOSIS — M797 Fibromyalgia: Secondary | ICD-10-CM

## 2014-02-04 DIAGNOSIS — R5381 Other malaise: Secondary | ICD-10-CM

## 2014-02-04 DIAGNOSIS — M2550 Pain in unspecified joint: Secondary | ICD-10-CM

## 2014-02-04 DIAGNOSIS — A689 Relapsing fever, unspecified: Secondary | ICD-10-CM

## 2014-02-04 DIAGNOSIS — N3090 Cystitis, unspecified without hematuria: Secondary | ICD-10-CM

## 2014-02-04 DIAGNOSIS — R5383 Other fatigue: Secondary | ICD-10-CM

## 2014-02-04 DIAGNOSIS — IMO0001 Reserved for inherently not codable concepts without codable children: Secondary | ICD-10-CM

## 2014-02-04 MED ORDER — MORPHINE SULFATE ER 30 MG PO TBCR: 30.0000 mg | EXTENDED_RELEASE_TABLET | Freq: Two times a day (BID) | ORAL | Status: DC

## 2014-02-04 MED ORDER — OXYCODONE HCL 5 MG PO TABS: 5.0000 mg | ORAL_TABLET | ORAL | Status: DC | PRN

## 2014-02-04 NOTE — Progress Notes (Signed)
OFFICE NOTE  02/04/2014  CC:  Chief Complaint  Patient presents with  . Follow-up     HPI: Patient is a 48 y.o. Caucasian female who is here for routine f/u chronic pain due to fibromyalgia, also f/u recurrent dysuria--recently treated for UTI (clx grew 20K col/ml Group B strep, pt took course of bactrim). Due to being in donut-hole, we changed her to MS contin as her long acting opioid on 01/29/14.   Felt like urinary sx's got better some but have returned : urgency, dysuria, frequency, lower abd discomfort constant. Taking AZO regularly still.     No constipation.    Pain control since switch to MS contin 15mg  bid is "OK", but she thinks she will need 30 mg bid. Usually uses 6-8 oxycodone tabs per day for breakthrough pain.  Pertinent PMH:  Past medical, surgical, social, and family history reviewed and no changes are noted since last office visit.  MEDS:  Outpatient Prescriptions Prior to Visit  Medication Sig Dispense Refill  . albuterol (PROAIR HFA) 108 (90 BASE) MCG/ACT inhaler Inhale 2 puffs into the lungs every 4 (four) hours as needed. Shortness of breath  1 Inhaler  1  . amLODipine (NORVASC) 5 MG tablet Take 1 tablet (5 mg total) by mouth daily.  90 tablet  1  . Calcium Carbonate (CALCIUM 500 PO) Take 1 tablet by mouth daily.       . clonazePAM (KLONOPIN) 1 MG tablet TAKE 1 TABLET BY MOUTH TWICE A DAY AS NEEDED  180 tablet  1  . cyclobenzaprine (FLEXERIL) 10 MG tablet Take 1 tablet (10 mg total) by mouth 3 (three) times daily as needed for muscle spasms.  270 tablet  1  . Fish Oil OIL Take 2 capsules by mouth daily.       . fluconazole (DIFLUCAN) 150 MG tablet 1 tab po qd x 3d  3 tablet  1  . Fluticasone-Salmeterol (ADVAIR) 500-50 MCG/DOSE AEPB Inhale 1 puff into the lungs 2 (two) times daily.  60 each  6  . hyoscyamine (LEVSIN SL) 0.125 MG SL tablet Take 1-2 tablets (0.125-0.25 mg total) by mouth every 6 (six) hours as needed for cramping.  270 tablet  1  . MAGNESIUM PO  Take 1 tablet by mouth daily. Unknown strength      . montelukast (SINGULAIR) 10 MG tablet Take 1 tablet (10 mg total) by mouth at bedtime.  90 tablet  1  . morphine (MS CONTIN) 15 MG 12 hr tablet Take 1 tablet (15 mg total) by mouth every 12 (twelve) hours.  60 tablet  0  . ondansetron (ZOFRAN-ODT) 8 MG disintegrating tablet 1 tab po tid prn nausea  270 tablet  1  . oxyCODONE (OXY IR/ROXICODONE) 5 MG immediate release tablet Take 1-2 tablets (5-10 mg total) by mouth every 4 (four) hours as needed.  300 tablet  0  . pantoprazole (PROTONIX) 40 MG tablet Take 1 tablet (40 mg total) by mouth daily.  30 tablet  1  . phenazopyridine (PYRIDIUM) 200 MG tablet Take 1 tablet (200 mg total) by mouth 3 (three) times daily as needed for pain.  90 tablet  2  . polyethylene glycol powder (GLYCOLAX/MIRALAX) powder DISSOLVE 1 CAPFUL INTO LIQUID AND TAKE BY MOUTH ONE TO THREE TIMES DAILY AS NEEDED FOR CONSTIPATION  1581 g  3  . pramipexole (MIRAPEX) 0.125 MG tablet Take 3 tabs po qhs  270 tablet  1  . promethazine (PHENERGAN) 25 MG tablet TAKE 1 TABLET BY  MOUTH EVERY 6 HOURS AS NEEDED FOR NAUSEA  180 tablet  1  . ranitidine (ZANTAC) 150 MG tablet Take 1 tablet (150 mg total) by mouth 2 (two) times daily.  180 tablet  4  . sucralfate (CARAFATE) 1 G tablet 1 tab about 1 hour prior to each meal prn      . SUMAtriptan (IMITREX) 50 MG tablet Take 2 tablets (100 mg total) by mouth every 2 (two) hours as needed (migraine HA (max of 200mg /24h).  27 tablet  1  . zolpidem (AMBIEN) 10 MG tablet TAKE 1 TABLET BY MOUTH AT BEDTIME  90 tablet  0  . sulfamethoxazole-trimethoprim (BACTRIM DS) 800-160 MG per tablet Take 1 tablet by mouth 2 (two) times daily.  10 tablet  0   No facility-administered medications prior to visit.    PE: Blood pressure 117/80, pulse 127, temperature 98 F (36.7 C), temperature source Temporal, resp. rate 20, height 5' 4.5" (1.638 m), weight 215 lb (97.523 kg), SpO2 96.00%. Gen: Alert, well appearing.   Patient is oriented to person, place, time, and situation. AFFECT: pleasant, lucid thought and speech. No further exam today.  LAB: none today  IMPRESSION AND PLAN:  1) Severe fibromyalgia/chronic pain syndrome:  Increase MS contin to 30mg  bid (she'll finish up her 15mg  tabs by taking 2 bid, then fill rx for 30mg  MS Contin, #60, no RF that I handed to her today.  Also gave oxycodone 5mg , #300, for this month and another to be filled in 1 mo.  Appropriate fill on/after date on rx's.  2) Cystitis: suspect interstitial cystitis. Send urine clx today (she is taking AZO, so her UA will be impossible to interpret). I recommended pt make appt with her urologist to f/u her interstitial cystitis.  An After Visit Summary was printed and given to the patient.  FOLLOW UP: 5-6 wks

## 2014-02-04 NOTE — Progress Notes (Signed)
Pre visit review using our clinic review tool, if applicable. No additional management support is needed unless otherwise documented below in the visit note. 

## 2014-02-06 LAB — URINE CULTURE

## 2014-02-08 ENCOUNTER — Ambulatory Visit: Payer: Medicare Other | Admitting: Family Medicine

## 2014-02-15 ENCOUNTER — Other Ambulatory Visit: Payer: Self-pay | Admitting: Family Medicine

## 2014-02-17 ENCOUNTER — Other Ambulatory Visit: Payer: Self-pay | Admitting: Family Medicine

## 2014-03-02 ENCOUNTER — Other Ambulatory Visit: Payer: Self-pay | Admitting: Family Medicine

## 2014-03-12 ENCOUNTER — Ambulatory Visit: Payer: Medicare Other | Admitting: Family Medicine

## 2014-03-16 ENCOUNTER — Encounter: Payer: Self-pay | Admitting: Family Medicine

## 2014-03-16 ENCOUNTER — Ambulatory Visit (INDEPENDENT_AMBULATORY_CARE_PROVIDER_SITE_OTHER): Payer: Medicare Other | Admitting: Family Medicine

## 2014-03-16 VITALS — BP 121/86 | HR 116 | Temp 99.0°F | Resp 20 | Ht 64.5 in | Wt 217.0 lb

## 2014-03-16 DIAGNOSIS — R300 Dysuria: Secondary | ICD-10-CM

## 2014-03-16 LAB — POCT URINALYSIS DIPSTICK
BILIRUBIN UA: NEGATIVE
Glucose, UA: NEGATIVE
Ketones, UA: NEGATIVE
LEUKOCYTES UA: NEGATIVE
NITRITE UA: NEGATIVE
PH UA: 6
Protein, UA: NEGATIVE
RBC UA: NEGATIVE
Spec Grav, UA: <=1.005
UROBILINOGEN UA: 0.2

## 2014-03-16 MED ORDER — MORPHINE SULFATE ER 15 MG PO TBCR: EXTENDED_RELEASE_TABLET | ORAL | Status: DC

## 2014-03-16 MED ORDER — MORPHINE SULFATE ER 30 MG PO TBCR: EXTENDED_RELEASE_TABLET | ORAL | Status: DC

## 2014-03-16 MED ORDER — HYDROCODONE-ACETAMINOPHEN 10-325 MG PO TABS: ORAL_TABLET | ORAL | Status: DC

## 2014-03-16 NOTE — Progress Notes (Signed)
Pre visit review using our clinic review tool, if applicable. No additional management support is needed unless otherwise documented below in the visit note. 

## 2014-03-16 NOTE — Progress Notes (Signed)
OFFICE NOTE  03/16/2014  CC:  Chief Complaint  Patient presents with  . Follow-up  . Dysuria   HPI: Patient is a 48 y.o. Caucasian female who is here for f/u chronic pain syndrome/severe fibromyalgia. Says she has not had a good month.  Pain control hard to discern, has had to be more active lately with cleaning her dying father's home and helping her daughter move out of their home.  Wants to increase morphine and asks to change oxycodone as breakthrough med to vicodin 10/325. Says this is based on her sister recently letting her take 2 of her vicodin for severe breakthrough pain and she had remarkable improvement/relief from her pain.  Denies any change in bowel habits since being on MS contin compared to oxycontin.  Urine sx's recently bothering her "off and on".  Today it is not bothering her but she wanted [place] to check a UA anywary. She feels like it is has been her IC acting up, and I agree since none of her urine cultures have grown a significant amount of one organism. She has not followed up with her urologist for her IC in quite some time, but plans on trying to do this--finanacial issues lately. Has to choose between f/u with urol vs f/u with rheum due to money issues. She says she most recently took her morphine last night, oxycodone this morning, clonazepam last night, flexeril this morning, ambien last night.  Pertinent PMH:  Past medical, surgical, social, and family history reviewed and no changes are noted since last office visit.  MEDS:  Outpatient Prescriptions Prior to Visit  Medication Sig Dispense Refill  . albuterol (PROAIR HFA) 108 (90 BASE) MCG/ACT inhaler Inhale 2 puffs into the lungs every 4 (four) hours as needed. Shortness of breath  1 Inhaler  1  . amLODipine (NORVASC) 5 MG tablet Take 1 tablet (5 mg total) by mouth daily.  90 tablet  1  . Calcium Carbonate (CALCIUM 500 PO) Take 1 tablet by mouth daily.       . clonazePAM (KLONOPIN) 1 MG tablet TAKE 1  TABLET BY MOUTH TWICE A DAY AS NEEDED  180 tablet  1  . cyclobenzaprine (FLEXERIL) 10 MG tablet Take 1 tablet (10 mg total) by mouth 3 (three) times daily as needed for muscle spasms.  270 tablet  1  . Fish Oil OIL Take 2 capsules by mouth daily.       . fluconazole (DIFLUCAN) 150 MG tablet 1 tab po qd x 3d  3 tablet  1  . Fluticasone-Salmeterol (ADVAIR) 500-50 MCG/DOSE AEPB Inhale 1 puff into the lungs 2 (two) times daily.  60 each  6  . hyoscyamine (LEVSIN SL) 0.125 MG SL tablet Take 1-2 tablets (0.125-0.25 mg total) by mouth every 6 (six) hours as needed for cramping.  270 tablet  1  . MAGNESIUM PO Take 1 tablet by mouth daily. Unknown strength      . montelukast (SINGULAIR) 10 MG tablet Take 1 tablet (10 mg total) by mouth at bedtime.  90 tablet  1  . montelukast (SINGULAIR) 10 MG tablet TAKE 1 TABLET BY MOUTH AT BEDTIME  30 tablet  6  . morphine (MS CONTIN) 30 MG 12 hr tablet Take 1 tablet (30 mg total) by mouth every 12 (twelve) hours.  60 tablet  0  . ondansetron (ZOFRAN-ODT) 8 MG disintegrating tablet 1 tab po tid prn nausea  270 tablet  1  . oxyCODONE (OXY IR/ROXICODONE) 5 MG immediate release tablet  Take 1-2 tablets (5-10 mg total) by mouth every 4 (four) hours as needed.  300 tablet  0  . pantoprazole (PROTONIX) 40 MG tablet Take 1 tablet (40 mg total) by mouth daily.  30 tablet  1  . phenazopyridine (PYRIDIUM) 200 MG tablet Take 1 tablet (200 mg total) by mouth 3 (three) times daily as needed for pain.  90 tablet  2  . polyethylene glycol powder (GLYCOLAX/MIRALAX) powder DISSOLVE 1 CAPFUL INTO LIQUID AND TAKE BY MOUTH ONE TO THREE TIMES DAILY AS NEEDED FOR CONSTIPATION  1581 g  3  . pramipexole (MIRAPEX) 0.125 MG tablet TAKE 3 TABLETS BY MOUTH AT BEDTIME  270 tablet  1  . pramipexole (MIRAPEX) 0.125 MG tablet TAKE 3 TABLETS BY MOUTH AT BEDTIME  270 tablet  4  . promethazine (PHENERGAN) 25 MG tablet TAKE 1 TABLET BY MOUTH EVERY 6 HOURS AS NEEDED FOR NAUSEA  180 tablet  1  . ranitidine  (ZANTAC) 150 MG tablet Take 1 tablet (150 mg total) by mouth 2 (two) times daily.  180 tablet  4  . sucralfate (CARAFATE) 1 G tablet 1 tab about 1 hour prior to each meal prn      . SUMAtriptan (IMITREX) 50 MG tablet Take 2 tablets (100 mg total) by mouth every 2 (two) hours as needed (migraine HA (max of 200mg /24h).  27 tablet  1  . zolpidem (AMBIEN) 10 MG tablet TAKE 1 TABLET BY MOUTH AT BEDTIME  90 tablet  0   No facility-administered medications prior to visit.    PE: Blood pressure 121/86, pulse 116, temperature 99 F (37.2 C), temperature source Temporal, resp. rate 20, height 5' 4.5" (1.638 m), weight 217 lb (98.431 kg), SpO2 99.00%. Gen: Alert, well appearing.  Patient is oriented to person, place, time, and situation. Affect: slightly nervous, a bit tearful when talking about her dad being sick. ONG:EXBM: no injection, icteris, swelling, or exudate.  EOMI, PERRLA. Mouth: lips without lesion/swelling.  Oral mucosa pink and moist. Oropharynx without erythema, exudate, or swelling.  Neck - No masses or thyromegaly or limitation in range of motion CV: Regular, tachy to 115-120s.  No murmur or rub. Chest is clear, no wheezing or rales. Normal symmetric air entry throughout both lung fields. No chest wall deformities or tenderness. MUSCULO: she is tender on every soft tissue point tested on her back, arms, legs, hips. No joint erythema, warmth, or swelling.  LAB: CC UA today was normal  IMPRESSION AND PLAN:  1) Chronic pain, narcotic med mgmt--severe fibromyalgia. Will go ahead and increase MS contin to 45mg  bid dosing.  She has a 30mg  rx due to be filled in the next few days.  I gave an additional 30mg  MS contin rx today with appropriate fill on/after date on it.  I gave MS contin 15mg , 1 bid (to be added to each daily dose of her 30mg  MS contin) , #60.  I d/c'd her oxycodone and wrote rx for vicodin 5/325, 1-2 q6h prn breakthrough pain, max of 6 tabs in 24h period, #180. Patient has  controlled substances contract in chart.   UDS obtained today.  2) Anxiety/depression: she is dealing with some tough life issues but no med changes indicated at this time. No RF of anxiety med today, but next RF request we get pt says she would prefer 90 d supply (clonazepam) if possible, and I said this was fine.  3) Interstitial cystitis: sx's waxing and waning.  I told the patient that I don't  know if her frequent use of pyridium could lead to HYPERsensitization/worse sx's over time.  I recommended she f/u with her urologist and pose this question to him.  4) Tachycardia: stable.  Testing in the past has not revealed a cause.  I wonder if the salmeterol in her advair may be causing this.    Will recheck in office to see how pain control is going in 1 mo.  FOLLOW UP: 1 mo

## 2014-03-22 ENCOUNTER — Telehealth: Payer: Self-pay | Admitting: Family Medicine

## 2014-03-22 MED ORDER — ZOLPIDEM TARTRATE 10 MG PO TABS: ORAL_TABLET | ORAL | Status: DC

## 2014-03-22 NOTE — Telephone Encounter (Signed)
Called patient again today (also called last week and left pt VM to call our office back) and notified her that her recent urine tox screen did NOT show morphine. She repeated that she had taken it the day prior to the tox screen but didn't take it the day of the screen.  I recommended she come by again this week to give urine sample to repeat the tox screen: this one should have morphine and hydrocodone, plus ambien and clonazepam.  It may also have oxycodone in it, as I only recently switched her over from oxycodone to vicodin for breakthrough pain.  As per the [place] Controlled Substance Reporting System web site, pt has been filling rx's for MS contin rx'd by myself.    This is the first UDS that the patient has failed.  I am willing to continue rx'ing meds with close f/u IF her next UDS comes back with appropriate results. If further failed UDS's, then I'll no longer prescribe pt controlled substances.

## 2014-03-22 NOTE — Telephone Encounter (Signed)
Ambien rx printed

## 2014-03-22 NOTE — Telephone Encounter (Signed)
Rx faxed with confirmation.

## 2014-03-22 NOTE — Telephone Encounter (Signed)
Refill request for ambien.  Patient last seen 8/11/5.  Last Rx was printed 12/18/13.  Please advise.

## 2014-04-02 ENCOUNTER — Other Ambulatory Visit: Payer: Self-pay | Admitting: Family Medicine

## 2014-04-06 ENCOUNTER — Encounter: Payer: Self-pay | Admitting: Family Medicine

## 2014-04-13 ENCOUNTER — Telehealth: Payer: Self-pay

## 2014-04-13 MED ORDER — CYCLOBENZAPRINE HCL 10 MG PO TABS: 10.0000 mg | ORAL_TABLET | Freq: Three times a day (TID) | ORAL | Status: DC | PRN

## 2014-04-13 MED ORDER — HYDROCODONE-ACETAMINOPHEN 10-325 MG PO TABS: ORAL_TABLET | ORAL | Status: DC

## 2014-04-13 MED ORDER — MORPHINE SULFATE ER 15 MG PO TBCR: EXTENDED_RELEASE_TABLET | ORAL | Status: DC

## 2014-04-13 NOTE — Telephone Encounter (Signed)
Spoke with pt, advised message from Dr Milinda Cave. Pt understood. She still needs a refill on her flexeril, Morphine, and Hydrocodone. She would like to pick this up as soon as possible because she will be in [place] for a couple of days. Her last refill on Hydrocodone was 03/16/2014. Please advise.

## 2014-04-13 NOTE — Telephone Encounter (Signed)
Flexeril sent. Vicodin printed. Morphine  printed. She already has a Morphine  rx with instructions to fill on/after 04/18/14.

## 2014-04-13 NOTE — Telephone Encounter (Signed)
Pt called and wanted to know if she could wait a month until she see's Dr Milinda Cave again. We had to cancel her appt this week and she states her father dies. She would like a refill on Flexeril but she is still having trouble sleeping. She wantes to ask Dr Milinda Cave if she could take an Rx called Lizanae hcl? She states she tried one of her sister's and it helped her sleep very well during this process with her dad. Please advise

## 2014-04-13 NOTE — Telephone Encounter (Signed)
Yes, may put off visit for a month. I don't recommend any new meds (I could not find the med you listed in your note when I tried to look it up). Tell her my approach to this situation is that taking extra meds to sleep through the grieving process is TOO DANGEROUS, esp since she is already on plenty of sedating meds already.-thx

## 2014-04-14 NOTE — Telephone Encounter (Signed)
[facility] rx's are ready to be picked up.

## 2014-04-15 ENCOUNTER — Ambulatory Visit: Payer: Medicare Other | Admitting: Family Medicine

## 2014-04-27 ENCOUNTER — Encounter: Payer: Self-pay | Admitting: Family Medicine

## 2014-05-13 ENCOUNTER — Encounter: Payer: Self-pay | Admitting: Family Medicine

## 2014-05-13 ENCOUNTER — Ambulatory Visit (INDEPENDENT_AMBULATORY_CARE_PROVIDER_SITE_OTHER): Payer: Medicare Other | Admitting: Family Medicine

## 2014-05-13 ENCOUNTER — Ambulatory Visit: Payer: Medicare Other | Admitting: Family Medicine

## 2014-05-13 VITALS — BP 142/94 | HR 125 | Temp 98.7°F | Resp 18 | Ht 64.5 in | Wt 193.0 lb

## 2014-05-13 DIAGNOSIS — J4540 Moderate persistent asthma, uncomplicated: Secondary | ICD-10-CM

## 2014-05-13 DIAGNOSIS — Z23 Encounter for immunization: Secondary | ICD-10-CM

## 2014-05-13 DIAGNOSIS — F4321 Adjustment disorder with depressed mood: Secondary | ICD-10-CM

## 2014-05-13 DIAGNOSIS — M797 Fibromyalgia: Secondary | ICD-10-CM

## 2014-05-13 DIAGNOSIS — R5382 Chronic fatigue, unspecified: Secondary | ICD-10-CM

## 2014-05-13 MED ORDER — HYDROCODONE-ACETAMINOPHEN 10-325 MG PO TABS: ORAL_TABLET | ORAL | Status: DC

## 2014-05-13 MED ORDER — MORPHINE SULFATE ER 15 MG PO TBCR: EXTENDED_RELEASE_TABLET | ORAL | Status: DC

## 2014-05-13 MED ORDER — MORPHINE SULFATE ER 30 MG PO TBCR
EXTENDED_RELEASE_TABLET | ORAL | Status: DC
Start: 2014-05-13 — End: 2014-08-11

## 2014-05-13 MED ORDER — MORPHINE SULFATE ER 30 MG PO TBCR: EXTENDED_RELEASE_TABLET | ORAL | Status: DC

## 2014-05-13 MED ORDER — CLONAZEPAM 1 MG PO TABS: ORAL_TABLET | ORAL | Status: DC

## 2014-05-13 NOTE — Assessment & Plan Note (Signed)
Coping pretty well with recent death of her father. Encouraged pt. No new meds.

## 2014-05-13 NOTE — Assessment & Plan Note (Addendum)
Stable on current pain med regimen.  No complications from meds. Pain contract has been reviewed.  UDS last visit showed appropriate results. I printed rx's for MS Contin 30mg , 1 bid, MS contin 15mg  bid, Vicodin 10/325 1-2 q6prn max of 6/24h #180--each of these for this month, November, and December 2015.  Appropriate fill on/after date was noted on each rx.

## 2014-05-13 NOTE — Assessment & Plan Note (Signed)
Discouraged pt's reliance on albuterol inhaler as her only treatment. She'll check with her supplemental insurer to see if there is a controller med they'll cover and get back to me.

## 2014-05-13 NOTE — Progress Notes (Signed)
OFFICE NOTE  05/13/2014  CC:  Chief Complaint  Patient presents with  . Follow-up   HPI: Patient is a 48 y.o. Caucasian female who is here for 2 mo f/u chronic pain syndrome/severe fibromyalgia, chronic anxiety, asthma. Dieting lately, purposeful wt loss of 24 lbs since last visit.  She is happy with this. Dealing with grief due to the death of her father from cancer about a month ago.  He was in hospice for a week before dying and she recalls this as a sweet time of family togetherness. She is also dealing with marital issues again, says she is separated from husband again, will be living in [place], [place] but still will drive here to see me as her PCP.    Has been out of advair for 2 mo.  Cites cost of med as the cause.  Using albuterol anywhere from a couple times a day some days to just a couple times a week some weeks.  Has not used any in 2 days.     Pertinent PMH:  Past medical, surgical, social, and family history reviewed and no changes are noted since last office visit.  MEDS:  Outpatient Prescriptions Prior to Visit  Medication Sig Dispense Refill  . albuterol (PROAIR HFA) 108 (90 BASE) MCG/ACT inhaler Inhale 2 puffs into the lungs every 4 (four) hours as needed. Shortness of breath  1 Inhaler  1  . amLODipine (NORVASC) 5 MG tablet Take 1 tablet (5 mg total) by mouth daily.  90 tablet  1  . Calcium Carbonate (CALCIUM 500 PO) Take 1 tablet by mouth daily.       . clonazePAM (KLONOPIN) 1 MG tablet TAKE 1 TABLET BY MOUTH TWICE A DAY AS NEEDED  180 tablet  1  . cyclobenzaprine (FLEXERIL) 10 MG tablet Take 1 tablet (10 mg total) by mouth 3 (three) times daily as needed for muscle spasms.  270 tablet  1  . Fish Oil OIL Take 2 capsules by mouth daily.       . fluconazole (DIFLUCAN) 150 MG tablet 1 tab po qd x 3d  3 tablet  1  . Fluticasone-Salmeterol (ADVAIR) 500-50 MCG/DOSE AEPB Inhale 1 puff into the lungs 2 (two) times daily.  60 each  6  . HYDROcodone-acetaminophen (NORCO) 10-325 MG  per tablet 1-2 tabs po q6h prn breakthrough pain, max of 6 tabs in 24 hour period  180 tablet  0  . hyoscyamine (LEVSIN SL) 0.125 MG SL tablet Take 1-2 tablets (0.125-0.25 mg total) by mouth every 6 (six) hours as needed for cramping.  270 tablet  1  . MAGNESIUM PO Take 1 tablet by mouth daily. Unknown strength      . montelukast (SINGULAIR) 10 MG tablet Take 1 tablet (10 mg total) by mouth at bedtime.  90 tablet  1  . morphine (MS CONTIN) 15 MG 12 hr tablet 1 tab po q12h to be combined with a 30mg  MS contin at each dose  60 tablet  0  . morphine (MS CONTIN) 30 MG 12 hr tablet 1 tab po bid to be combined with a 15mg  MS contin tab at each dose  60 tablet  0  . ondansetron (ZOFRAN-ODT) 8 MG disintegrating tablet 1 tab po tid prn nausea  270 tablet  1  . pantoprazole (PROTONIX) 40 MG tablet TAKE 1 TABLET BY MOUTH EVERY DAY  30 tablet  1  . phenazopyridine (PYRIDIUM) 200 MG tablet Take 1 tablet (200 mg total) by mouth 3 (three)  times daily as needed for pain.  90 tablet  2  . polyethylene glycol powder (GLYCOLAX/MIRALAX) powder DISSOLVE 1 CAPFUL INTO LIQUID AND TAKE BY MOUTH ONE TO THREE TIMES DAILY AS NEEDED FOR CONSTIPATION  1581 g  3  . pramipexole (MIRAPEX) 0.125 MG tablet TAKE 3 TABLETS BY MOUTH AT BEDTIME  270 tablet  4  . promethazine (PHENERGAN) 25 MG tablet TAKE 1 TABLET BY MOUTH EVERY 6 HOURS AS NEEDED FOR NAUSEA  180 tablet  1  . ranitidine (ZANTAC) 150 MG tablet Take 1 tablet (150 mg total) by mouth 2 (two) times daily.  180 tablet  4  . sucralfate (CARAFATE) 1 G tablet 1 tab about 1 hour prior to each meal prn      . SUMAtriptan (IMITREX) 50 MG tablet Take 2 tablets (100 mg total) by mouth every 2 (two) hours as needed (migraine HA (max of 200mg /24h).  27 tablet  1  . zolpidem (AMBIEN) 10 MG tablet TAKE 1 TABLET BY MOUTH AT BEDTIME  90 tablet  1   No facility-administered medications prior to visit.    PE: Blood pressure 142/94, pulse 125, temperature 98.7 F (37.1 C), temperature  source Temporal, resp. rate 18, height 5' 4.5" (1.638 m), weight 193 lb (87.544 kg), SpO2 97.00%. Gen: Alert, well appearing.  Patient is oriented to person, place, time, and situation. CV: Regular, rate 115,  no m/r/g.   LUNGS: CTA bilat, nonlabored resps, good aeration in all lung fields.   IMPRESSION AND PLAN:  Chronic fatigue fibromyalgia syndrome Stable on current pain med regimen.  No complications from meds. Pain contract has been reviewed.  UDS last visit showed appropriate results. I printed rx's for MS Contin 30mg , 1 bid, MS contin 15mg  bid, Vicodin 10/325 1-2 q6prn max of 6/24h #180--each of these for this month, November, and December 2015.  Appropriate fill on/after date was noted on each rx.   Asthma, moderate persistent Discouraged pt's reliance on albuterol inhaler as her only treatment. She'll check with her supplemental insurer to see if there is a controller med they'll cover and get back to me.  Grieving Coping pretty well with recent death of her father. Encouraged pt. No new meds.  Flu vaccine IM today.  An After Visit Summary was printed and given to the patient.  Spent 25 min with pt today, with >50% of this time spent in counseling and care coordination regarding the above problems.  FOLLOW UP: 3 mo

## 2014-05-13 NOTE — Progress Notes (Signed)
Pre visit review using our clinic review tool, if applicable. No additional management support is needed unless otherwise documented below in the visit note. 

## 2014-05-20 ENCOUNTER — Ambulatory Visit: Payer: Medicare Other | Admitting: Family Medicine

## 2014-06-11 ENCOUNTER — Other Ambulatory Visit: Payer: Self-pay | Admitting: Family Medicine

## 2014-06-11 MED ORDER — PANTOPRAZOLE SODIUM 40 MG PO TBEC: DELAYED_RELEASE_TABLET | ORAL | Status: DC

## 2014-08-09 ENCOUNTER — Other Ambulatory Visit: Payer: Self-pay | Admitting: Family Medicine

## 2014-08-09 MED ORDER — MONTELUKAST SODIUM 10 MG PO TABS: 10.0000 mg | ORAL_TABLET | Freq: Every day | ORAL | Status: AC

## 2014-08-09 MED ORDER — PANTOPRAZOLE SODIUM 40 MG PO TBEC: DELAYED_RELEASE_TABLET | ORAL | Status: AC

## 2014-08-11 ENCOUNTER — Encounter: Payer: Self-pay | Admitting: Family Medicine

## 2014-08-11 ENCOUNTER — Ambulatory Visit (INDEPENDENT_AMBULATORY_CARE_PROVIDER_SITE_OTHER): Payer: BLUE CROSS/BLUE SHIELD | Admitting: Family Medicine

## 2014-08-11 VITALS — BP 123/84 | HR 114 | Temp 99.0°F | Resp 18 | Ht 64.5 in | Wt 224.0 lb

## 2014-08-11 DIAGNOSIS — J4540 Moderate persistent asthma, uncomplicated: Secondary | ICD-10-CM

## 2014-08-11 DIAGNOSIS — G2581 Restless legs syndrome: Secondary | ICD-10-CM

## 2014-08-11 DIAGNOSIS — G894 Chronic pain syndrome: Secondary | ICD-10-CM

## 2014-08-11 DIAGNOSIS — M797 Fibromyalgia: Secondary | ICD-10-CM

## 2014-08-11 MED ORDER — MORPHINE SULFATE ER 15 MG PO TBCR: EXTENDED_RELEASE_TABLET | ORAL | Status: DC

## 2014-08-11 MED ORDER — MORPHINE SULFATE ER 30 MG PO TBCR: EXTENDED_RELEASE_TABLET | ORAL | Status: DC

## 2014-08-11 MED ORDER — PRAMIPEXOLE DIHYDROCHLORIDE 0.125 MG PO TABS: ORAL_TABLET | ORAL | Status: AC

## 2014-08-11 MED ORDER — HYDROCODONE-ACETAMINOPHEN 10-325 MG PO TABS: ORAL_TABLET | ORAL | Status: DC

## 2014-08-11 MED ORDER — MORPHINE SULFATE ER 15 MG PO TBCR
EXTENDED_RELEASE_TABLET | ORAL | Status: DC
Start: 2014-08-11 — End: 2014-08-11

## 2014-08-11 MED ORDER — RANITIDINE HCL 150 MG PO TABS: 150.0000 mg | ORAL_TABLET | Freq: Two times a day (BID) | ORAL | Status: AC

## 2014-08-11 NOTE — Progress Notes (Signed)
Pre visit review using our clinic review tool, if applicable. No additional management support is needed unless otherwise documented below in the visit note. 

## 2014-08-11 NOTE — Progress Notes (Signed)
OFFICE NOTE  08/11/2014  CC:  Chief Complaint  Patient presents with  . Follow-up     HPI: Patient is a 49 y.o. Caucasian female who is here for 3 mo f/u chronic pain syndrome/severe fibromyalgia, chronic anxiety, persistent asthma.  Breathing: she feels like she is stable, rarely uses albuterol rescue, says she is compliant with controller meds.  Anxiety: she is having a tough time dealing with family relationships as she separates/divorces her husband.  She perseverates on this topic. She has repeatedly declined psychiatrist and counseling referral for this and other psych issues she has had.  She takes clonazepam  bid.  Fibromyalgia/chronic pain syndrome requiring chronic narcotic treatment to remain functional: she describes being in a cycle of worsened pain lately, hints at needing more/stronger meds as she has done in the past, even to the point of suggesting fentanyl patches as she has done in the past. However, I reminded her that I feel like narcotics are not helping her much b/c fibromyalgia pain is generally generated along a different pain pathway and I certainly was not going to increase her narcotics.  If anything, I would like her to get off these or decrease the dose over time.  However, at the mention of this she is quick to say that they actually do help her a lot and if she was to go off of them she would not be able to function/get out of bed, etc.  Denies joint swelling, fever, oral ulcers, rash.  She has been on lots of anti-emetics for at least 1-2 years now, unclear etiology of her nausea.  Some periods of recurrent abd pain but also long periods with no abd pain but simply nausea.  No vomiting. She has gone back and forth between phenergan and zofran whenever insurance/price dictates the choice of one over the other. She says she never uses both in the same day/time period. Gastric emptying study in 2013 was normal.  CT abd/pelv with contrast 12/2011 showed no  abd/pelv pathology to explain these sx's.    Says RLS worse lately, takes 3 mirapex qhs.    Of note, she is due for f/u of her multinodular thyroid condition: needs u/s arranged.  Pertinent PMH:  Past medical, surgical, social, and family history reviewed and no changes are noted since last office visit.  MEDS:  Outpatient Prescriptions Prior to Visit  Medication Sig Dispense Refill  . albuterol (PROAIR HFA) 108 (90 BASE) MCG/ACT inhaler Inhale 2 puffs into the lungs every 4 (four) hours as needed. Shortness of breath 1 Inhaler 1  . amLODipine (NORVASC) 5 MG tablet Take 1 tablet (5 mg total) by mouth daily. 90 tablet 1  . Calcium Carbonate (CALCIUM 500 PO) Take 1 tablet by mouth daily.     . clonazePAM (KLONOPIN) 1 MG tablet TAKE 1 TABLET BY MOUTH TWICE A DAY AS NEEDED 180 tablet 1  . cyclobenzaprine (FLEXERIL) 10 MG tablet Take 1 tablet (10 mg total) by mouth 3 (three) times daily as needed for muscle spasms. 270 tablet 1  . Fluticasone-Salmeterol (ADVAIR) 500-50 MCG/DOSE AEPB Inhale 1 puff into the lungs 2 (two) times daily. 60 each 6  . HYDROcodone-acetaminophen (NORCO) 10-325 MG per tablet 1-2 tabs po q6h prn breakthrough pain, max of 6 tabs in 24 hour period 180 tablet 0  . hyoscyamine (LEVSIN SL) 0.125 MG SL tablet Take 1-2 tablets (0.125-0.25 mg total) by mouth every 6 (six) hours as needed for cramping. 270 tablet 1  . MAGNESIUM PO  Take 1 tablet by mouth daily. Unknown strength    . montelukast (SINGULAIR) 10 MG tablet Take 1 tablet (10 mg total) by mouth at bedtime. 90 tablet 1  . morphine (MS CONTIN) 15 MG 12 hr tablet 1 tab po q12h to be combined with a  MS contin at each dose 60 tablet 0  . morphine (MS CONTIN) 30 MG 12 hr tablet 1 tab po bid to be combined with a  MS contin tab at each dose 60 tablet 0  . ondansetron (ZOFRAN-ODT) 8 MG disintegrating tablet 1 tab po tid prn nausea 270 tablet 1  . pantoprazole (PROTONIX) 40 MG tablet TAKE 1 TABLET BY MOUTH EVERY DAY 30  tablet 3  . phenazopyridine (PYRIDIUM) 200 MG tablet Take 1 tablet (200 mg total) by mouth 3 (three) times daily as needed for pain. 90 tablet 2  . polyethylene glycol powder (GLYCOLAX/MIRALAX) powder DISSOLVE 1 CAPFUL INTO LIQUID AND TAKE BY MOUTH ONE TO THREE TIMES DAILY AS NEEDED FOR CONSTIPATION 1581 g 3  . pramipexole (MIRAPEX) 0.125 MG tablet TAKE 3 TABLETS BY MOUTH AT BEDTIME 270 tablet 4  . promethazine (PHENERGAN) 25 MG tablet TAKE 1 TABLET BY MOUTH EVERY 6 HOURS AS NEEDED FOR NAUSEA 180 tablet 1  . ranitidine (ZANTAC) 150 MG tablet Take 1 tablet (150 mg total) by mouth 2 (two) times daily. 180 tablet 4  . SUMAtriptan (IMITREX) 50 MG tablet Take 2 tablets (100 mg total) by mouth every 2 (two) hours as needed (migraine HA (max of /24h). 27 tablet 1  . zolpidem (AMBIEN) 10 MG tablet TAKE 1 TABLET BY MOUTH AT BEDTIME 90 tablet 1  . Fish Oil OIL Take 2 capsules by mouth daily.     . fluconazole (DIFLUCAN) 150 MG tablet 1 tab po qd x 3d 3 tablet 1  . sucralfate (CARAFATE) 1 G tablet 1 tab about 1 hour prior to each meal prn     No facility-administered medications prior to visit.    PE: Blood pressure 123/84, pulse 114, temperature 99 F (37.2 C), temperature source Temporal, resp. rate 18, height 5' 4.5" (1.638 m), weight 224 lb (101.606 kg), SpO2 98 %. Gen: Alert, well appearing.  Patient is oriented to person, place, time, and situation. Affect: pleasant, cries some when speaking about her pain and the emotional stress of her family life lately.  Lucid thought and speech. ZOX:WRUE: no injection, icteris, swelling, or exudate.  EOMI, PERRLA. Mouth: lips without lesion/swelling.  Oral mucosa pink and moist. Oropharynx without erythema, exudate, or swelling.  CV: RRR, no m/r/g.   LUNGS: CTA bilat, nonlabored resps, good aeration in all lung fields. Tender along all musculoskeletal points palpated today from head to toe.  No joint swelling or erythema or stiffness.   EXT: no  c/c/e Skin - no sores or suspicious lesions or rashes or color changes   LAB: none today RECENT: Lab Results  Component Value Date   WBC 10.2 01/29/2014   HGB 11.5* 01/29/2014   HCT 35.0* 01/29/2014   MCV 82.5 01/29/2014   PLT 308.0 01/29/2014     Chemistry      Component Value Date/Time   NA 132* 01/29/2014 1331   K 4.3 01/29/2014 1331   CL 95* 01/29/2014 1331   CO2 28 01/29/2014 1331   BUN 4* 01/29/2014 1331   CREATININE 0.7 01/29/2014 1331   CREATININE 0.70 04/29/2013 1611      Component Value Date/Time   CALCIUM 9.5 01/29/2014 1331   ALKPHOS  67 01/29/2014 1331   AST 29 01/29/2014 1331   ALT 24 01/29/2014 1331   BILITOT 0.3 01/29/2014 1331     Lab Results  Component Value Date   TSH 0.578 04/29/2013    IMPRESSION AND PLAN:  1) Chronic pain syndrome/fibromyalgia: No changes in meds. I printed rx's for MS contin 30mg  1 bid + MS contin 15mg  1 bid (#60 of each), as well as Vicodin 10/325 1-2 q6h-max of 6 per 24h, #180 today for this month, Feb 2016, and March 2016.  Appropriate fill on/after date was noted on each rx. Plan on doing urine tox screen next f/u visit.  2) RLS: a little worse lately.  Push mirapex dose to the max of 4 of the 0.125mg  tabs qhs.  Check CBC.    3) Multinodular thyroid/goiter: she has been euthyroid. Will check TSH.  Will arrange repeat thyroid u/s (most recent one was 3 yrs ago).  4) Asthma, moderate persistent: stable.  Continue current meds.  5) chronic nausea, unclear etiology.   Suspect at least partially due to GERD and severe anxiety.  CMET today.  An After Visit Summary was printed and given to the patient.  FOLLOW UP: 3 mo f/u chronic pain/fibro

## 2014-08-12 ENCOUNTER — Ambulatory Visit: Payer: Medicare Other | Admitting: Family Medicine

## 2014-08-12 LAB — CBC WITH DIFFERENTIAL/PLATELET
BASOS PCT: 0.4 % (ref 0.0–3.0)
Basophils Absolute: 0 10*3/uL (ref 0.0–0.1)
EOS PCT: 0.6 % (ref 0.0–5.0)
Eosinophils Absolute: 0 10*3/uL (ref 0.0–0.7)
HCT: 38 % (ref 36.0–46.0)
HEMOGLOBIN: 11.9 g/dL — AB (ref 12.0–15.0)
LYMPHS PCT: 20.7 % (ref 12.0–46.0)
Lymphs Abs: 1.1 10*3/uL (ref 0.7–4.0)
MCHC: 31.4 g/dL (ref 30.0–36.0)
MCV: 78.8 fl (ref 78.0–100.0)
MONOS PCT: 5.3 % (ref 3.0–12.0)
Monocytes Absolute: 0.3 10*3/uL (ref 0.1–1.0)
NEUTROS ABS: 3.9 10*3/uL (ref 1.4–7.7)
Neutrophils Relative %: 73 % (ref 43.0–77.0)
Platelets: 335 10*3/uL (ref 150.0–400.0)
RBC: 4.82 Mil/uL (ref 3.87–5.11)
RDW: 16.7 % — ABNORMAL HIGH (ref 11.5–15.5)
WBC: 5.4 10*3/uL (ref 4.0–10.5)

## 2014-08-12 LAB — COMPREHENSIVE METABOLIC PANEL
ALBUMIN: 4.4 g/dL (ref 3.5–5.2)
ALK PHOS: 103 U/L (ref 39–117)
ALT: 45 U/L — AB (ref 0–35)
AST: 37 U/L (ref 0–37)
BILIRUBIN TOTAL: 0.4 mg/dL (ref 0.2–1.2)
BUN: 10 mg/dL (ref 6–23)
CO2: 26 mEq/L (ref 19–32)
Calcium: 9.5 mg/dL (ref 8.4–10.5)
Chloride: 94 mEq/L — ABNORMAL LOW (ref 96–112)
Creatinine, Ser: 0.8 mg/dL (ref 0.4–1.2)
GFR: 81.98 mL/min (ref >60.00)
Glucose, Bld: 105 mg/dL — ABNORMAL HIGH (ref 70–99)
POTASSIUM: 4.6 meq/L (ref 3.5–5.1)
Sodium: 127 mEq/L — ABNORMAL LOW (ref 135–145)
TOTAL PROTEIN: 8.3 g/dL (ref 6.0–8.3)

## 2014-08-12 LAB — TSH: TSH: 1.03 u[IU]/mL (ref 0.35–4.50)

## 2014-08-22 ENCOUNTER — Encounter: Payer: Self-pay | Admitting: Family Medicine

## 2014-09-24 ENCOUNTER — Other Ambulatory Visit: Payer: Self-pay | Admitting: Family Medicine

## 2014-09-24 ENCOUNTER — Telehealth: Payer: Self-pay | Admitting: Family Medicine

## 2014-09-24 MED ORDER — ALBUTEROL SULFATE HFA 108 (90 BASE) MCG/ACT IN AERS: 2.0000 | INHALATION_SPRAY | RESPIRATORY_TRACT | Status: AC | PRN

## 2014-09-24 NOTE — Telephone Encounter (Signed)
Pt requesting 3 month Rx of her ambien.  Last Rx printed 03/23/15 x 1 rf.  Please advise.

## 2014-09-26 MED ORDER — ZOLPIDEM TARTRATE 10 MG PO TABS: ORAL_TABLET | ORAL | Status: DC

## 2014-09-26 NOTE — Telephone Encounter (Signed)
Ambien rx printed as per pt's request.

## 2014-09-27 NOTE — Telephone Encounter (Signed)
Rx faxed in to pharmacy.

## 2014-11-10 ENCOUNTER — Ambulatory Visit: Payer: BLUE CROSS/BLUE SHIELD | Admitting: Family Medicine

## 2014-11-12 ENCOUNTER — Ambulatory Visit (INDEPENDENT_AMBULATORY_CARE_PROVIDER_SITE_OTHER): Payer: Medicare Other | Admitting: Family Medicine

## 2014-11-12 ENCOUNTER — Encounter: Payer: Self-pay | Admitting: Family Medicine

## 2014-11-12 VITALS — BP 125/89 | HR 131 | Temp 99.6°F | Ht 64.5 in | Wt 223.0 lb

## 2014-11-12 DIAGNOSIS — M797 Fibromyalgia: Secondary | ICD-10-CM

## 2014-11-12 DIAGNOSIS — D509 Iron deficiency anemia, unspecified: Secondary | ICD-10-CM

## 2014-11-12 DIAGNOSIS — E042 Nontoxic multinodular goiter: Secondary | ICD-10-CM

## 2014-11-12 DIAGNOSIS — R110 Nausea: Secondary | ICD-10-CM

## 2014-11-12 DIAGNOSIS — G2581 Restless legs syndrome: Secondary | ICD-10-CM

## 2014-11-12 LAB — CBC WITH DIFFERENTIAL/PLATELET
Basophils Absolute: 0 10*3/uL (ref 0.0–0.1)
Basophils Relative: 0 % (ref 0–1)
EOS PCT: 0 % (ref 0–5)
Eosinophils Absolute: 0 10*3/uL (ref 0.0–0.7)
HCT: 36 % (ref 36.0–46.0)
Hemoglobin: 11.8 g/dL — ABNORMAL LOW (ref 12.0–15.0)
LYMPHS ABS: 1.6 10*3/uL (ref 0.7–4.0)
Lymphocytes Relative: 12 % (ref 12–46)
MCH: 23.7 pg — AB (ref 26.0–34.0)
MCHC: 32.8 g/dL (ref 30.0–36.0)
MCV: 72.4 fL — ABNORMAL LOW (ref 78.0–100.0)
MPV: 9.9 fL (ref 8.6–12.4)
Monocytes Absolute: 0.7 10*3/uL (ref 0.1–1.0)
Monocytes Relative: 5 % (ref 3–12)
Neutro Abs: 11.1 10*3/uL — ABNORMAL HIGH (ref 1.7–7.7)
Neutrophils Relative %: 83 % — ABNORMAL HIGH (ref 43–77)
Platelets: 395 10*3/uL (ref 150–400)
RBC: 4.97 MIL/uL (ref 3.87–5.11)
RDW: 18 % — ABNORMAL HIGH (ref 11.5–15.5)
WBC: 13.4 10*3/uL — ABNORMAL HIGH (ref 4.0–10.5)

## 2014-11-12 LAB — FERRITIN: Ferritin: 16 ng/mL (ref 10–291)

## 2014-11-12 LAB — TSH: TSH: 1.502 u[IU]/mL (ref 0.350–4.500)

## 2014-11-12 MED ORDER — HYDROCODONE-ACETAMINOPHEN 10-325 MG PO TABS: ORAL_TABLET | ORAL | Status: DC

## 2014-11-12 MED ORDER — CLONAZEPAM 1 MG PO TABS: ORAL_TABLET | ORAL | Status: AC

## 2014-11-12 MED ORDER — MORPHINE SULFATE ER 30 MG PO TBCR: EXTENDED_RELEASE_TABLET | ORAL | Status: DC

## 2014-11-12 MED ORDER — FLUTICASONE PROPIONATE HFA 220 MCG/ACT IN AERO: 1.0000 | INHALATION_SPRAY | Freq: Two times a day (BID) | RESPIRATORY_TRACT | Status: AC

## 2014-11-12 MED ORDER — CYCLOBENZAPRINE HCL 10 MG PO TABS: 10.0000 mg | ORAL_TABLET | Freq: Three times a day (TID) | ORAL | Status: DC | PRN

## 2014-11-12 MED ORDER — MORPHINE SULFATE ER 15 MG PO TBCR
EXTENDED_RELEASE_TABLET | ORAL | Status: DC
Start: 2014-11-12 — End: 2014-11-12

## 2014-11-12 MED ORDER — ONDANSETRON 8 MG PO TBDP: ORAL_TABLET | ORAL | Status: AC

## 2014-11-12 MED ORDER — GABAPENTIN ENACARBIL ER 600 MG PO TBCR: EXTENDED_RELEASE_TABLET | ORAL | Status: DC

## 2014-11-12 MED ORDER — ZOLPIDEM TARTRATE 10 MG PO TABS: ORAL_TABLET | ORAL | Status: AC

## 2014-11-12 MED ORDER — MORPHINE SULFATE ER 15 MG PO TBCR: EXTENDED_RELEASE_TABLET | ORAL | Status: DC

## 2014-11-12 NOTE — Progress Notes (Signed)
Pre visit review using our clinic review tool, if applicable. No additional management support is needed unless otherwise documented below in the visit note. 

## 2014-11-12 NOTE — Progress Notes (Signed)
OFFICE NOTE  11/12/2014  CC:  Chief Complaint  Patient presents with  . Follow-up    3 months   HPI: Patient is a 49 y.o. Caucasian female who is here for 3 mo f/u chronic pain syndrome/severe fibromyalgia chronic anxiety, RLS/ insomnia, and persistent asthma. She increased her mirapex to the max dose last visit but says her RLS continues to get worse. She limits caffeine to 1-2 drinks early in daytime. She feels mild gastritis sx's lately but no melena.  She takes zofran every few days most of the time but for the last 1 wk she has taken it daily, says she needs RF b/c rx expired.  Most recent dose of morphine was 2 days ago. Most recent dose of vicodin was yesterday (one tab).   Most recent clonaz: last night. Most recent ambien: last night. Says pain control is about the same, asks about possible switch to different narcotics to see if control would be any better but this is a conversation we have had before and I had to shut this line of questioning down pretty quick today (see a/p).  Denies any recent SOB or wheezing, feels like asthma is stable, says she is compliant with advair, rarely requires albut rescue.  Denies feeling of racing heart or palpitations.  We have been watching her mild/mod chronic sinus tachycardia for quite a while now and I feel this may be secondary to her chronic use of advair (with its beta agonist).    Pertinent PMH:  Past medical, surgical, social, and family history reviewed and no changes are noted since last office visit.  MEDS:  Outpatient Prescriptions Prior to Visit  Medication Sig Dispense Refill  . albuterol (PROAIR HFA) 108 (90 BASE) MCG/ACT inhaler Inhale 2 puffs into the lungs every 4 (four) hours as needed. Shortness of breath 1 Inhaler 1  . amLODipine (NORVASC) 5 MG tablet Take 1 tablet (5 mg total) by mouth daily. 90 tablet 1  . Calcium Carbonate (CALCIUM 500 PO) Take 1 tablet by mouth daily.     . clonazePAM (KLONOPIN) 1 MG tablet TAKE  1 TABLET BY MOUTH TWICE A DAY AS NEEDED 180 tablet 1  . cyclobenzaprine (FLEXERIL) 10 MG tablet Take 1 tablet (10 mg total) by mouth 3 (three) times daily as needed for muscle spasms. 270 tablet 1  . Fluticasone-Salmeterol (ADVAIR) 500-50 MCG/DOSE AEPB Inhale 1 puff into the lungs 2 (two) times daily. 60 each 6  . HYDROcodone-acetaminophen (NORCO) 10-325 MG per tablet 1-2 tabs po q6h prn breakthrough pain, max of 6 tabs in 24 hour period 180 tablet 0  . hyoscyamine (LEVSIN SL) 0.125 MG SL tablet Take 1-2 tablets (0.125-0.25 mg total) by mouth every 6 (six) hours as needed for cramping. 270 tablet 1  . MAGNESIUM PO Take 1 tablet by mouth daily. Unknown strength    . montelukast (SINGULAIR) 10 MG tablet Take 1 tablet (10 mg total) by mouth at bedtime. 90 tablet 1  . morphine (MS CONTIN) 15 MG 12 hr tablet 1 tab po q12h to be combined with a 30mg  MS contin at each dose 60 tablet 0  . morphine (MS CONTIN) 30 MG 12 hr tablet 1 tab po bid to be combined with a 15mg  MS contin tab at each dose 60 tablet 0  . Omega-3 Fatty Acids (FISH OIL) 1000 MG CAPS Take by mouth. Two daily    . ondansetron (ZOFRAN-ODT) 8 MG disintegrating tablet 1 tab po tid prn nausea 270 tablet 1  .  pantoprazole (PROTONIX) 40 MG tablet TAKE 1 TABLET BY MOUTH EVERY DAY 30 tablet 3  . phenazopyridine (PYRIDIUM) 200 MG tablet Take 1 tablet (200 mg total) by mouth 3 (three) times daily as needed for pain. 90 tablet 2  . polyethylene glycol powder (GLYCOLAX/MIRALAX) powder DISSOLVE 1 CAPFUL INTO LIQUID AND TAKE BY MOUTH ONE TO THREE TIMES DAILY AS NEEDED FOR CONSTIPATION 1581 g 3  . pramipexole (MIRAPEX) 0.125 MG tablet 4 tabs po qhs 360 tablet 3  . ranitidine (ZANTAC) 150 MG tablet Take 1 tablet (150 mg total) by mouth 2 (two) times daily. 180 tablet 4  . sucralfate (CARAFATE) 1 G tablet 1 tab about 1 hour prior to each meal prn    . SUMAtriptan (IMITREX) 50 MG tablet Take 2 tablets (100 mg total) by mouth every 2 (two) hours as needed  (migraine HA (max of /24h). 27 tablet 1  . zolpidem (AMBIEN) 10 MG tablet TAKE 1 TABLET BY MOUTH AT BEDTIME 90 tablet 1   No facility-administered medications prior to visit.    PE: Blood pressure 125/89, pulse 131, temperature 99.6 F (37.6 C), temperature source Temporal, height 5' 4.5" (1.638 m), weight 223 lb (101.152 kg), SpO2 98 %. Gen: Alert, well appearing.  Patient is oriented to person, place, time, and situation. WUJ:WJXB: no injection, icteris, swelling, or exudate.  EOMI, PERRLA. Mouth: lips without lesion/swelling.  Oral mucosa pink and moist. Oropharynx without erythema, exudate, or swelling.  Neck - No masses or thyromegaly or limitation in range of motion CV: regular, tachy to 120s, no m/r/g LUNGS: CTA bilat, nonlabored resps. EXT: no clubbing, cyanosis, or edema.    IMPRESSION AND PLAN:  1) Uncontrolled RLS; trial of Horizant  qhs.  Decrease mirapex by 1 tab qhs every 5d until she gets off this med. Check CBC with iron studies and BMET.  2) Chronic pain/fibromyalgia: once again, pt wanting to try different narcotics but I again agreed only to continue current pain med regimen.  I would rather she get off of narcotics altogether but she insists these allow her enough relief to remain functional.  I told her any change or increase in her narcotic regimen would have to be done by a pain mgmt MD, which I offer to refer her to every visit but she declines. Urine tox screen today. I printed rx's for MS contin 15 and 30 mg, 1 of each bid, #60 of each AND vicodin 10/325 1-2 q6h prn, #180  today for this month, May 2016, and June 2016.  Appropriate fill on/after date was noted on each rx.  Of note, today she said she was going to get her meds filled at [facility] (noted b/c this is not the pharmacy documented on her controlled substance agreement in chart).  3) Tachycardia; suspect may be due to her beta agonist in her advair. Will change her to flovent 220 mcg, 1 p  bid. Recheck TSH.  4) Hx of multinodular goiter: hx of dominant nodule on each lobe that had benign bx a few years ago. Order f/u ultrasound of thyroid to follow this up.  An After Visit Summary was printed and given to the patient.  FOLLOW UP: 3 mo

## 2014-11-16 LAB — BASIC METABOLIC PANEL
BUN: 8 mg/dL (ref 6–23)
CO2: 20 meq/L (ref 19–32)
Calcium: 9.6 mg/dL (ref 8.4–10.5)
Chloride: 90 mEq/L — ABNORMAL LOW (ref 96–112)
Creat: 0.75 mg/dL (ref 0.50–1.10)
Glucose, Bld: 89 mg/dL (ref 70–99)
POTASSIUM: 4.2 meq/L (ref 3.5–5.3)
SODIUM: 123 meq/L — AB (ref 135–145)

## 2014-11-16 LAB — IRON AND TIBC
%SAT: 5 % — ABNORMAL LOW (ref 20–55)
IRON: 23 ug/dL — AB (ref 42–145)
TIBC: 492 ug/dL — ABNORMAL HIGH (ref 250–470)
UIBC: 469 ug/dL — ABNORMAL HIGH (ref 125–400)

## 2014-11-18 ENCOUNTER — Ambulatory Visit (HOSPITAL_COMMUNITY): Payer: Medicare Other

## 2014-11-19 ENCOUNTER — Other Ambulatory Visit: Payer: Self-pay | Admitting: Family Medicine

## 2014-11-19 DIAGNOSIS — D72829 Elevated white blood cell count, unspecified: Secondary | ICD-10-CM

## 2014-11-19 DIAGNOSIS — D509 Iron deficiency anemia, unspecified: Secondary | ICD-10-CM

## 2014-11-19 DIAGNOSIS — E871 Hypo-osmolality and hyponatremia: Secondary | ICD-10-CM

## 2014-11-25 ENCOUNTER — Ambulatory Visit (HOSPITAL_COMMUNITY): Payer: Medicare Other | Attending: Family Medicine

## 2014-12-07 ENCOUNTER — Encounter: Payer: Self-pay | Admitting: Family Medicine

## 2015-02-10 ENCOUNTER — Ambulatory Visit (INDEPENDENT_AMBULATORY_CARE_PROVIDER_SITE_OTHER): Payer: Medicare Other | Admitting: Family Medicine

## 2015-02-10 ENCOUNTER — Encounter: Payer: Self-pay | Admitting: Family Medicine

## 2015-02-10 VITALS — BP 106/74 | HR 134 | Temp 98.4°F | Resp 16 | Ht 64.5 in | Wt 232.0 lb

## 2015-02-10 DIAGNOSIS — E871 Hypo-osmolality and hyponatremia: Secondary | ICD-10-CM | POA: Diagnosis not present

## 2015-02-10 DIAGNOSIS — R5382 Chronic fatigue, unspecified: Secondary | ICD-10-CM | POA: Diagnosis not present

## 2015-02-10 DIAGNOSIS — R609 Edema, unspecified: Secondary | ICD-10-CM

## 2015-02-10 DIAGNOSIS — Z79899 Other long term (current) drug therapy: Secondary | ICD-10-CM | POA: Diagnosis not present

## 2015-02-10 DIAGNOSIS — M797 Fibromyalgia: Secondary | ICD-10-CM

## 2015-02-10 DIAGNOSIS — D72829 Elevated white blood cell count, unspecified: Secondary | ICD-10-CM

## 2015-02-10 DIAGNOSIS — D509 Iron deficiency anemia, unspecified: Secondary | ICD-10-CM

## 2015-02-10 DIAGNOSIS — I471 Supraventricular tachycardia: Secondary | ICD-10-CM

## 2015-02-10 DIAGNOSIS — G8929 Other chronic pain: Secondary | ICD-10-CM

## 2015-02-10 DIAGNOSIS — M549 Dorsalgia, unspecified: Secondary | ICD-10-CM

## 2015-02-10 DIAGNOSIS — R000 Tachycardia, unspecified: Secondary | ICD-10-CM

## 2015-02-10 DIAGNOSIS — G9332 Myalgic encephalomyelitis/chronic fatigue syndrome: Secondary | ICD-10-CM

## 2015-02-10 LAB — POCT URINALYSIS DIPSTICK
Bilirubin, UA: NEGATIVE
Blood, UA: NEGATIVE
Glucose, UA: NEGATIVE
KETONES UA: NEGATIVE
Leukocytes, UA: NEGATIVE
NITRITE UA: NEGATIVE
PH UA: 7
PROTEIN UA: NEGATIVE
Spec Grav, UA: 1.01
Urobilinogen, UA: 0.2

## 2015-02-10 MED ORDER — MORPHINE SULFATE ER 30 MG PO TBCR: EXTENDED_RELEASE_TABLET | ORAL | Status: AC

## 2015-02-10 MED ORDER — HYDROCODONE-ACETAMINOPHEN 10-325 MG PO TABS: ORAL_TABLET | ORAL | Status: AC

## 2015-02-10 MED ORDER — MORPHINE SULFATE ER 15 MG PO TBCR: EXTENDED_RELEASE_TABLET | ORAL | Status: AC

## 2015-02-10 NOTE — Progress Notes (Signed)
Pre visit review using our clinic review tool, if applicable. No additional management support is needed unless otherwise documented below in the visit note. 

## 2015-02-10 NOTE — Progress Notes (Signed)
OFFICE VISIT  02/12/2015   CC:  Chief Complaint  Patient presents with  . Follow-up    3 month f/u.    HPI:    Patient is a 49 y.o. Caucasian female who presents for 3 mo f/u chronic pain syndrome/fibromyalgia. I confronted her about her recent omission of her narcotic pain meds from her med list when she saw Dr. Charlett Blake. I called Dr. Charlett Blake to confirm that the patient had not listed her narcotic pain meds anywhere on their paperwork. I told the patient today that that this omission was very suspicious since these meds would factor into Dr. Eilleen Kempf decision to prescribe her the tizanidine that she was requesting.  Therefore, I told pt today I was no longer going to manage her pain.  An argument ensued today, in which she said that a tech at Dr. Eilleen Kempf office said she would "look the meds up" since the list of meds was long. However, I told her that it was her responsibility to make Dr. Charlett Blake aware of her daily meds, and she clearly didin't do that. I told her that I think our Doctor-Patient relationship is fractured past the point of repair and I think it would be best for her to find a new PCP.  I also told her I would refer her to pain mgmt clinic and I am going to prescribe a 1 month supply of her MS contin 30mg +15mg  and her Norco 10/325.  I made it clear I would not be prescribing any further pain meds no matter what the circumstances.  I also presented to her the option of going to a psychiatrist for the purpose of getting onto suboxone as treatment for opioid dependence and get off of opioid pain meds altogether.  She said this insulted her.  She denies feeling palpitations or racing heart or CP.    She never got the labs that I wanted her to get back in April this year to further work-up her chronic hyponatremia, nor did she get the hemoccult cards or start iron therapy for her iron def anemia.  It is unclear from the [facility] notes whether the patient was told this info or not.  Pt's  recollection on this is sketchy.   Past Medical History  Diagnosis Date  . DDD (degenerative disc disease), lumbar   . Atypical chest pain     Question of coronary vasospasm; cath 10/20/10 NORMAL  . Anxiety   . Fibromyalgia     Dr. Hope Pigeon [phone] (pt has not seen since 2014)  . Interstitial cystitis   . Obesity   . Insomnia   . Transaminasemia 03/2010    (ALT 49), normal on f/u testing off of daily tylenol uuse.  Marland Kitchen GERD (gastroesophageal reflux disease) 06/01/10    Dyspepsia/gastritis, EGD 05/2010: gastritis (NSAIDS)  . Rectal bleeding 06/01/10    Anorectal bleeding/BRBPR--colonoscopy: internal hemorrhoids  . Recurrent UTI     renal u/s normal 07/2007  . Weight loss     CT abd/pelf 08/22/10 remakable only for bile duct 9mm (unchanged from 03/2004 PRIOR to ERCP w/spincterotomy, bile duct balloon dredging and lap chole)  . Recurrent fever     W/u unrevealing, including ID consult.    . Asthma   . Osteopenia     bone densitometry 2006 per pt  . 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 Christella Hartigan  . Multinodular goiter 11/2010    TSH normal (Bx 2012-nonneoplastic goiter)  . Chest pain   .  Rectal prolapse 02/2011    repaired lap [id number]  . Vitamin D deficiency 08/20/2011  . History of migraine headaches     [facility]: started nortriptyline 10mg --titrating to 3 caps qhs  . Edema 10/31/2011  . Lobar pneumonia mid May, 2013    RML and RLL, with sepsis--admission to [facility] 12/2011--responded extremely well to rocephin + zithromax.  . IBS (irritable bowel syndrome)   . De Quervain's tenosynovitis, left 11/11/2011  . Anemia, iron deficiency 06/14/2011    Hemoccults NEG x 3 in December 2012   . CAP (community acquired pneumonia) 04/29/2013  . Chronic nausea     Unclear etiology (GERD +anxiety?).  Gastric emptying study and CT abd/pelv w/contrast 2013 normal.    Past Surgical History  Procedure Laterality Date  . Bunionectomy      right  foot  . Abdominal hysterectomy    . Bilateral salpingoophorectomy  2003    endometriosis  . Cholecystectomy  2005    lap--(cholelithiasis w/choledocholithiasis on u/s 2005--PreOperative ERCP showed NO stone or other obstruction in the biliary tract, but CBD messured 9mm near head of pancreas.  S sphincterotomy and bile duct balloon dredging was done at that time.  MRCP 10/2010 NORMAL.  Marland Kitchen. Orif metatarsal fracture  2006    Right 2nd and 3rd metatarsals (Dr. Romeo AppleHarrison)  . Biopsy thyroid  11/2010    Fine needle: NONmalignant goiter  . Rectal prolapse repair, rectopexy  [id number]    with sigmoid colectomy    Outpatient Prescriptions Prior to Visit  Medication Sig Dispense Refill  . amLODipine (NORVASC) 5 MG tablet Take 1 tablet (5 mg total) by mouth daily. 90 tablet 1  . Calcium Carbonate (CALCIUM 500 PO) Take 1 tablet by mouth daily.     . clonazePAM (KLONOPIN) 1 MG tablet TAKE 1 TABLET BY MOUTH TWICE A DAY AS NEEDED 180 tablet 1  . fluticasone (FLOVENT HFA) 220 MCG/ACT inhaler Inhale 1 puff into the lungs 2 (two) times daily. 1 Inhaler 12  . hyoscyamine (LEVSIN SL) 0.125 MG SL tablet Take 1-2 tablets (0.125-0.25 mg total) by mouth every 6 (six) hours as needed for cramping. 270 tablet 1  . MAGNESIUM PO Take 1 tablet by mouth daily. Unknown strength    . montelukast (SINGULAIR) 10 MG tablet Take 1 tablet (10 mg total) by mouth at bedtime. 90 tablet 1  . Omega-3 Fatty Acids (FISH OIL) 1000 MG CAPS Take by mouth. Two daily    . ondansetron (ZOFRAN-ODT) 8 MG disintegrating tablet 1 tab po tid prn nausea 270 tablet 1  . pantoprazole (PROTONIX) 40 MG tablet TAKE 1 TABLET BY MOUTH EVERY DAY 30 tablet 3  . phenazopyridine (PYRIDIUM) 200 MG tablet Take 1 tablet (200 mg total) by mouth 3 (three) times daily as needed for pain. 90 tablet 2  . polyethylene glycol powder (GLYCOLAX/MIRALAX) powder DISSOLVE 1 CAPFUL INTO LIQUID AND TAKE BY MOUTH ONE TO THREE TIMES DAILY AS NEEDED FOR CONSTIPATION 1581 g 3  .  pramipexole (MIRAPEX) 0.125 MG tablet 4 tabs po qhs 360 tablet 3  . ranitidine (ZANTAC) 150 MG tablet Take 1 tablet (150 mg total) by mouth 2 (two) times daily. 180 tablet 4  . sucralfate (CARAFATE) 1 G tablet 1 tab about 1 hour prior to each meal prn    . SUMAtriptan (IMITREX) 50 MG tablet Take 2 tablets (100 mg total) by mouth every 2 (two) hours as needed (migraine HA (max of 200mg /24h). 27 tablet 1  . zolpidem (  AMBIEN) 10 MG tablet TAKE 1 TABLET BY MOUTH AT BEDTIME 90 tablet 1  . cyclobenzaprine (FLEXERIL) 10 MG tablet Take 1 tablet (10 mg total) by mouth 3 (three) times daily as needed for muscle spasms. 270 tablet 1  . HYDROcodone-acetaminophen (NORCO) 10-325 MG per tablet 1-2 tabs po q6h prn breakthrough pain, max of 6 tabs in 24 hour period 180 tablet 0  . morphine (MS CONTIN) 15 MG 12 hr tablet 1 tab po q12h to be combined with a  MS contin at each dose 60 tablet 0  . morphine (MS CONTIN) 30 MG 12 hr tablet 1 tab po bid to be combined with a  MS contin tab at each dose 60 tablet 0  . albuterol (PROAIR HFA) 108 (90 BASE) MCG/ACT inhaler Inhale 2 puffs into the lungs every 4 (four) hours as needed. Shortness of breath 1 Inhaler 1  . Gabapentin Enacarbil (HORIZANT) 600 MG TBCR 1 tab po qhs (Patient not taking: Reported on 02/10/2015) 30 tablet 6   No facility-administered medications prior to visit.    No Known Allergies  ROS As per HPI  PE: Blood pressure 106/74, pulse 134, temperature 98.4 F (36.9 C), temperature source Oral, resp. rate 16, height 5' 4.5" (1.638 m), weight 232 lb (105.235 kg), SpO2 96 %. Gen: Alert, well appearing.  Patient is oriented to person, place, time, and situation. No further exam today.  LABS:  None today  IMPRESSION AND PLAN:  1) Chronic pain syndrome/fibromyalgia and chronic fatigue syndrome:  See HPI for details. I referred her to a pain mgmt clinic today. I printed one month supply of each of her pain pills. I made it clear I would not  prescribe any further pain meds for her. Due to the fracture in our doctor/patient relationship that this problem has caused, I have recommended-- and she agreed-- that she seek a new PCP.    2) Unexplained sinus tachycardia: w/u unrevealing, see past notes.  Most recent trial of getting her off of her beta agonist (switching from advair to flovent) has made no difference.  She says she uses her albuterol too infrequently for this to be the cause of her chronic sinus tach.  I recommended pt see cardiologist and she said she would pursue this in her new hometown of [place], [place].  3) Chronic hyponatremia: she agreed to do the labs I had wanted to do 11/2014 (urine osmo, urine Na, CMET). I am suspicious of an SIADH-type effect of one of her medications.  4) Iron def anemia: gave hemoccults today.  Recheck CBC, start ferrous sulfate  bid for 1 mo, then change to qd dosing.  Spent 25 min with pt today, with >50% of this time spent in counseling and care coordination regarding the above problems.  No other changes in meds made today.  FOLLOW UP: None made-prn until she finds a new PCP

## 2015-02-11 LAB — CBC WITH DIFFERENTIAL/PLATELET
BASOS ABS: 0 10*3/uL (ref 0.0–0.1)
Basophils Relative: 0.1 % (ref 0.0–3.0)
EOS PCT: 1.4 % (ref 0.0–5.0)
Eosinophils Absolute: 0.2 10*3/uL (ref 0.0–0.7)
HCT: 36 % (ref 36.0–46.0)
Hemoglobin: 11.2 g/dL — ABNORMAL LOW (ref 12.0–15.0)
LYMPHS PCT: 18.7 % (ref 12.0–46.0)
Lymphs Abs: 2.3 10*3/uL (ref 0.7–4.0)
MCHC: 31.1 g/dL (ref 30.0–36.0)
MCV: 74.3 fl — ABNORMAL LOW (ref 78.0–100.0)
Monocytes Absolute: 0.2 10*3/uL (ref 0.1–1.0)
Monocytes Relative: 1.9 % — ABNORMAL LOW (ref 3.0–12.0)
Neutro Abs: 9.7 10*3/uL — ABNORMAL HIGH (ref 1.4–7.7)
Neutrophils Relative %: 77.9 % — ABNORMAL HIGH (ref 43.0–77.0)
Platelets: 369 10*3/uL (ref 150.0–400.0)
RBC: 4.84 Mil/uL (ref 3.87–5.11)
RDW: 19 % — AB (ref 11.5–15.5)
WBC: 12.4 10*3/uL — ABNORMAL HIGH (ref 4.0–10.5)

## 2015-02-11 LAB — COMPREHENSIVE METABOLIC PANEL
ALBUMIN: 4.4 g/dL (ref 3.5–5.2)
ALK PHOS: 106 U/L (ref 39–117)
ALT: 17 U/L (ref 0–35)
AST: 19 U/L (ref 0–37)
BILIRUBIN TOTAL: 0.3 mg/dL (ref 0.2–1.2)
BUN: 10 mg/dL (ref 6–23)
CO2: 29 mEq/L (ref 19–32)
Calcium: 9.6 mg/dL (ref 8.4–10.5)
Chloride: 97 mEq/L (ref 96–112)
Creatinine, Ser: 0.72 mg/dL (ref 0.40–1.20)
GFR: 91.06 mL/min (ref >60.00)
Glucose, Bld: 86 mg/dL (ref 70–99)
POTASSIUM: 4.4 meq/L (ref 3.5–5.1)
SODIUM: 133 meq/L — AB (ref 135–145)
TOTAL PROTEIN: 7.8 g/dL (ref 6.0–8.3)

## 2015-02-11 LAB — SODIUM, URINE, RANDOM: Sodium, Ur: <10 mmol/L (ref 28–272)

## 2015-02-11 LAB — OSMOLALITY, URINE: OSMOLALITY UR: 149 mosm/kg — AB (ref 390–1090)

## 2019-09-10 DIAGNOSIS — L03213 Periorbital cellulitis: Secondary | ICD-10-CM | POA: Diagnosis not present

## 2019-09-10 DIAGNOSIS — I10 Essential (primary) hypertension: Secondary | ICD-10-CM | POA: Diagnosis not present

## 2019-09-10 DIAGNOSIS — Z7951 Long term (current) use of inhaled steroids: Secondary | ICD-10-CM | POA: Diagnosis not present

## 2019-09-10 DIAGNOSIS — J45909 Unspecified asthma, uncomplicated: Secondary | ICD-10-CM | POA: Diagnosis not present

## 2019-09-10 DIAGNOSIS — Z79899 Other long term (current) drug therapy: Secondary | ICD-10-CM | POA: Diagnosis not present

## 2019-09-10 DIAGNOSIS — H5711 Ocular pain, right eye: Secondary | ICD-10-CM | POA: Diagnosis not present

## 2019-09-10 DIAGNOSIS — R519 Headache, unspecified: Secondary | ICD-10-CM | POA: Diagnosis not present

## 2019-09-18 DIAGNOSIS — G2581 Restless legs syndrome: Secondary | ICD-10-CM | POA: Diagnosis not present

## 2019-09-18 DIAGNOSIS — Z6837 Body mass index (BMI) 37.0-37.9, adult: Secondary | ICD-10-CM | POA: Diagnosis not present

## 2019-09-18 DIAGNOSIS — Z789 Other specified health status: Secondary | ICD-10-CM | POA: Diagnosis not present

## 2019-09-18 DIAGNOSIS — Z299 Encounter for prophylactic measures, unspecified: Secondary | ICD-10-CM | POA: Diagnosis not present

## 2019-09-18 DIAGNOSIS — R030 Elevated blood-pressure reading, without diagnosis of hypertension: Secondary | ICD-10-CM | POA: Diagnosis not present

## 2019-09-18 DIAGNOSIS — M797 Fibromyalgia: Secondary | ICD-10-CM | POA: Diagnosis not present

## 2019-09-18 DIAGNOSIS — L03213 Periorbital cellulitis: Secondary | ICD-10-CM | POA: Diagnosis not present

## 2019-10-05 DIAGNOSIS — L03213 Periorbital cellulitis: Secondary | ICD-10-CM | POA: Diagnosis not present

## 2019-10-05 DIAGNOSIS — Z789 Other specified health status: Secondary | ICD-10-CM | POA: Diagnosis not present

## 2019-10-05 DIAGNOSIS — Z6838 Body mass index (BMI) 38.0-38.9, adult: Secondary | ICD-10-CM | POA: Diagnosis not present

## 2019-10-05 DIAGNOSIS — R030 Elevated blood-pressure reading, without diagnosis of hypertension: Secondary | ICD-10-CM | POA: Diagnosis not present

## 2019-10-05 DIAGNOSIS — Z299 Encounter for prophylactic measures, unspecified: Secondary | ICD-10-CM | POA: Diagnosis not present

## 2019-10-08 DIAGNOSIS — H5711 Ocular pain, right eye: Secondary | ICD-10-CM | POA: Diagnosis not present

## 2019-10-08 DIAGNOSIS — H11421 Conjunctival edema, right eye: Secondary | ICD-10-CM | POA: Diagnosis not present

## 2019-10-29 DIAGNOSIS — Z23 Encounter for immunization: Secondary | ICD-10-CM | POA: Diagnosis not present

## 2019-10-30 DIAGNOSIS — M797 Fibromyalgia: Secondary | ICD-10-CM | POA: Diagnosis not present

## 2019-10-30 DIAGNOSIS — R030 Elevated blood-pressure reading, without diagnosis of hypertension: Secondary | ICD-10-CM | POA: Diagnosis not present

## 2019-10-30 DIAGNOSIS — Z299 Encounter for prophylactic measures, unspecified: Secondary | ICD-10-CM | POA: Diagnosis not present

## 2019-10-30 DIAGNOSIS — R5383 Other fatigue: Secondary | ICD-10-CM | POA: Diagnosis not present

## 2019-10-30 DIAGNOSIS — Z6838 Body mass index (BMI) 38.0-38.9, adult: Secondary | ICD-10-CM | POA: Diagnosis not present

## 2020-02-05 DIAGNOSIS — R5383 Other fatigue: Secondary | ICD-10-CM | POA: Diagnosis not present

## 2020-02-05 DIAGNOSIS — R030 Elevated blood-pressure reading, without diagnosis of hypertension: Secondary | ICD-10-CM | POA: Diagnosis not present

## 2020-02-05 DIAGNOSIS — G2581 Restless legs syndrome: Secondary | ICD-10-CM | POA: Diagnosis not present

## 2020-02-05 DIAGNOSIS — Z299 Encounter for prophylactic measures, unspecified: Secondary | ICD-10-CM | POA: Diagnosis not present

## 2020-02-05 DIAGNOSIS — M797 Fibromyalgia: Secondary | ICD-10-CM | POA: Diagnosis not present

## 2020-02-05 DIAGNOSIS — Z6836 Body mass index (BMI) 36.0-36.9, adult: Secondary | ICD-10-CM | POA: Diagnosis not present

## 2020-04-15 DIAGNOSIS — F419 Anxiety disorder, unspecified: Secondary | ICD-10-CM | POA: Diagnosis not present

## 2020-04-15 DIAGNOSIS — Z7189 Other specified counseling: Secondary | ICD-10-CM | POA: Diagnosis not present

## 2020-04-15 DIAGNOSIS — Z299 Encounter for prophylactic measures, unspecified: Secondary | ICD-10-CM | POA: Diagnosis not present

## 2020-04-15 DIAGNOSIS — Z1339 Encounter for screening examination for other mental health and behavioral disorders: Secondary | ICD-10-CM | POA: Diagnosis not present

## 2020-04-15 DIAGNOSIS — Z79899 Other long term (current) drug therapy: Secondary | ICD-10-CM | POA: Diagnosis not present

## 2020-04-15 DIAGNOSIS — Z6835 Body mass index (BMI) 35.0-35.9, adult: Secondary | ICD-10-CM | POA: Diagnosis not present

## 2020-04-15 DIAGNOSIS — R5383 Other fatigue: Secondary | ICD-10-CM | POA: Diagnosis not present

## 2020-04-15 DIAGNOSIS — E559 Vitamin D deficiency, unspecified: Secondary | ICD-10-CM | POA: Diagnosis not present

## 2020-04-15 DIAGNOSIS — Z0000 Encounter for general adult medical examination without abnormal findings: Secondary | ICD-10-CM | POA: Diagnosis not present

## 2020-04-15 DIAGNOSIS — Z1331 Encounter for screening for depression: Secondary | ICD-10-CM | POA: Diagnosis not present

## 2020-05-17 DIAGNOSIS — E2839 Other primary ovarian failure: Secondary | ICD-10-CM | POA: Diagnosis not present

## 2020-08-19 ENCOUNTER — Other Ambulatory Visit: Payer: Medicare Other

## 2020-09-19 ENCOUNTER — Encounter: Payer: Self-pay | Admitting: Gastroenterology

## 2020-12-08 DIAGNOSIS — Z6838 Body mass index (BMI) 38.0-38.9, adult: Secondary | ICD-10-CM | POA: Diagnosis not present

## 2020-12-08 DIAGNOSIS — J45909 Unspecified asthma, uncomplicated: Secondary | ICD-10-CM | POA: Diagnosis not present

## 2020-12-08 DIAGNOSIS — G4700 Insomnia, unspecified: Secondary | ICD-10-CM | POA: Diagnosis not present

## 2020-12-08 DIAGNOSIS — G2581 Restless legs syndrome: Secondary | ICD-10-CM | POA: Diagnosis not present

## 2020-12-08 DIAGNOSIS — Z299 Encounter for prophylactic measures, unspecified: Secondary | ICD-10-CM | POA: Diagnosis not present

## 2021-04-28 DIAGNOSIS — Z23 Encounter for immunization: Secondary | ICD-10-CM | POA: Diagnosis not present

## 2021-04-28 DIAGNOSIS — Z1339 Encounter for screening examination for other mental health and behavioral disorders: Secondary | ICD-10-CM | POA: Diagnosis not present

## 2021-04-28 DIAGNOSIS — Z1331 Encounter for screening for depression: Secondary | ICD-10-CM | POA: Diagnosis not present

## 2021-04-28 DIAGNOSIS — Z6839 Body mass index (BMI) 39.0-39.9, adult: Secondary | ICD-10-CM | POA: Diagnosis not present

## 2021-04-28 DIAGNOSIS — Z299 Encounter for prophylactic measures, unspecified: Secondary | ICD-10-CM | POA: Diagnosis not present

## 2021-04-28 DIAGNOSIS — Z0000 Encounter for general adult medical examination without abnormal findings: Secondary | ICD-10-CM | POA: Diagnosis not present

## 2021-04-28 DIAGNOSIS — R5383 Other fatigue: Secondary | ICD-10-CM | POA: Diagnosis not present

## 2021-04-28 DIAGNOSIS — Z7189 Other specified counseling: Secondary | ICD-10-CM | POA: Diagnosis not present

## 2021-05-15 ENCOUNTER — Other Ambulatory Visit: Payer: Self-pay | Admitting: Internal Medicine

## 2021-05-15 DIAGNOSIS — Z139 Encounter for screening, unspecified: Secondary | ICD-10-CM

## 2021-05-16 ENCOUNTER — Ambulatory Visit
Admission: RE | Admit: 2021-05-16 | Discharge: 2021-05-16 | Disposition: A | Discharge status: Home / Self Care | Payer: Medicare HMO | Source: Ambulatory Visit | Attending: Internal Medicine | Admitting: Internal Medicine

## 2021-05-16 ENCOUNTER — Other Ambulatory Visit: Payer: Self-pay

## 2021-05-16 DIAGNOSIS — E559 Vitamin D deficiency, unspecified: Secondary | ICD-10-CM | POA: Diagnosis not present

## 2021-05-16 DIAGNOSIS — R5383 Other fatigue: Secondary | ICD-10-CM | POA: Diagnosis not present

## 2021-05-16 DIAGNOSIS — Z1231 Encounter for screening mammogram for malignant neoplasm of breast: Secondary | ICD-10-CM | POA: Diagnosis not present

## 2021-05-16 DIAGNOSIS — Z139 Encounter for screening, unspecified: Secondary | ICD-10-CM

## 2021-05-16 DIAGNOSIS — Z79899 Other long term (current) drug therapy: Secondary | ICD-10-CM | POA: Diagnosis not present

## 2021-06-01 DIAGNOSIS — E041 Nontoxic single thyroid nodule: Secondary | ICD-10-CM | POA: Diagnosis not present

## 2021-08-11 DIAGNOSIS — Z713 Dietary counseling and surveillance: Secondary | ICD-10-CM | POA: Diagnosis not present

## 2021-08-11 DIAGNOSIS — M797 Fibromyalgia: Secondary | ICD-10-CM | POA: Diagnosis not present

## 2021-08-11 DIAGNOSIS — Z6838 Body mass index (BMI) 38.0-38.9, adult: Secondary | ICD-10-CM | POA: Diagnosis not present

## 2021-08-11 DIAGNOSIS — Z299 Encounter for prophylactic measures, unspecified: Secondary | ICD-10-CM | POA: Diagnosis not present

## 2021-08-11 DIAGNOSIS — Z789 Other specified health status: Secondary | ICD-10-CM | POA: Diagnosis not present

## 2021-08-17 DIAGNOSIS — Z6838 Body mass index (BMI) 38.0-38.9, adult: Secondary | ICD-10-CM | POA: Diagnosis not present

## 2021-08-17 DIAGNOSIS — Z299 Encounter for prophylactic measures, unspecified: Secondary | ICD-10-CM | POA: Diagnosis not present

## 2021-08-17 DIAGNOSIS — Z789 Other specified health status: Secondary | ICD-10-CM | POA: Diagnosis not present

## 2021-08-17 DIAGNOSIS — J069 Acute upper respiratory infection, unspecified: Secondary | ICD-10-CM | POA: Diagnosis not present

## 2021-08-17 DIAGNOSIS — Z713 Dietary counseling and surveillance: Secondary | ICD-10-CM | POA: Diagnosis not present

## 2021-11-29 DIAGNOSIS — Z789 Other specified health status: Secondary | ICD-10-CM | POA: Diagnosis not present

## 2021-11-29 DIAGNOSIS — Z299 Encounter for prophylactic measures, unspecified: Secondary | ICD-10-CM | POA: Diagnosis not present

## 2021-11-29 DIAGNOSIS — G4700 Insomnia, unspecified: Secondary | ICD-10-CM | POA: Diagnosis not present

## 2022-02-23 DIAGNOSIS — Z789 Other specified health status: Secondary | ICD-10-CM | POA: Diagnosis not present

## 2022-02-23 DIAGNOSIS — Z299 Encounter for prophylactic measures, unspecified: Secondary | ICD-10-CM | POA: Diagnosis not present

## 2022-02-23 DIAGNOSIS — J45909 Unspecified asthma, uncomplicated: Secondary | ICD-10-CM | POA: Diagnosis not present

## 2022-02-23 DIAGNOSIS — G4700 Insomnia, unspecified: Secondary | ICD-10-CM | POA: Diagnosis not present

## 2022-02-23 DIAGNOSIS — M797 Fibromyalgia: Secondary | ICD-10-CM | POA: Diagnosis not present

## 2022-04-11 DIAGNOSIS — L718 Other rosacea: Secondary | ICD-10-CM | POA: Diagnosis not present

## 2022-04-11 DIAGNOSIS — L249 Irritant contact dermatitis, unspecified cause: Secondary | ICD-10-CM | POA: Diagnosis not present

## 2022-05-18 DIAGNOSIS — Z7189 Other specified counseling: Secondary | ICD-10-CM | POA: Diagnosis not present

## 2022-05-18 DIAGNOSIS — Z0000 Encounter for general adult medical examination without abnormal findings: Secondary | ICD-10-CM | POA: Diagnosis not present

## 2022-05-18 DIAGNOSIS — Z1331 Encounter for screening for depression: Secondary | ICD-10-CM | POA: Diagnosis not present

## 2022-05-18 DIAGNOSIS — Z2821 Immunization not carried out because of patient refusal: Secondary | ICD-10-CM | POA: Diagnosis not present

## 2022-05-18 DIAGNOSIS — Z6836 Body mass index (BMI) 36.0-36.9, adult: Secondary | ICD-10-CM | POA: Diagnosis not present

## 2022-05-18 DIAGNOSIS — Z299 Encounter for prophylactic measures, unspecified: Secondary | ICD-10-CM | POA: Diagnosis not present

## 2022-05-18 DIAGNOSIS — Z1211 Encounter for screening for malignant neoplasm of colon: Secondary | ICD-10-CM | POA: Diagnosis not present

## 2022-05-18 DIAGNOSIS — Z1339 Encounter for screening examination for other mental health and behavioral disorders: Secondary | ICD-10-CM | POA: Diagnosis not present

## 2022-06-11 DIAGNOSIS — I10 Essential (primary) hypertension: Secondary | ICD-10-CM | POA: Diagnosis not present

## 2022-06-11 DIAGNOSIS — J069 Acute upper respiratory infection, unspecified: Secondary | ICD-10-CM | POA: Diagnosis not present

## 2022-06-13 DIAGNOSIS — X32XXXD Exposure to sunlight, subsequent encounter: Secondary | ICD-10-CM | POA: Diagnosis not present

## 2022-06-13 DIAGNOSIS — D045 Carcinoma in situ of skin of trunk: Secondary | ICD-10-CM | POA: Diagnosis not present

## 2022-06-13 DIAGNOSIS — E559 Vitamin D deficiency, unspecified: Secondary | ICD-10-CM | POA: Diagnosis not present

## 2022-06-13 DIAGNOSIS — L570 Actinic keratosis: Secondary | ICD-10-CM | POA: Diagnosis not present

## 2022-06-13 DIAGNOSIS — L821 Other seborrheic keratosis: Secondary | ICD-10-CM | POA: Diagnosis not present

## 2022-06-13 DIAGNOSIS — R5383 Other fatigue: Secondary | ICD-10-CM | POA: Diagnosis not present

## 2022-06-13 DIAGNOSIS — Z79899 Other long term (current) drug therapy: Secondary | ICD-10-CM | POA: Diagnosis not present

## 2022-06-13 DIAGNOSIS — L218 Other seborrheic dermatitis: Secondary | ICD-10-CM | POA: Diagnosis not present

## 2022-06-13 DIAGNOSIS — E7800 Pure hypercholesterolemia, unspecified: Secondary | ICD-10-CM | POA: Diagnosis not present

## 2022-08-16 DIAGNOSIS — R0981 Nasal congestion: Secondary | ICD-10-CM | POA: Diagnosis not present

## 2022-08-16 DIAGNOSIS — J45909 Unspecified asthma, uncomplicated: Secondary | ICD-10-CM | POA: Diagnosis not present

## 2022-08-16 DIAGNOSIS — G4700 Insomnia, unspecified: Secondary | ICD-10-CM | POA: Diagnosis not present

## 2022-08-30 NOTE — Progress Notes (Signed)
This encounter was created in error - please disregard.

## 2022-09-26 DIAGNOSIS — Z08 Encounter for follow-up examination after completed treatment for malignant neoplasm: Secondary | ICD-10-CM | POA: Diagnosis not present

## 2022-09-26 DIAGNOSIS — Z1283 Encounter for screening for malignant neoplasm of skin: Secondary | ICD-10-CM | POA: Diagnosis not present

## 2022-09-26 DIAGNOSIS — L570 Actinic keratosis: Secondary | ICD-10-CM | POA: Diagnosis not present

## 2022-09-26 DIAGNOSIS — Z85828 Personal history of other malignant neoplasm of skin: Secondary | ICD-10-CM | POA: Diagnosis not present

## 2022-09-26 DIAGNOSIS — D225 Melanocytic nevi of trunk: Secondary | ICD-10-CM | POA: Diagnosis not present

## 2022-09-26 DIAGNOSIS — X32XXXD Exposure to sunlight, subsequent encounter: Secondary | ICD-10-CM | POA: Diagnosis not present

## 2022-10-26 DIAGNOSIS — E559 Vitamin D deficiency, unspecified: Secondary | ICD-10-CM | POA: Diagnosis not present

## 2022-10-26 DIAGNOSIS — H5712 Ocular pain, left eye: Secondary | ICD-10-CM | POA: Diagnosis not present

## 2022-10-26 DIAGNOSIS — M797 Fibromyalgia: Secondary | ICD-10-CM | POA: Diagnosis not present

## 2022-10-26 DIAGNOSIS — Z6836 Body mass index (BMI) 36.0-36.9, adult: Secondary | ICD-10-CM | POA: Diagnosis not present

## 2022-10-26 DIAGNOSIS — Z0000 Encounter for general adult medical examination without abnormal findings: Secondary | ICD-10-CM | POA: Diagnosis not present

## 2022-10-26 DIAGNOSIS — Z299 Encounter for prophylactic measures, unspecified: Secondary | ICD-10-CM | POA: Diagnosis not present

## 2022-11-19 DIAGNOSIS — R5383 Other fatigue: Secondary | ICD-10-CM | POA: Diagnosis not present

## 2022-11-19 DIAGNOSIS — H5710 Ocular pain, unspecified eye: Secondary | ICD-10-CM | POA: Diagnosis not present

## 2022-11-19 DIAGNOSIS — G44009 Cluster headache syndrome, unspecified, not intractable: Secondary | ICD-10-CM | POA: Diagnosis not present

## 2022-11-20 DIAGNOSIS — H1132 Conjunctival hemorrhage, left eye: Secondary | ICD-10-CM | POA: Diagnosis not present

## 2022-11-22 DIAGNOSIS — H18513 Endothelial corneal dystrophy, bilateral: Secondary | ICD-10-CM | POA: Diagnosis not present

## 2022-11-22 DIAGNOSIS — R519 Headache, unspecified: Secondary | ICD-10-CM | POA: Diagnosis not present

## 2022-11-22 DIAGNOSIS — B300 Keratoconjunctivitis due to adenovirus: Secondary | ICD-10-CM | POA: Diagnosis not present

## 2022-12-21 DIAGNOSIS — Z049 Encounter for examination and observation for unspecified reason: Secondary | ICD-10-CM | POA: Diagnosis not present

## 2022-12-21 DIAGNOSIS — G43719 Chronic migraine without aura, intractable, without status migrainosus: Secondary | ICD-10-CM | POA: Diagnosis not present

## 2023-01-25 DIAGNOSIS — M797 Fibromyalgia: Secondary | ICD-10-CM | POA: Diagnosis not present

## 2023-01-25 DIAGNOSIS — G4700 Insomnia, unspecified: Secondary | ICD-10-CM | POA: Diagnosis not present

## 2023-01-25 DIAGNOSIS — H5710 Ocular pain, unspecified eye: Secondary | ICD-10-CM | POA: Diagnosis not present

## 2023-05-03 ENCOUNTER — Other Ambulatory Visit: Payer: Self-pay | Admitting: Internal Medicine

## 2023-05-03 DIAGNOSIS — Z1231 Encounter for screening mammogram for malignant neoplasm of breast: Secondary | ICD-10-CM

## 2023-05-14 ENCOUNTER — Ambulatory Visit
Admission: RE | Admit: 2023-05-14 | Discharge: 2023-05-14 | Disposition: A | Discharge status: Home / Self Care | Payer: Medicare HMO | Source: Ambulatory Visit | Attending: Internal Medicine | Admitting: Internal Medicine

## 2023-05-14 DIAGNOSIS — Z1231 Encounter for screening mammogram for malignant neoplasm of breast: Secondary | ICD-10-CM | POA: Diagnosis not present

## 2023-05-24 DIAGNOSIS — Z79899 Other long term (current) drug therapy: Secondary | ICD-10-CM | POA: Diagnosis not present

## 2023-05-24 DIAGNOSIS — Z7189 Other specified counseling: Secondary | ICD-10-CM | POA: Diagnosis not present

## 2023-05-24 DIAGNOSIS — R52 Pain, unspecified: Secondary | ICD-10-CM | POA: Diagnosis not present

## 2023-05-24 DIAGNOSIS — E7800 Pure hypercholesterolemia, unspecified: Secondary | ICD-10-CM | POA: Diagnosis not present

## 2023-05-24 DIAGNOSIS — Z1331 Encounter for screening for depression: Secondary | ICD-10-CM | POA: Diagnosis not present

## 2023-05-24 DIAGNOSIS — Z1339 Encounter for screening examination for other mental health and behavioral disorders: Secondary | ICD-10-CM | POA: Diagnosis not present

## 2023-05-24 DIAGNOSIS — E559 Vitamin D deficiency, unspecified: Secondary | ICD-10-CM | POA: Diagnosis not present

## 2023-05-24 DIAGNOSIS — Z299 Encounter for prophylactic measures, unspecified: Secondary | ICD-10-CM | POA: Diagnosis not present

## 2023-05-24 DIAGNOSIS — Z0000 Encounter for general adult medical examination without abnormal findings: Secondary | ICD-10-CM | POA: Diagnosis not present

## 2023-05-24 DIAGNOSIS — R5383 Other fatigue: Secondary | ICD-10-CM | POA: Diagnosis not present

## 2023-09-06 DIAGNOSIS — M797 Fibromyalgia: Secondary | ICD-10-CM | POA: Diagnosis not present

## 2023-09-06 DIAGNOSIS — B379 Candidiasis, unspecified: Secondary | ICD-10-CM | POA: Diagnosis not present

## 2023-09-06 DIAGNOSIS — N390 Urinary tract infection, site not specified: Secondary | ICD-10-CM | POA: Diagnosis not present

## 2023-09-06 DIAGNOSIS — G4700 Insomnia, unspecified: Secondary | ICD-10-CM | POA: Diagnosis not present

## 2023-11-01 DIAGNOSIS — Z299 Encounter for prophylactic measures, unspecified: Secondary | ICD-10-CM | POA: Diagnosis not present

## 2023-11-01 DIAGNOSIS — R52 Pain, unspecified: Secondary | ICD-10-CM | POA: Diagnosis not present

## 2023-11-01 DIAGNOSIS — Z6834 Body mass index (BMI) 34.0-34.9, adult: Secondary | ICD-10-CM | POA: Diagnosis not present

## 2023-11-01 DIAGNOSIS — Z0000 Encounter for general adult medical examination without abnormal findings: Secondary | ICD-10-CM | POA: Diagnosis not present

## 2023-11-01 DIAGNOSIS — M797 Fibromyalgia: Secondary | ICD-10-CM | POA: Diagnosis not present

## 2023-11-01 DIAGNOSIS — E041 Nontoxic single thyroid nodule: Secondary | ICD-10-CM | POA: Diagnosis not present

## 2023-11-21 DIAGNOSIS — Z1211 Encounter for screening for malignant neoplasm of colon: Secondary | ICD-10-CM | POA: Diagnosis not present

## 2023-11-21 DIAGNOSIS — Z1212 Encounter for screening for malignant neoplasm of rectum: Secondary | ICD-10-CM | POA: Diagnosis not present

## 2023-12-02 DIAGNOSIS — E042 Nontoxic multinodular goiter: Secondary | ICD-10-CM | POA: Diagnosis not present

## 2024-01-03 DIAGNOSIS — G4700 Insomnia, unspecified: Secondary | ICD-10-CM | POA: Diagnosis not present

## 2024-01-03 DIAGNOSIS — R52 Pain, unspecified: Secondary | ICD-10-CM | POA: Diagnosis not present

## 2024-01-03 DIAGNOSIS — R300 Dysuria: Secondary | ICD-10-CM | POA: Diagnosis not present

## 2024-03-03 DIAGNOSIS — R21 Rash and other nonspecific skin eruption: Secondary | ICD-10-CM | POA: Diagnosis not present

## 2024-03-03 DIAGNOSIS — G4700 Insomnia, unspecified: Secondary | ICD-10-CM | POA: Diagnosis not present

## 2024-03-03 DIAGNOSIS — L719 Rosacea, unspecified: Secondary | ICD-10-CM | POA: Diagnosis not present

## 2024-03-25 DIAGNOSIS — Z1283 Encounter for screening for malignant neoplasm of skin: Secondary | ICD-10-CM | POA: Diagnosis not present

## 2024-03-25 DIAGNOSIS — D225 Melanocytic nevi of trunk: Secondary | ICD-10-CM | POA: Diagnosis not present

## 2024-03-25 DIAGNOSIS — Z08 Encounter for follow-up examination after completed treatment for malignant neoplasm: Secondary | ICD-10-CM | POA: Diagnosis not present

## 2024-03-25 DIAGNOSIS — Z85828 Personal history of other malignant neoplasm of skin: Secondary | ICD-10-CM | POA: Diagnosis not present

## 2024-03-25 DIAGNOSIS — L718 Other rosacea: Secondary | ICD-10-CM | POA: Diagnosis not present

## 2024-06-22 DIAGNOSIS — R52 Pain, unspecified: Secondary | ICD-10-CM | POA: Diagnosis not present

## 2024-06-22 DIAGNOSIS — E7800 Pure hypercholesterolemia, unspecified: Secondary | ICD-10-CM | POA: Diagnosis not present

## 2024-06-22 DIAGNOSIS — Z1339 Encounter for screening examination for other mental health and behavioral disorders: Secondary | ICD-10-CM | POA: Diagnosis not present

## 2024-06-22 DIAGNOSIS — Z0000 Encounter for general adult medical examination without abnormal findings: Secondary | ICD-10-CM | POA: Diagnosis not present

## 2024-06-22 DIAGNOSIS — R5383 Other fatigue: Secondary | ICD-10-CM | POA: Diagnosis not present

## 2024-06-22 DIAGNOSIS — Z7189 Other specified counseling: Secondary | ICD-10-CM | POA: Diagnosis not present

## 2024-06-22 DIAGNOSIS — Z299 Encounter for prophylactic measures, unspecified: Secondary | ICD-10-CM | POA: Diagnosis not present

## 2024-06-22 DIAGNOSIS — Z1331 Encounter for screening for depression: Secondary | ICD-10-CM | POA: Diagnosis not present

## 2024-06-22 DIAGNOSIS — E559 Vitamin D deficiency, unspecified: Secondary | ICD-10-CM | POA: Diagnosis not present
